# Patient Record
Sex: Male | Born: 1965 | Race: Asian | Hispanic: No | Marital: Married | Smoking: Never smoker
Health system: Southern US, Community
[De-identification: ages and names within clinical notes are randomized; demographics above are authoritative.]

## PROBLEM LIST (undated history)

## (undated) DIAGNOSIS — I714 Abdominal aortic aneurysm, without rupture, unspecified: Secondary | ICD-10-CM

## (undated) DIAGNOSIS — I10 Essential (primary) hypertension: Secondary | ICD-10-CM

## (undated) DIAGNOSIS — E785 Hyperlipidemia, unspecified: Secondary | ICD-10-CM

## (undated) DIAGNOSIS — N186 End stage renal disease: Secondary | ICD-10-CM

## (undated) DIAGNOSIS — B360 Pityriasis versicolor: Secondary | ICD-10-CM

## (undated) DIAGNOSIS — K5900 Constipation, unspecified: Secondary | ICD-10-CM

## (undated) DIAGNOSIS — Z992 Dependence on renal dialysis: Secondary | ICD-10-CM

## (undated) DIAGNOSIS — E119 Type 2 diabetes mellitus without complications: Secondary | ICD-10-CM

## (undated) DIAGNOSIS — L309 Dermatitis, unspecified: Secondary | ICD-10-CM

## (undated) HISTORY — DX: Essential (primary) hypertension: I10

## (undated) HISTORY — PX: COLONOSCOPY: SHX174

## (undated) HISTORY — DX: Abdominal aortic aneurysm, without rupture: I71.4

## (undated) HISTORY — DX: Pityriasis versicolor: B36.0

## (undated) HISTORY — DX: Abdominal aortic aneurysm, without rupture, unspecified: I71.40

## (undated) HISTORY — DX: Dermatitis, unspecified: L30.9

## (undated) HISTORY — DX: Hyperlipidemia, unspecified: E78.5

## (undated) HISTORY — PX: PARATHYROIDECTOMY: SHX19

---

## 2005-06-14 ENCOUNTER — Inpatient Hospital Stay (HOSPITAL_COMMUNITY): Admission: EM | Admit: 2005-06-14 | Discharge: 2005-06-16 | Payer: Self-pay | Admitting: Emergency Medicine

## 2005-06-15 ENCOUNTER — Ambulatory Visit: Payer: Self-pay | Admitting: Cardiology

## 2005-06-15 ENCOUNTER — Encounter: Payer: Self-pay | Admitting: Cardiology

## 2005-06-25 HISTORY — PX: AV FISTULA PLACEMENT: SHX1204

## 2005-07-19 ENCOUNTER — Ambulatory Visit (HOSPITAL_COMMUNITY): Admission: RE | Admit: 2005-07-19 | Discharge: 2005-07-19 | Payer: Self-pay | Admitting: Vascular Surgery

## 2006-12-20 ENCOUNTER — Ambulatory Visit: Payer: Self-pay | Admitting: Vascular Surgery

## 2007-01-07 ENCOUNTER — Ambulatory Visit (HOSPITAL_COMMUNITY): Admission: RE | Admit: 2007-01-07 | Discharge: 2007-01-07 | Payer: Self-pay | Admitting: Nephrology

## 2008-05-10 ENCOUNTER — Ambulatory Visit: Payer: Self-pay | Admitting: Surgery

## 2008-05-18 ENCOUNTER — Ambulatory Visit (HOSPITAL_COMMUNITY): Admission: RE | Admit: 2008-05-18 | Discharge: 2008-05-18 | Payer: Self-pay | Admitting: Surgery

## 2008-05-18 ENCOUNTER — Ambulatory Visit: Payer: Self-pay | Admitting: Surgery

## 2008-12-07 ENCOUNTER — Encounter: Payer: Self-pay | Admitting: Internal Medicine

## 2008-12-07 ENCOUNTER — Ambulatory Visit (HOSPITAL_BASED_OUTPATIENT_CLINIC_OR_DEPARTMENT_OTHER): Admission: RE | Admit: 2008-12-07 | Discharge: 2008-12-07 | Payer: Self-pay | Admitting: Nephrology

## 2008-12-12 ENCOUNTER — Ambulatory Visit: Payer: Self-pay | Admitting: Internal Medicine

## 2009-01-25 ENCOUNTER — Ambulatory Visit: Payer: Self-pay | Admitting: Internal Medicine

## 2009-01-25 DIAGNOSIS — E785 Hyperlipidemia, unspecified: Secondary | ICD-10-CM

## 2009-01-25 DIAGNOSIS — G4733 Obstructive sleep apnea (adult) (pediatric): Secondary | ICD-10-CM

## 2009-01-30 DIAGNOSIS — I10 Essential (primary) hypertension: Secondary | ICD-10-CM

## 2009-02-02 ENCOUNTER — Encounter: Payer: Self-pay | Admitting: Internal Medicine

## 2009-02-09 ENCOUNTER — Encounter: Payer: Self-pay | Admitting: Internal Medicine

## 2009-02-27 ENCOUNTER — Telehealth: Payer: Self-pay | Admitting: Internal Medicine

## 2009-03-01 ENCOUNTER — Ambulatory Visit: Payer: Self-pay | Admitting: Internal Medicine

## 2009-03-03 ENCOUNTER — Telehealth (INDEPENDENT_AMBULATORY_CARE_PROVIDER_SITE_OTHER): Payer: Self-pay | Admitting: *Deleted

## 2009-03-31 ENCOUNTER — Encounter: Payer: Self-pay | Admitting: Internal Medicine

## 2009-04-28 ENCOUNTER — Ambulatory Visit: Payer: Self-pay | Admitting: Internal Medicine

## 2009-04-28 DIAGNOSIS — N186 End stage renal disease: Secondary | ICD-10-CM | POA: Insufficient documentation

## 2009-06-22 ENCOUNTER — Encounter: Payer: Self-pay | Admitting: Internal Medicine

## 2009-09-06 ENCOUNTER — Encounter: Payer: Self-pay | Admitting: Internal Medicine

## 2009-10-26 ENCOUNTER — Telehealth: Payer: Self-pay | Admitting: Internal Medicine

## 2010-07-16 ENCOUNTER — Encounter: Payer: Self-pay | Admitting: Nephrology

## 2010-07-27 NOTE — Letter (Signed)
Summary: CMN for CPAP Supplies/[facility]  CMN for CPAP Supplies/[facility]   Imported By: Phillis Knack 09/09/2009 11:43:15  _____________________________________________________________________  External Attachment:    Type:   Image     Comment:   External Document

## 2010-07-27 NOTE — Letter (Signed)
Summary: CMN for CPAP Supplies/[facility]  CMN for CPAP Supplies/[facility]   Imported By: Phillis Knack 06/29/2009 07:59:35  _____________________________________________________________________  External Attachment:    Type:   Image     Comment:   External Document

## 2010-07-27 NOTE — Progress Notes (Signed)
Summary: nos appt  Phone Note Call from Patient   Caller: [email or web address]  Call For: Keaghan Bowens Summary of Call: ATC pt to rsc nos from 5/3, phone disconnected no other contact info available. Initial call taken by: Netta Neat,  Oct 26, 2009 10:14 AM

## 2010-11-07 NOTE — Op Note (Signed)
[name], James Brown      ACCOUNT NO.:  [id number]   MEDICAL RECORD NO.:  [id number]          PATIENT TYPE:  AMB   LOCATION:  SDS                          FACILITY:  [facility]   PHYSICIAN:  VAnnamarie Major IV, MDDATE OF BIRTH:  [birth year]   DATE OF PROCEDURE:  05/18/2008  DATE OF DISCHARGE:                               OPERATIVE REPORT   PREOPERATIVE DIAGNOSIS:  End-stage renal disease, right arm aneurysm.   POSTOPERATIVE DIAGNOSIS:  End-stage renal disease, right arm aneurysm.   PROCEDURE PERFORMED:  1. Ultrasound-guided access to the right cephalic vein fistula.  2. Fistulogram.   INDICATIONS:  This is a 45 year old gentleman who has developed aneurysm  near his arterial anastomosis, antecubital crease.  He is also having  trouble with bleeding; therefore, I felt it was prudent to proceed with  fistulogram. The risks and benefits were discussed.  Informed consent  was signed.   PROCEDURE:  The patient was identified in the holding area and taken to  the room where he was placed supine on the table.  The right arm was  prepped and draped in the standard sterile fashion.  A time-out was  called.  Lidocaine 1% was used for local anesthesia. The right-sided  vein fistula was accessed with a micropuncture needle under ultrasound  guidance.  An 0.14 wire was advanced into the vein under fluoroscopic  visualization, and a 5-French micropuncture sheath was placed.  Contrast  injections were then performed through the sheath to evaluate central  venous system, the fistula, and the arteriovenous anastomosis.   FINDINGS:  The site of fistula was widely patent throughout this course.  There was an area of the limb.  No narrowing as the cephalic vein joints  the axillary vein.  This does not appear to be hemodynamically  significant.  Multiple fluoroscopic obliquities were obtained in this  area.  There were no collaterals.  There was no central stenosis.  The  cephalic vein is  ectatic throughout this course with area of aneurysmal  degeneration at the arteriovenous anastomosis.  The arteriovenous  anastomosis was widely patent.   After the above  images were obtained,  a decision made not to perform  any intervention at this time.  Using a pursestring Woggle technique,  the sheath was removed.  The patient was taken to the holding area for  postop observation and removal of the stop-cock.   IMPRESSION:  1. No evidence of central stenosis.  2. Ectatic cephalic vein.  3. Widely patent arteriovenous anastomosis.           ______________________________  V. Leia Alf, MD  Electronically Signed     VWB/MEDQ  D:  05/18/2008  T:  05/19/2008  Job:  [id number]   cc:   Deanna Artis, MD

## 2010-11-07 NOTE — Assessment & Plan Note (Signed)
OFFICE VISIT   James Brown, James Brown  DOB:  Oct 15, 1965                                       05/10/2008  [id number]   REASON FOR VISIT:  Evaluate right arm fistula.   HISTORY:  This is a 45 year old gentleman with end-stage renal disease  who dialyzes through a right upper arm AV fistula placed by Dr. Scot Dock  in January 2007.  The patient has developed aneurysm near the arterial  anastomosis in the antecubital crease.  He states that it is no longer  being accessed in this area as the skin was thinning out.  He is having  some problems with bleeding.   PHYSICAL EXAMINATION:  Vital signs:  His blood pressure is 139/87, his  pulse is 84, he is in no acute distress.  Cardiovascular:  Regular rate,  respirations are nonlabored.  His right arm fistula shows a dilated vein  with aneurysmal degeneration at the antecubital crease.  The skin over  the top of the aneurysm is intact, there is no ulceration, he does have  ecchymosis from a prior access site.   Right arm fistula with aneurysm.   PLAN:  I would like to further evaluate this with a fistulogram to see  if there is any reason or etiology as to why the patient is developing  high pressure in his vein causing him to have aneurysmal degeneration.  At this time, since he does not have any evidence of ulceration or skin  breakdown I am inclined to continue to monitor the aneurysm.  His other  option would be to resect this area and place an interposition graft.  In any event, the graft does not need to be accessed at the site of his  aneurysm.  I am scheduling him for a fistulogram on this coming Tuesday,  November 24th.   Eldridge Abrahams, MD  Electronically Signed   VWB/MEDQ  D:  05/10/2008  T:  05/11/2008  Job:  [id number]   cc:   Jeneen Rinks L. Deterding, M.D.

## 2010-11-10 NOTE — Procedures (Signed)
James Brown, James Brown      ACCOUNT NO.:  [id number]   MEDICAL RECORD NO.:  [id number]          PATIENT TYPE:  OUT   LOCATION:  SLEEP CENTER                 FACILITY:  [facility]   PHYSICIAN:  Clinton D. Annamaria Boots, MD, FCCP, FACPDATE OF BIRTH:  01-Dec-1966   DATE OF STUDY:                            NOCTURNAL POLYSOMNOGRAM   REFERRING PHYSICIAN:  Louis Meckel, M.D.   INDICATION FOR STUDY:  Hypersomnia with sleep apnea.   EPWORTH SLEEPINESS SCORE:  Epworth sleepiness score 20/24.  BMI 32.5.  Weight 214 pounds, height 68 inches.  Neck 17 inches.   MEDICATIONS:  Home medications are charted and reviewed.   SLEEP ARCHITECTURE:  Split study protocol.  During the diagnostic phase,  total sleep time 150 minutes with sleep efficiency 69%.  Stage I was  11.6%, stage II 64.5%, stage III absent, and REM 23.9% of total sleep  time.  Sleep latency 8.5 minutes, REM latency 156.5 minutes, awake-after-  sleep onset 55 minutes, arousal index 86.5 indicating increased EEG  arousal.  No bedtime medication was taken.   RESPIRATORY DATA:  Split study protocol.  Apnea-hypopnea index (AHI)  63.8 per hour.  A total of 160 events was scored including 120  obstructive apneas, 1 mixed apnea, and 39 hypopneas.  Events were seen  in all sleep positions but more common while supine.  REM AHI 75.  CPAP  was then titrated to 20 CWP, AHI 0 per hour.  He wore a medium ResMed  Mirage Quattro full-face mask with heated humidifier.   OXYGEN DATA:  Very loud snoring with oxygen desaturation for CPAP to a  nadir of 73%.  After CPAP control, mean oxygen saturation held 96.2% on  room air.   CARDIAC DATA:  Sinus rhythm with occasional PAC.   MOVEMENT-PARASOMNIA:  Occasional limb jerk without significant effect on  sleep.  Bathroom x1.   IMPRESSIONS-RECOMMENDATIONS:  1. Severe obstructive sleep apnea/hypopnea syndrome, AHI 63.8 per      hour.  Non-positional events more commonly while supine.  Very loud   snoring with oxygen desaturation to a nadir of 73%.  2. Successful CPAP titration to 20 CWP, AHI 0 per hour.  He wore a      medium ResMed Mirage Quattro full-face mask with heated humidifier.      Clinton D. Annamaria Boots, MD, [facility], FACP  Diplomate, Tax adviser of Sleep Medicine  Electronically Signed     CDY/MEDQ  D:  12/11/2008 16:01:24  T:  12/12/2008 00:54:51  Job:  [id number]

## 2010-11-10 NOTE — Consult Note (Signed)
[name], James      ACCOUNT NO.:  [id number]   MEDICAL RECORD NO.:  [id number]          PATIENT TYPE:  INP   LOCATION:  5524                         FACILITY:  [facility]   PHYSICIAN:  Maudie Flakes. Hassell Done, M.D.   DATE OF BIRTH:  Jun 23, 1966   DATE OF CONSULTATION:  06/19/2005  DATE OF DISCHARGE:                                   CONSULTATION   HISTORY OF PRESENT ILLNESS:  Mr. Brown is a 45 year old [place] man  admitted after he was seen in urgent care June 13, 2005.  He was noted  to have marked hypertension and a creatinine of 6.  He was told to go to  [facility] for admission.  He went to urgent care because of  decreased pep an energy as well as DOE.  He thinks this is related to dust  from construction in his house and basement about two weeks ago.  No one  else at home is sick.  No rash, no arthritis, no gross hematuria, no renal  colic.  He has been healthy up until now.  He was given Toprol XL 50 mg a  day and Benicar 20/12.5 on June 13, 2005.   PAST MEDICAL HISTORY:  Unremarkable.  He saw a doctor in [place] four  years ago (Dr. Richardson Landry, phone number [phone]) for a physical.   CURRENT MEDICATIONS:  He takes Tylenol.  No NSAID.   REVIEW OF SYSTEMS:  No angina, no claudication, no melena, no hematochezia,  no foamy urine, no rash, no alopecia, no fever, no hemoptysis, no cold or  heat intolerance.   SOCIAL HISTORY:  He was born in [place].  He came to [place] in [place].  He is a  Programmer, systems.  He moved to [place] in 1988.  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  He has been married for 12  years.  He does not smoke cigarettes or drink alcohol.   FAMILY HISTORY:  His father died at [facility] age 75 of aneurysm-  brain.  His mother is alive and well at age 63.  He has a wife and two  children, a son age 3 and a daughter age 32 and they are healthy.   ALLERGIES:  None.   CURRENT  MEDICATIONS:  1.  Labetalol 100 mg b.i.d.  2.  Lasix 40 mg IV x1.   PHYSICAL EXAMINATION:  VITAL SIGNS:  Temperature 98.3, pulse 81,  respirations 18, blood pressure 120/65, blood pressure had been 174/130 on  June 12, 2005.  HEENT:  Eyes:  Pupils equal, round, and reactive to light.  Nose and mouth:  Moist mucous membranes.  No oral ulcers.  NECK:  Not stiff.  Carotids are 2+ without bruits.  CHEST:  Clear.  HEART:  No rub.  ABDOMEN:  Nontender, no organs or masses are felt.  Bowel sounds are  present.  GU:  Uncircumcised penis.  Testes presented bilaterally.  EXTREMITIES:  No edema, no rash, no arthritis.  NEUROLOGIC:  No flap.  He is ambidextrous.  He writes with his right hand.   LABORATORY DATA:  Urinalysis  by me 2+ protein, 1+ blood, 0-2 red cells,  occasional granular casts, one white cell cast.  Renal ultrasound right  kidney 8.6 cm, left kidney 11.3 cm, increased echogenicity.  Sodium 140,  potassium 4.4, chloride 113, CO2 20, BUN 70, creatinine 6.8, glucose 95.  On  June 13, 2005 BUN 62, creatinine 6.4, cholesterol 303, triglycerides  310, LDL 209, HDL 32.  PT 13.5, PTT 34 seconds.  Hemoglobin 11, WBC 9200,  platelets 259,000.  I called Dr. Horatio Pel and she said that the patient had  a blood pressure of 140/80 and creatinine 1.6 in 2002.  She says that he saw  Dr. Aaron Edelman (nephrologist) at that time.  A copy of Dr. Tawanna Sat consult  has been sent.  It shows that Mr. Chezem had 3.7 G proteinuria/24  __________ in January 2003 and serum creatinine 170.   IMPRESSION:  1.  Proteinuria.  Dipstick positive for blood.  Prior history of nephrotic      range proteinuria.  Increased serum creatinine probable chronic      __________.  2.  Hypertension.  3.  Check 24 hour urine for protein, creatinine, hepatitis B surface antigen      antibody.  Check total CK.  Get records.  Fax from [facility].  May      need renal biopsy check.  __________.  Save left arm for  vascular      access.  4.  Continue slow decrease in blood pressure.  If renal function stabilizes      and blood pressure in better control he could be discharged and come      back if renal biopsy is needed.  No pathology backup until at least      June 19, 2005.           ______________________________  Maudie Flakes. Hassell Done, M.D.     RFF/MEDQ  D:  06/19/2005  T:  06/20/2005  Job:  [id number]   cc:   Richardson Landry  Fax: [phone]   Dr. Aaron Edelman in [place]

## 2010-11-10 NOTE — Op Note (Signed)
NAMEHAORAN, STARWALT      ACCOUNT NO.:  [id number]   MEDICAL RECORD NO.:  [id number]          PATIENT TYPE:  AMB   LOCATION:  SDS                          FACILITY:  [facility]   PHYSICIAN:  Judeth Cornfield. Scot Dock, M.D.DATE OF BIRTH:  [birth year]   DATE OF PROCEDURE:  07/19/2005  DATE OF DISCHARGE:  07/19/2005                                 OPERATIVE REPORT   PREOPERATIVE DIAGNOSIS:  Chronic renal failure.   POSTOPERATIVE DIAGNOSIS:  Chronic renal failure.   PROCEDURE:  Placement of right upper arm AV fistula.   SURGEON:  Scot Dock.   TECHNIQUE:  The patient was taken to the operating room and sedated by  anesthesia. The right upper extremity was prepped and draped in usual  sterile fashion. At the skin was infiltrated with 1% lidocaine, a transverse  incision was made at the antecubital level and here the brachial artery was  dissected free.  It was a good-sized artery, although it did spasm after  being dissected out. The cephalic vein was identified and dissected free.  It was ligated distally and irrigated up nicely with heparinized saline.  It  easily took a 5 mm dilator. The patient was then heparinized. The brachial  artery was clamped proximally and distally and a longitudinal arteriotomy  was made. The vein was mobilized over and sewn end-to-side to the brachial  artery using continuous 6-0 Prolene suture. At completion there was  excellent thrill in the fistula. Hemostasis was obtained in the wound. I did  explore to make sure there were no obvious competing branches proximally.  The wound was then closed with deep layer of 3-0 Vicryl. The skin closed  with 4-0 Vicryl. Sterile dressing was applied. The patient tolerated  procedure well and was transferred to recovery in satisfactory condition.  All needle and sponge counts were correct.      Judeth Cornfield. Scot Dock, M.D.  Electronically Signed     CSD/MEDQ  D:  07/19/2005  T:  07/19/2005  Job:  [id number]

## 2010-11-10 NOTE — Discharge Summary (Signed)
James Brown, James Brown      ACCOUNT NO.:  [id number]   MEDICAL RECORD NO.:  [id number]          PATIENT TYPE:  INP   LOCATION:  [id number]                         FACILITY:  [facility]   PHYSICIAN:  Sherryl Manges, M.D.  DATE OF BIRTH:  September 01, 1965   DATE OF ADMISSION:  06/14/2005  DATE OF DISCHARGE:  06/16/2005                                 DISCHARGE SUMMARY   DISCHARGE DIAGNOSES:  1.  Accelerated hypertension/hypertensive urgency.  2.  Chronic renal failure.  3.  Nephrotic syndrome.   DISCHARGE MEDICATIONS:  1.  Normodyne (labetalol) 200 mg p.o. b.i.d.  2.  Nephro-Vite one pill p.o. every day.  3.  Fosrenol 500 mg p.o. q.a.c. t.i.d.   PROCEDURES:  1.  Chest x-ray, dated June 14, 2005.  This showed cardiac enlargement      with vascular congestion.  2.  Abdominal/pelvic CT scan, dated June 14, 2005.  This showed      bilateral pleural effusions and bibasilar atelectasis versus infiltrate,      atrophic appearing kidneys, no acute upper abdominal abnormality.  There      was nonspecific mild bladder thickening and no other pelvic abnormality.  3.  Renal ultrasound, dated June 15, 2005.  This showed medical renal      disease changes in both kidneys with the right kidney 8.6-cm in length      and left kidney 11.3-cm in length.  Bladder was unremarkable.  Volume      calculated at 240 cc.  There were bilateral pleural effusions, right      greater than left.   CONSULTATIONS:  1.  Richard F. Hassell Done, M.D., nephrologist.  2.  Sol Blazing, M.D., nephrologist.   ADMISSION HISTORY:  As in H&P notes of June 14, 2005.  However, in  brief, this is a rather pleasant 45 year old male, with presumably, no  significant past medical history, who presents with a one week history of  increasing dyspnea on exertion with orthopnea.  He went to the [facility]  urgent care center on June 13, 2005, where they found his blood pressure  to be 174/130-mmHg and physical  features consistent with mild congestive  heart failure.  He also had some electrolytes done.  Creatinine was found to  be >6.  He was referred for admission.  He was therefore subsequently  admitted for further evaluation, investigation, and management.   CLINICAL COURSE:  1.  Malignant hypertension.  This patient presents with accelerated      hypertension, with blood pressures of 174/130 and clinical features      consistent with congestive heart failure/fluid overload.  He was managed      with labetalol and a STAT dose of intravenous Lasix with excellent      clinical response.  Certainly, by June 15, 2005, his blood pressure      had become more reasonable, he had become asymptomatic, with complete      resolution of dyspnea, and on June 16, 2005, his blood pressure was      entirely well controlled at 132/69-mmHg.   1.  Renal failure.  The patient presents with a creatinine of 6.8  and a BUN      of 70 and with clinical features, suggestive of volume overload, as well      as a proteinuria.  It was felt that the patient might be nephrotic, and      that this was possibly acute on chronic renal failure.  Nephrology      consultation was called which was kindly initially provided by Dr.      Salem Senate, who got in touch with the patient's prior nephrologist and      obtained a fax record of the patient's visit to his nephrologist on      July 02, 2001.  At that time, it appears that the patient's 24-hour      urine protein was 3.703 mg and he had a creatinine of 1.7 with a BUN of      22.  At that time, it appeared a renal biopsy was contemplated, however,      the patient failed to follow up and this was the case until his recent      presentation.  Serum protein electrophoresis was done, during the      current admission, and showed a nonspecific pattern.  Renal ultrasound      showed a discrepancy in kidney sizes, with the right kidney appearing      contracted at  8.6-cm length and the left kidney normal at 11.3-cm      length.  This also showed medical renal disease changes bilaterally.      The patient's hepatitis B studies were negative.  He did have a mild      anemia of 11.7.  Iron studies showed an iron of 62, TIBC 270, percentage      saturation 23.  This is unlikely to be iron-deficiency.  It appears to      be more consistent with anemia of chronic disease.  The patient has been      started on a renal diet as well as Nephro-Vite and Fosrenol and after      discussion with Dr. Jonnie Finner in nephrology, the patient may follow up on      an outpatient basis.  He may eventually require dialysis.  We shall      defer this to the nephrologist.  It is possible that renal biopsy may      still be contemplated.  It is noted that 24-hour urinary protein      collection showed a value of 3.74 grams in 24 hours.  The patient's      albumin was 2.9.  These features are consistent with a nephrotic      syndrome.   1.  Dyslipidemia.  The patient had a total cholesterol of 303, triglycerides      310, HDL 32, LDL 209.  TSH is 1.088.  The patient is likely to benefit      from Statin treatment.  We shall therefore initiate Zocor at 20 mg p.o.      q.h.s. for now.   DISPOSITION:  The patient was discharged in satisfactory condition on  June 16, 2005.  He is expected to return to regular duties on June 23, 2005.   DIET:  Renal diet, 18-2-2, with 1200 cc per day fluid restriction.   ACTIVITY:  As tolerated.   WOUND CARE:  Not applicable.   PAIN MANAGEMENT:  Not applicable.   FOLLOWUP INSTRUCTIONS:  1.  The patient is to follow up with Dr. Hassell Done at [facility].  2.  He has been instructed to call for a lab appointment in one week and a      followup appointment in three weeks.  Telephone number has been      supplied.  This is [phone].  All this has been communicated to the      patient.  He has verbalized  understanding.     Sherryl Manges, M.D.  Electronically Signed     CO/MEDQ  D:  06/16/2005  T:  06/16/2005  Job:  [id number]   cc:   Maudie Flakes. Hassell Done, M.D.  Fax: [phone]

## 2010-11-10 NOTE — H&P (Signed)
NAMECELSO, James Brown      ACCOUNT NO.:  [id number]   MEDICAL RECORD NO.:  [id number]          PATIENT TYPE:  EMS   LOCATION:  MAJO                         FACILITY:  [facility]   PHYSICIAN:  Jonna L. Gwynneth Aliment, M.D.DATE OF BIRTH:  02-03-1966   DATE OF ADMISSION:  06/14/2005  DATE OF DISCHARGE:                                HISTORY & PHYSICAL   CHIEF COMPLAINT:  Shortness of breath.   HISTORY:  This is a 45 year old Asian male who was completely healthy until  a week ago when he started to notice dyspnea on exertion, orthopnea, but no  chest pain, blurred vision, or edema.  He went to [facility]  yesterday where they found his blood pressure was 174/130, and he was in  mild congestive heart failure.  His blood tests came back today with a  creatinine of over 6, and he was admitted.   ALLERGIES:  None.   MEDICATIONS:  They gave him Toprol XL 50 and Benicar 20/12.5.   PAST MEDICAL HISTORY:  None.   OPERATIONS:  None.   FAMILY HISTORY:  None.  The patient is adopted.   SOCIAL HISTORY:  Nonsmoker, nondrinker.  No drugs.  Runs a grocery store.  He is married, has a son.   REVIEW OF SYSTEMS:  No chest pain.  He has had nocturia x4 or 5, polydipsia.  Has very foamy urine with bubbles in it.   PHYSICAL EXAMINATION:  VITAL SIGNS:  Temperature 97, pulse 88, respirations  20, blood pressure 128/97, now down to 114/83.  GENERAL:  Well-developed, overweight Asian male.  HEENT:  Normal conjunctivae and lids.  Pupils reactive.  Extraocular  movements full.  Normal hearing and mucosa.  NECK: No mass or thyromegaly.  LUNGS:  Slightly increased respiratory effort.  Lungs have bibasilar  crackles, no dullness.  HEART:  Regular rate and rhythm.  Normal S1 and S2 without murmurs, rubs, or  gallops.  Pulses are without bruits.  There are no cyanosis, clubbing, or  edema.  ABDOMEN:  Nontender.  Normal bowel sounds.  No hepatosplenomegaly or hernia.  EXTERNAL GENITALIA:  Normal.  ADENOPATHY:  None.  NEUROLOGIC: Muscle strength is 5/5 with full range of motion all four  extremities. Cranial nerves are intact.  DTRs are 2+ and equal.  Sensation  is normal.  The patient is alert and oriented x3.  Normal memory, judgment,  and affect.  SKIN:  No rash, lesions, or nodules.   LABORATORY DATA:  BUN 70, creatinine 6.8, potassium 4.4.  BNP 1665.  Hemoglobin 12.  Troponin 0.09.  INR 1.1.   Chest x-ray shows cardiomegaly with vascular congestion, mild right lower  lobe atelectasis.   EKG shows left ventricular hypertrophy.   Total cholesterol is 303, triglycerides 310, HDL 32, LDL 209.  TSH 1.088.   IMPRESSION:  1.  Malignant hypertension.  2.  Acute renal failure.  3.  Hyperlipidemia.   This whole picture looks like nephrotic syndrome.  We will get some renal  workup.  Keep him on labetalol.  Look for secondary causes.  Nephrology has  been consulted and will see him tomorrow.  Jonna L. Gwynneth Aliment, M.D.  Electronically Signed     JLB/MEDQ  D:  06/14/2005  T:  06/16/2005  Job:  [id number]   cc:   [facility]

## 2011-02-21 ENCOUNTER — Encounter: Payer: Self-pay | Admitting: Physician Assistant

## 2011-02-28 ENCOUNTER — Ambulatory Visit (INDEPENDENT_AMBULATORY_CARE_PROVIDER_SITE_OTHER): Payer: Medicare Other | Admitting: Physician Assistant

## 2011-02-28 VITALS — BP 124/88 | HR 112

## 2011-02-28 DIAGNOSIS — N186 End stage renal disease: Secondary | ICD-10-CM

## 2011-02-28 DIAGNOSIS — Z992 Dependence on renal dialysis: Secondary | ICD-10-CM

## 2011-02-28 NOTE — Progress Notes (Signed)
Subjective:     Patient ID: James Brown, male   DOB: Jan 11, 1966, 45 y.o.   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  Fistula was placed in 2007 and has been used without difficulty since that time.  He developed several pseudoaneurysms of this approx 3 years ago.  In 2009 pt was evaled with fistulogram by Dr. Trula Slade.  This was WNL and pt was instructed to have HD stick the AVF above the pseudoaneurysm.   Since his last eval he has developed another pseudoaneurysm and now has a total of 3 large aneurysms present.   He denies bleeding issues with AVF and has HD MWF.  He denies CP, SOB, TIA/CVA sx, and GI issues.  He also denies steal in right UE.     Past Medical History  Diagnosis Date  . Chronic kidney disease   . Hypertension   . Hyperlipidemia   . Dermatitis   . Tinea versicolor   . AAA (abdominal aortic aneurysm)    Past Surgical History  Procedure Date  . Av fistula placement 06/2005    Right Arm     History  Substance Use Topics  . Smoking status: Not on file  . Smokeless tobacco: Not on file  . Alcohol Use:     No family history on file.  No Known Allergies  Current outpatient prescriptions:Calcium Carbonate Antacid (TUMS ULTRA PO), Take by mouth 3 (three) times daily.  , Disp: , Rfl: ;  cinacalcet (SENSIPAR) 90 MG tablet, Take 90 mg by mouth daily.  , Disp: , Rfl: ;  doxycycline (DORYX) 100 MG DR capsule, Take 100 mg by mouth daily.  , Disp: , Rfl: ;  fenofibrate 160 MG tablet, Take 160 mg by mouth daily.  , Disp: , Rfl: ;  GuaiFENesin (MUCINEX PO), Take by mouth as needed.  , Disp: , Rfl:  Lanthanum Carbonate (FOSRENOL PO), Take by mouth 3 (three) times daily before meals.  , Disp: , Rfl: ;  Loratadine (CLARITIN PO), Take by mouth as needed.  , Disp: , Rfl: ;  loratadine (CLARITIN) 10 MG tablet, Take 10 mg by mouth daily.  , Disp: , Rfl: ;  multivitamin (RENA-VIT) TABS tablet, Take 1 tablet by mouth daily.  , Disp: , Rfl: ;   simvastatin (ZOCOR) 80 MG tablet, Take 80 mg by mouth at bedtime.  , Disp: , Rfl:  vardenafil (LEVITRA) 20 MG tablet, Take 20 mg by mouth daily as needed.  , Disp: , Rfl:     Review of Systems  Constitutional: Negative.   HENT: Negative for nosebleeds, congestion and trouble swallowing.   Eyes: Negative.   Respiratory: Negative.   Cardiovascular: Negative.   Gastrointestinal: Negative.   Genitourinary: Negative for dysuria, hematuria and difficulty urinating.  Musculoskeletal: Negative.   Neurological: Negative.   Hematological: Negative.   Psychiatric/Behavioral: Negative for behavioral problems, confusion and agitation.       Objective:   Physical Exam  Constitutional: He is oriented to person, place, and time. He appears well-developed and well-nourished. No distress.  HENT:  Head: Normocephalic and atraumatic.  Eyes: EOM are normal. Pupils are equal, round, and reactive to light.  Neck: Normal range of motion. Neck supple.  Cardiovascular: Normal rate and regular rhythm.   Pulmonary/Chest: Effort normal and breath sounds normal. He has no wheezes.  Abdominal: Soft. Bowel sounds are normal. He exhibits no distension. There is no tenderness.  Musculoskeletal: Normal range of motion.  2+ bilateral radial pulses M/S intact B UE +thrill right upper arm AVF; 3 large pseudoaneurysms present from anticub space to prox fistula; middle one has area of thin skin; prox two are still being stuck on HD; no bleeding noted  Neurological: He is alert and oriented to person, place, and time.  Skin: Skin is warm and dry. No rash noted.  Psychiatric: He has a normal mood and affect. His behavior is normal. Judgment normal.       Assessment:    ESRD with AVF with multiple pseudoaneurysms and no new areas to stick for HD; one aneurysm with thinning skin    Plan:    F/U 1-2 weeks on Tues or Thurs for left UE vein mapping and MD f/u to discuss new HD access and ligation of right AVF.

## 2011-03-27 LAB — POCT I-STAT, CHEM 8
Calcium, Ion: 0.97 — ABNORMAL LOW
Chloride: 105
HCT: 38 — ABNORMAL LOW
Potassium: 3.8

## 2011-04-02 ENCOUNTER — Encounter: Payer: Self-pay | Admitting: Vascular Surgery

## 2011-04-03 ENCOUNTER — Ambulatory Visit: Payer: Medicare Other | Admitting: Vascular Surgery

## 2011-04-03 ENCOUNTER — Other Ambulatory Visit: Payer: Medicare Other

## 2011-04-16 ENCOUNTER — Encounter: Payer: Self-pay | Admitting: Surgery

## 2011-04-16 ENCOUNTER — Ambulatory Visit (INDEPENDENT_AMBULATORY_CARE_PROVIDER_SITE_OTHER): Payer: Medicare Other | Admitting: Surgery

## 2011-04-16 ENCOUNTER — Ambulatory Visit (INDEPENDENT_AMBULATORY_CARE_PROVIDER_SITE_OTHER): Payer: Medicare Other | Admitting: *Deleted

## 2011-04-16 VITALS — BP 111/73 | HR 105 | Resp 16 | Ht 67.0 in | Wt 235.0 lb

## 2011-04-16 DIAGNOSIS — N186 End stage renal disease: Secondary | ICD-10-CM

## 2011-04-16 DIAGNOSIS — T859XXA Unspecified complication of internal prosthetic device, implant and graft, initial encounter: Secondary | ICD-10-CM

## 2011-04-16 DIAGNOSIS — T82898A Other specified complication of vascular prosthetic devices, implants and grafts, initial encounter: Secondary | ICD-10-CM

## 2011-04-16 DIAGNOSIS — Z48812 Encounter for surgical aftercare following surgery on the circulatory system: Secondary | ICD-10-CM

## 2011-04-16 DIAGNOSIS — Z992 Dependence on renal dialysis: Secondary | ICD-10-CM

## 2011-04-16 NOTE — Progress Notes (Signed)
[facility]   Patient name: James Brown MRN: [id number] DOB: [birth year] Sex: male   Referred by: Dr. Detterding  Reason for referral:  Chief Complaint  Patient presents with  . Aneurysm    Right upper arm AVF, Placed in 2007   REF -> Dr. Jimmy Footman ,  Fistula duplex done today.    HISTORY OF PRESENT ILLNESS: The patient comes in today for evaluation of 3 aneurysmal areas in his right brachiocephalic fistula which was created in 2009. The patient states that his fistula was running well however he is having issues with the size of the vein. He wants to discuss options for repair. He denies having any swelling in that arm he denies having any pain in that arm. He denies having any active ulcerations over the fistula.  The patient  Past Medical History  Diagnosis Date  . Chronic kidney disease   . Hypertension   . Hyperlipidemia   . Dermatitis   . Tinea versicolor   . AAA (abdominal aortic aneurysm)     Past Surgical History  Procedure Date  . Av fistula placement 06/2005    Right Arm    History   Social History  . Marital Status: Married    Spouse Name: N/A    Number of Children: N/A  . Years of Education: N/A   Occupational History  . Not on file.   Social History Main Topics  . Smoking status: Never Smoker   . Smokeless tobacco: Not on file  . Alcohol Use: No  . Drug Use: No  . Sexually Active: Not on file   Other Topics Concern  . Not on file   Social History Narrative  . No narrative on file    No family history on file.  Allergies as of 04/16/2011  . (No Known Allergies)    Current Outpatient Prescriptions on File Prior to Visit  Medication Sig Dispense Refill  . Calcium Carbonate Antacid (TUMS ULTRA PO) Take by mouth 3 (three) times daily.        . cinacalcet (SENSIPAR) 90 MG tablet Take 90 mg by mouth daily.        Marland Kitchen doxycycline (DORYX) 100 MG DR capsule Take 100 mg by mouth daily.        . fenofibrate  160 MG tablet Take 160 mg by mouth daily.        . GuaiFENesin (MUCINEX PO) Take by mouth as needed.        . Lanthanum Carbonate (FOSRENOL PO) Take by mouth 3 (three) times daily before meals.        Marland Kitchen loratadine (CLARITIN) 10 MG tablet Take 10 mg by mouth daily.        . multivitamin (RENA-VIT) TABS tablet Take 1 tablet by mouth daily.        . simvastatin (ZOCOR) 80 MG tablet Take 80 mg by mouth at bedtime.        . vardenafil (LEVITRA) 20 MG tablet Take 20 mg by mouth daily as needed.           REVIEW OF SYSTEMS: Negative except as dictated in the history of present illness  PHYSICAL EXAMINATION: General: The patient appears their stated age.  Vital signs are BP 111/73  Pulse 105  Resp 16  Ht 5\' 7"  (1.702 m)  Wt 235 lb (106.595 kg)  BMI 36.81 kg/m2 Pulmonary: There is a good air exchange bilaterally Musculoskeletal: There are no major deformities.  There is no  significant extremity pain. Neurologic: No focal weakness or paresthesias are detected, Skin: There are no ulcer or rashes noted. Psychiatric: The patient has normal affect. Cardiovascular: There is a regular rate and rhythm without significant murmur appreciated. There are 3 aneurysmal areas on his fistula. 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 This would most likely need to be done in a staged fashion in order to avoid having to place a temporary dialysis catheter. The patient states that he would only like to have the one done at his antecubital crease which is the most distal  one. After discussing my plans for the operation I did mention that there was the possibility that manipulation of his fistula in repairing his aneurysms could cause the fistula to occlude. The patient did not realize this was a possibility and he would like to think about this before he proceeds. He will contact my office if he wishes to get this done the procedure would be aneurysmorrhaphy of the most distal AV fistula aneurysm at the level of the antecubital crease and incorporating the arteriovenous anastomosis   V. Leia Alf, M.D. [facility] [place] Office: [phone]

## 2011-05-07 NOTE — Procedures (Unsigned)
VASCULAR LAB EXAM  INDICATION:  Enlarged aneurysms in right brachiocephalic AV fistula.  HISTORY: Diabetes: Cardiac: Hypertension:  EXAM:  Duplex of right brachiocephalic AV fistula reveals mild disease in the proximal portion of the graft near the anastomosis. There are 3 large aneurysms measuring 2.94 cm, 3.6 cm, 2.66 cm.  Between the second and third aneurysm is an area of narrowing with increased velocities, however, no discernible plaque is present.  IMPRESSION: 1. Patent right aneurysmal arteriovenous fistula as described above. 2. Please see attached worksheet for details.  ___________________________________________ V. Leia Alf, MD  LT/MEDQ  D:  04/16/2011  T:  04/16/2011  Job:  [id number]

## 2011-07-20 ENCOUNTER — Encounter: Payer: Self-pay | Admitting: Surgery

## 2011-07-23 ENCOUNTER — Encounter: Payer: Self-pay | Admitting: Surgery

## 2011-07-23 ENCOUNTER — Ambulatory Visit (INDEPENDENT_AMBULATORY_CARE_PROVIDER_SITE_OTHER): Payer: Medicare Other | Admitting: Surgery

## 2011-07-23 VITALS — BP 104/70 | HR 112 | Temp 98.1°F | Resp 16 | Ht 67.0 in | Wt 233.0 lb

## 2011-07-23 DIAGNOSIS — T82898A Other specified complication of vascular prosthetic devices, implants and grafts, initial encounter: Secondary | ICD-10-CM | POA: Insufficient documentation

## 2011-07-23 DIAGNOSIS — N186 End stage renal disease: Secondary | ICD-10-CM

## 2011-07-23 NOTE — Progress Notes (Signed)
Vascular and Vein Specialist of [facility]   Patient name: James Brown MRN: [id number] DOB: 06-18-1966 Sex: male     Chief Complaint  Patient presents with  . Aneurysm    Right AVF aneurysm per Dr. Jimmy Footman    HISTORY OF PRESENT ILLNESS: The patient comes in today for followup of his aneurysmal fistula. His fistula was placed in 2009. The fistula was running well however he has issues with the size. We discussed proceeding with repair in October however I told him that one of the risks might be occlusion of the fistula and he wished to think about it. He's back today stating that he wants the operation. There is been no ulcerations on the skin he is not having issues with his flow rate.  Past Medical History  Diagnosis Date  . Chronic kidney disease   . Hypertension   . Hyperlipidemia   . Dermatitis   . Tinea versicolor   . AAA (abdominal aortic aneurysm)     Past Surgical History  Procedure Date  . Av fistula placement 06/2005    Right Arm    History   Social History  . Marital Status: Married    Spouse Name: N/A    Number of Children: N/A  . Years of Education: N/A   Occupational History  . Not on file.   Social History Main Topics  . Smoking status: Never Smoker   . Smokeless tobacco: Not on file  . Alcohol Use: No  . Drug Use: No  . Sexually Active: Not on file   Other Topics Concern  . Not on file   Social History Narrative  . No narrative on file    No family history on file.  Allergies as of 07/23/2011  . (No Known Allergies)    Current Outpatient Prescriptions on File Prior to Visit  Medication Sig Dispense Refill  . Calcium Carbonate Antacid (TUMS ULTRA PO) Take by mouth 3 (three) times daily.        . cinacalcet (SENSIPAR) 90 MG tablet Take 90 mg by mouth daily.        Marland Kitchen doxycycline (DORYX) 100 MG DR capsule Take 100 mg by mouth daily.        . fenofibrate 160 MG tablet Take 160 mg by mouth daily.        . GuaiFENesin (MUCINEX PO)  Take by mouth as needed.        . Lanthanum Carbonate (FOSRENOL PO) Take by mouth 3 (three) times daily before meals.        Marland Kitchen loratadine (CLARITIN) 10 MG tablet Take 10 mg by mouth daily.        . multivitamin (RENA-VIT) TABS tablet Take 1 tablet by mouth daily.        . simvastatin (ZOCOR) 80 MG tablet Take 80 mg by mouth at bedtime.        . vardenafil (LEVITRA) 20 MG tablet Take 20 mg by mouth daily as needed.           REVIEW OF SYSTEMS: No change from prior visit  PHYSICAL EXAMINATION:   Vital signs are BP 104/70  Pulse 112  Temp(Src) 98.1 F (36.7 C) (Oral)  Resp 16  Ht 5\' 7"  (1.702 m)  Wt 233 lb (105.688 kg)  BMI 36.49 kg/m2 General: The patient appears their stated age. HEENT:  No gross abnormalities Pulmonary:  Non labored breathing  Musculoskeletal: There are no major deformities. Neurologic: No focal weakness or paresthesias are detected, Skin: There  are no ulcer or rashes noted. Psychiatric: The patient has normal affect. Cardiovascular: Aneurysmal changes within the fistula no ulceration   Diagnostic Studies None  Assessment: Aneurysmal right brachiocephalic fistula Plan: I will plan on performing aneurysmorrhaphy of the aneurysmal segment near the arteriovenous anastomosis. There are additional aneurysmal areas however I do not think they can all be addressed in one operation without having to place a dialysis catheter. We have agreed to only address the 1 near the arteriovenous anastomosis. The risks and benefits were discussed with the patient. He wishes to proceed. His operation has been scheduled for Thursday, February 7   V. Leia Alf, M.D. [facility] [place] Office: [phone] Pager:  [phone]

## 2011-07-25 ENCOUNTER — Other Ambulatory Visit: Payer: Self-pay

## 2011-07-31 ENCOUNTER — Encounter (HOSPITAL_COMMUNITY): Payer: Self-pay

## 2011-08-01 MED ORDER — SODIUM CHLORIDE 0.9 % IV SOLN
INTRAVENOUS | Status: DC
  Administered 2011-08-02: 09:00:00 via INTRAVENOUS

## 2011-08-01 MED ORDER — CEFAZOLIN SODIUM-DEXTROSE 2-3 GM-% IV SOLR
2.0000 g | INTRAVENOUS | Status: AC
  Administered 2011-08-02: 2 g via INTRAVENOUS
  Filled 2011-08-01: qty 50

## 2011-08-02 ENCOUNTER — Encounter (HOSPITAL_COMMUNITY)
Admission: RE | Disposition: A | Discharge status: Home / Self Care | Payer: Self-pay | Source: Ambulatory Visit | Attending: Surgery

## 2011-08-02 ENCOUNTER — Ambulatory Visit (HOSPITAL_COMMUNITY)
Admission: RE | Admit: 2011-08-02 | Discharge: 2011-08-02 | Disposition: A | Discharge status: Home / Self Care | Payer: Medicare Other | Source: Ambulatory Visit | Attending: Surgery | Admitting: Surgery

## 2011-08-02 ENCOUNTER — Other Ambulatory Visit: Payer: Self-pay

## 2011-08-02 ENCOUNTER — Encounter (HOSPITAL_COMMUNITY): Payer: Self-pay | Admitting: Anesthesiology

## 2011-08-02 ENCOUNTER — Ambulatory Visit (HOSPITAL_COMMUNITY): Payer: Medicare Other | Admitting: Anesthesiology

## 2011-08-02 ENCOUNTER — Ambulatory Visit (HOSPITAL_COMMUNITY): Payer: Medicare Other

## 2011-08-02 DIAGNOSIS — T82898A Other specified complication of vascular prosthetic devices, implants and grafts, initial encounter: Secondary | ICD-10-CM

## 2011-08-02 DIAGNOSIS — N186 End stage renal disease: Secondary | ICD-10-CM

## 2011-08-02 DIAGNOSIS — I120 Hypertensive chronic kidney disease with stage 5 chronic kidney disease or end stage renal disease: Secondary | ICD-10-CM | POA: Insufficient documentation

## 2011-08-02 DIAGNOSIS — G4730 Sleep apnea, unspecified: Secondary | ICD-10-CM | POA: Insufficient documentation

## 2011-08-02 DIAGNOSIS — Y849 Medical procedure, unspecified as the cause of abnormal reaction of the patient, or of later complication, without mention of misadventure at the time of the procedure: Secondary | ICD-10-CM | POA: Insufficient documentation

## 2011-08-02 LAB — POCT I-STAT 4, (NA,K, GLUC, HGB,HCT): Sodium: 141 mEq/L (ref 135–145)

## 2011-08-02 LAB — SURGICAL PCR SCREEN
MRSA, PCR: NEGATIVE
Staphylococcus aureus: NEGATIVE

## 2011-08-02 SURGERY — RESECTION OF ARTERIOVENOUS FISTULA ANEURYSM
Anesthesia: Monitor Anesthesia Care | Site: Arm Upper | Laterality: Right | Wound class: Clean

## 2011-08-02 MED ORDER — LIDOCAINE-EPINEPHRINE (PF) 1 %-1:200000 IJ SOLN
INTRAMUSCULAR | Status: DC | PRN
  Administered 2011-08-02: 30 mL

## 2011-08-02 MED ORDER — SODIUM CHLORIDE 0.9 % IR SOLN
Status: DC | PRN
  Administered 2011-08-02: 10:00:00

## 2011-08-02 MED ORDER — MIDAZOLAM HCL 5 MG/5ML IJ SOLN
INTRAMUSCULAR | Status: DC | PRN
  Administered 2011-08-02: 2 mg via INTRAVENOUS

## 2011-08-02 MED ORDER — OXYCODONE HCL 5 MG PO TABS: ORAL_TABLET | ORAL | Status: DC

## 2011-08-02 MED ORDER — PHENYLEPHRINE HCL 10 MG/ML IJ SOLN
INTRAMUSCULAR | Status: DC | PRN
  Administered 2011-08-02: 80 ug via INTRAVENOUS

## 2011-08-02 MED ORDER — FENTANYL CITRATE 0.05 MG/ML IJ SOLN
INTRAMUSCULAR | Status: DC | PRN
  Administered 2011-08-02 (×2): 50 ug via INTRAVENOUS

## 2011-08-02 MED ORDER — ONDANSETRON HCL 4 MG/2ML IJ SOLN: 4.0000 mg | Freq: Four times a day (QID) | INTRAMUSCULAR | Status: DC | PRN

## 2011-08-02 MED ORDER — ONDANSETRON HCL 4 MG/2ML IJ SOLN
INTRAMUSCULAR | Status: DC | PRN
  Administered 2011-08-02: 4 mg via INTRAVENOUS

## 2011-08-02 MED ORDER — FENTANYL CITRATE 0.05 MG/ML IJ SOLN: 25.0000 ug | INTRAMUSCULAR | Status: DC | PRN

## 2011-08-02 MED ORDER — PROPOFOL 10 MG/ML IV EMUL
INTRAVENOUS | Status: DC | PRN
  Administered 2011-08-02: 100 ug/kg/min via INTRAVENOUS

## 2011-08-02 MED ORDER — 0.9 % SODIUM CHLORIDE (POUR BTL) OPTIME
TOPICAL | Status: DC | PRN
  Administered 2011-08-02: 1000 mL

## 2011-08-02 MED ORDER — LIDOCAINE-EPINEPHRINE (PF) 1 %-1:200000 IJ SOLN
INTRAMUSCULAR | Status: DC | PRN
  Administered 2011-08-02: 9 mL

## 2011-08-02 MED ORDER — SODIUM CHLORIDE 0.9 % IV SOLN
INTRAVENOUS | Status: DC | PRN
  Administered 2011-08-02: 08:00:00 via INTRAVENOUS

## 2011-08-02 MED ORDER — MUPIROCIN 2 % EX OINT
TOPICAL_OINTMENT | CUTANEOUS | Status: AC
  Administered 2011-08-02: 1 via NASAL
  Filled 2011-08-02: qty 22

## 2011-08-02 SURGICAL SUPPLY — 38 items
ADH SKN CLS APL DERMABOND .7 (GAUZE/BANDAGES/DRESSINGS) ×1
CANISTER SUCTION 2500CC (MISCELLANEOUS) ×2 IMPLANT
CLIP TI MEDIUM 6 (CLIP) ×2 IMPLANT
CLIP TI WIDE RED SMALL 6 (CLIP) ×2 IMPLANT
CLOTH BEACON ORANGE TIMEOUT ST (SAFETY) ×2 IMPLANT
COVER PROBE W GEL 5X96 (DRAPES) ×2 IMPLANT
COVER SURGICAL LIGHT HANDLE (MISCELLANEOUS) ×4 IMPLANT
DERMABOND ADVANCED (GAUZE/BANDAGES/DRESSINGS) ×1
DERMABOND ADVANCED .7 DNX12 (GAUZE/BANDAGES/DRESSINGS) ×1 IMPLANT
ELECT REM PT RETURN 9FT ADLT (ELECTROSURGICAL) ×2
ELECTRODE REM PT RTRN 9FT ADLT (ELECTROSURGICAL) ×1 IMPLANT
GLOVE BIO SURGEON STRL SZ 6.5 (GLOVE) ×1 IMPLANT
GLOVE BIO SURGEON STRL SZ8 (GLOVE) ×1 IMPLANT
GLOVE BIOGEL PI IND STRL 7.0 (GLOVE) IMPLANT
GLOVE BIOGEL PI IND STRL 7.5 (GLOVE) ×1 IMPLANT
GLOVE BIOGEL PI INDICATOR 7.0 (GLOVE) ×2
GLOVE BIOGEL PI INDICATOR 7.5 (GLOVE) ×2
GLOVE SURG SS PI 7.5 STRL IVOR (GLOVE) ×3 IMPLANT
GOWN PREVENTION PLUS XLARGE (GOWN DISPOSABLE) ×1 IMPLANT
GOWN PREVENTION PLUS XXLARGE (GOWN DISPOSABLE) ×2 IMPLANT
GOWN STRL NON-REIN LRG LVL3 (GOWN DISPOSABLE) ×4 IMPLANT
HEMOSTAT SNOW SURGICEL 2X4 (HEMOSTASIS) IMPLANT
HEMOSTAT SURGICEL 2X14 (HEMOSTASIS) IMPLANT
KIT BASIN OR (CUSTOM PROCEDURE TRAY) ×2 IMPLANT
KIT ROOM TURNOVER OR (KITS) ×2 IMPLANT
NS IRRIG 1000ML POUR BTL (IV SOLUTION) ×2 IMPLANT
PACK CV ACCESS (CUSTOM PROCEDURE TRAY) ×2 IMPLANT
PAD ARMBOARD 7.5X6 YLW CONV (MISCELLANEOUS) ×4 IMPLANT
SUT PROLENE 5 0 C 1 24 (SUTURE) ×4 IMPLANT
SUT PROLENE 6 0 BV (SUTURE) ×1 IMPLANT
SUT PROLENE 6 0 CC (SUTURE) ×2 IMPLANT
SUT VIC AB 3-0 SH 27 (SUTURE) ×4
SUT VIC AB 3-0 SH 27X BRD (SUTURE) ×1 IMPLANT
SUT VICRYL 4-0 PS2 18IN ABS (SUTURE) IMPLANT
TOWEL OR 17X24 6PK STRL BLUE (TOWEL DISPOSABLE) ×2 IMPLANT
TOWEL OR 17X26 10 PK STRL BLUE (TOWEL DISPOSABLE) ×2 IMPLANT
UNDERPAD 30X30 INCONTINENT (UNDERPADS AND DIAPERS) ×2 IMPLANT
WATER STERILE IRR 1000ML POUR (IV SOLUTION) ×2 IMPLANT

## 2011-08-02 NOTE — Anesthesia Postprocedure Evaluation (Signed)
Anesthesia Post Note  Patient: James Brown  Procedure(s) Performed:  RESECTION OF ARTERIOVENOUS FISTULA ANEURYSM - Resection right arteriovenous fistula aneurysm  Anesthesia type: General  Patient location: PACU  Post pain: Pain level controlled and Adequate analgesia  Post assessment: Post-op Vital signs reviewed, Patient's Cardiovascular Status Stable, Respiratory Function Stable, Patent Airway and Pain level controlled  Last Vitals:  Filed Vitals:   08/02/11 1115  BP:   Pulse: 81  Temp: 36.1 C  Resp: 11    Post vital signs: Reviewed and stable  Level of consciousness: awake, alert  and oriented  Complications: No apparent anesthesia complications

## 2011-08-02 NOTE — Preoperative (Signed)
Beta Blockers   Reason not to administer Beta Blockers:Not Applicable 

## 2011-08-02 NOTE — Transfer of Care (Signed)
Immediate Anesthesia Transfer of Care Note  Patient: James Brown  Procedure(s) Performed:  RESECTION OF ARTERIOVENOUS FISTULA ANEURYSM - Resection right arteriovenous fistula aneurysm  Patient Location: PACU  Anesthesia Type: MAC  Level of Consciousness: awake, alert , oriented and patient cooperative  Airway & Oxygen Therapy: Patient Spontanous Breathing and Patient connected to face mask oxygen  Post-op Assessment: Report given to PACU RN, Post -op Vital signs reviewed and stable and Patient moving all extremities  Post vital signs: Reviewed and stable  Complications: No apparent anesthesia complications

## 2011-08-02 NOTE — H&P (View-Only) (Signed)
Vascular and Vein Specialist of [facility]   Patient name: James Brown MRN: [id number] DOB: Jan 20, 1966 Sex: male     Chief Complaint  Patient presents with  . Aneurysm    Right AVF aneurysm per Dr. Jimmy Footman    HISTORY OF PRESENT ILLNESS: The patient comes in today for followup of his aneurysmal fistula. His fistula was placed in 2009. The fistula was running well however he has issues with the size. We discussed proceeding with repair in October however I told him that one of the risks might be occlusion of the fistula and he wished to think about it. He's back today stating that he wants the operation. There is been no ulcerations on the skin he is not having issues with his flow rate.  Past Medical History  Diagnosis Date  . Chronic kidney disease   . Hypertension   . Hyperlipidemia   . Dermatitis   . Tinea versicolor   . AAA (abdominal aortic aneurysm)     Past Surgical History  Procedure Date  . Av fistula placement 06/2005    Right Arm    History   Social History  . Marital Status: Married    Spouse Name: N/A    Number of Children: N/A  . Years of Education: N/A   Occupational History  . Not on file.   Social History Main Topics  . Smoking status: Never Smoker   . Smokeless tobacco: Not on file  . Alcohol Use: No  . Drug Use: No  . Sexually Active: Not on file   Other Topics Concern  . Not on file   Social History Narrative  . No narrative on file    No family history on file.  Allergies as of 07/23/2011  . (No Known Allergies)    Current Outpatient Prescriptions on File Prior to Visit  Medication Sig Dispense Refill  . Calcium Carbonate Antacid (TUMS ULTRA PO) Take by mouth 3 (three) times daily.        . cinacalcet (SENSIPAR) 90 MG tablet Take 90 mg by mouth daily.        Marland Kitchen doxycycline (DORYX) 100 MG DR capsule Take 100 mg by mouth daily.        . fenofibrate 160 MG tablet Take 160 mg by mouth daily.        . GuaiFENesin (MUCINEX PO)  Take by mouth as needed.        . Lanthanum Carbonate (FOSRENOL PO) Take by mouth 3 (three) times daily before meals.        Marland Kitchen loratadine (CLARITIN) 10 MG tablet Take 10 mg by mouth daily.        . multivitamin (RENA-VIT) TABS tablet Take 1 tablet by mouth daily.        . simvastatin (ZOCOR) 80 MG tablet Take 80 mg by mouth at bedtime.        . vardenafil (LEVITRA) 20 MG tablet Take 20 mg by mouth daily as needed.           REVIEW OF SYSTEMS: No change from prior visit  PHYSICAL EXAMINATION:   Vital signs are BP 104/70  Pulse 112  Temp(Src) 98.1 F (36.7 C) (Oral)  Resp 16  Ht 5\' 7"  (1.702 m)  Wt 233 lb (105.688 kg)  BMI 36.49 kg/m2 General: The patient appears their stated age. HEENT:  No gross abnormalities Pulmonary:  Non labored breathing  Musculoskeletal: There are no major deformities. Neurologic: No focal weakness or paresthesias are detected, Skin: There  are no ulcer or rashes noted. Psychiatric: The patient has normal affect. Cardiovascular: Aneurysmal changes within the fistula no ulceration   Diagnostic Studies None  Assessment: Aneurysmal right brachiocephalic fistula Plan: I will plan on performing aneurysmorrhaphy of the aneurysmal segment near the arteriovenous anastomosis. There are additional aneurysmal areas however I do not think they can all be addressed in one operation without having to place a dialysis catheter. We have agreed to only address the 1 near the arteriovenous anastomosis. The risks and benefits were discussed with the patient. He wishes to proceed. His operation has been scheduled for Thursday, February 7   V. Leia Alf, M.D. [facility] [place] Office: [phone] Pager:  [phone]

## 2011-08-02 NOTE — Op Note (Signed)
OPERATIVE REPORT  Date of Surgery: 08/02/2011  Surgeon: [name], MD  Assistant: Wray Kearns, PA Pre-op Diagnosis: End Stage Renal Diseasewith the aneurysms right upper arm AV fistula Post-op Diagnosis: side Procedure: Procedure(s): RESECTION OF ARTERIOVENOUS FISTULA ANEURYSM right upper arm,( brachial-cephalic) with aneurysmorrhaphy Anesthesia: Matt  EBL: minimal  Complications: None  Procedure Details:is resected to the operative reports in the supine position at which the right upper extremity was prepped with Betadine scrub and solution draped in routine sterile manner. There was a nicely functioning AV fistula in the right upper arm (brachial-cephalic) which had 3 areas of aneurysmal dilatation. The largest and most significant was the one extending from near the arterial anastomosis about a far away proximally up the arm. Was about 10-15 cm in length and about 3-4 cm in diameter. After infiltration with 1% Xylocaine an elliptical incision was made over this long area to excise redundant skin. This was carried down through subcutaneous tissue using the scalpel down through the aneurysm itself. The elliptical skin was excised. The aneurysm was then mobilized proximally and distally for control and laterally in order to excise a portion of the anterior wall. Following this the the fistula was occluded proximally without interrupting flow to the distal upper extremity and distally with vascular clamps. About 50% of the anterior wall of the fistula was excised in an elliptical fashion leaving a satisfactory lumen. There were no patent side branches. The anterior portion of the fistula was then closed using a continuous 5-0 Prolene suture leaving a quite satisfactory lumen. Following appropriate flushing as well as the clamps with good size match of the gland proximally and distally. Adequate hemostasis was achieved and the wound closed in layers with Vicryl in subcuticular fashion. There were  2 further aneurysms more proximally in the arm which will have to be treated at a later date so that there was an area for accessing the gland for hemodialysis. There was a good radial pulse palpable distally. Patient taken to the recovery room in satisfactory condition.   [name], MD 08/02/2011 10:49 AM

## 2011-08-02 NOTE — Anesthesia Preprocedure Evaluation (Addendum)
Anesthesia Evaluation  Patient identified by MRN, date of birth, ID band Patient awake    Reviewed: Allergy & Precautions, H&P , NPO status , Patient's Chart, lab work & pertinent test results, reviewed documented beta blocker date and time   Airway Mallampati: II  Neck ROM: full    Dental  (+) Teeth Intact and Dental Advisory Given   Pulmonary sleep apnea ,          Cardiovascular hypertension,     Neuro/Psych    GI/Hepatic   Endo/Other    Renal/GU ESRFRenal disease     Musculoskeletal   Abdominal   Peds  Hematology   Anesthesia Other Findings   Reproductive/Obstetrics                          Anesthesia Physical Anesthesia Plan  ASA: III  Anesthesia Plan: General and MAC   Post-op Pain Management:    Induction: Intravenous  Airway Management Planned: Simple Face Mask  Additional Equipment:   Intra-op Plan:   Post-operative Plan:   Informed Consent: I have reviewed the patients History and Physical, chart, labs and discussed the procedure including the risks, benefits and alternatives for the proposed anesthesia with the patient or authorized representative who has indicated his/her understanding and acceptance.     Plan Discussed with: CRNA and Surgeon  Anesthesia Plan Comments:         Anesthesia Quick Evaluation

## 2011-08-02 NOTE — Interval H&P Note (Signed)
History and Physical Interval Note:  08/02/2011 9:18 AM  James Brown  has presented today for surgery, with the diagnosis of End Stage Renal Disease  The various methods of treatment have been discussed with the patient and family. After consideration of risks, benefits and other options for treatment, the patient has consented to  Procedure(s): RESECTION OF ARTERIOVENOUS FISTULA ANEURYSM as a surgical intervention .  The patients' history has been reviewed, patient examined, no change in status, stable for surgery.  I have reviewed the patients' chart and labs.  Questions were answered to the patient's satisfaction.     BRABHAM IV, V. WELLS

## 2011-08-02 NOTE — Interval H&P Note (Signed)
History and Physical Interval Note:  08/02/2011 9:23 AM  James Brown  has presented today for surgery, with the diagnosis of End Stage Renal Disease  The various methods of treatment have been discussed with the patient and family. After consideration of risks, benefits and other options for treatment, the patient has consented to  Procedure(s): RESECTION OF ARTERIOVENOUS FISTULA ANEURYSM as a surgical intervention .  The patients' history has been reviewed, patient examined, no change in status, stable for surgery.  I have reviewed the patients' chart and labs.  Questions were answered to the patient's satisfaction.     [name]

## 2012-10-09 ENCOUNTER — Ambulatory Visit (INDEPENDENT_AMBULATORY_CARE_PROVIDER_SITE_OTHER): Payer: Medicare Other | Admitting: Surgery

## 2012-10-09 ENCOUNTER — Encounter (INDEPENDENT_AMBULATORY_CARE_PROVIDER_SITE_OTHER): Payer: Self-pay | Admitting: Surgery

## 2012-10-09 VITALS — BP 130/84 | HR 98 | Temp 98.0°F | Resp 16 | Ht 67.0 in | Wt 232.6 lb

## 2012-10-09 DIAGNOSIS — N2581 Secondary hyperparathyroidism of renal origin: Secondary | ICD-10-CM

## 2012-10-09 DIAGNOSIS — N186 End stage renal disease: Secondary | ICD-10-CM

## 2012-10-09 NOTE — Progress Notes (Signed)
General Surgery [facility]  Chief Complaint  Patient presents with  . New Evaluation    secondary hyperparathyroidism, ESRD - referral from Dr. Jeneen Rinks Deterding    HISTORY: Patient is a 47 year old male referred by his nephrologist for consideration of total parathyroidectomy for management of secondary hyperparathyroidism. Patient has end-stage renal disease and is on hemodialysis. He dialyzes at the [facility] street facility on Mondays Wednesdays and Fridays. He currently has a fistula in the right upper extremity.  Patient has intact PTH levels of nearly 2200. Calcium is in the upper range of normal at 10.0. Phosphorus is elevated at 8.5. Patient attempted to take Sensipar but had gastrointestinal side effects. He has not been compliant with his phosphorus lining agents.  Patient has had no prior head or neck surgery. He does note muscle weakness and fatigue.  Past Medical History  Diagnosis Date  . Hypertension   . Hyperlipidemia   . Dermatitis   . Tinea versicolor   . AAA (abdominal aortic aneurysm)   . Chronic kidney disease     M, W, F [facility]      Current Outpatient Prescriptions  Medication Sig Dispense Refill  . Calcium Carbonate Antacid (TUMS ULTRA PO) Take by mouth 3 (three) times daily.        . cinacalcet (SENSIPAR) 90 MG tablet Take 90 mg by mouth daily.        Marland Kitchen doxycycline (DORYX) 100 MG DR capsule Take 100 mg by mouth daily.        . fenofibrate 160 MG tablet Take 160 mg by mouth daily.        . GuaiFENesin (MUCINEX PO) Take by mouth as needed.        . Lanthanum Carbonate (FOSRENOL PO) Take by mouth 3 (three) times daily before meals.        Marland Kitchen loratadine (CLARITIN) 10 MG tablet Take 10 mg by mouth daily.        . multivitamin (RENA-VIT) TABS tablet Take 1 tablet by mouth daily.        Marland Kitchen oxyCODONE (ROXICODONE) 5 MG immediate release tablet 1-2 tabs every 4 hours as needed for pain  30 tablet  0  . simvastatin (ZOCOR) 80 MG  tablet Take 80 mg by mouth at bedtime.        . vardenafil (LEVITRA) 20 MG tablet Take 20 mg by mouth daily as needed.         No current facility-administered medications for this visit.     No Known Allergies   Family History  Problem Relation Age of Onset  . Family history unknown: Yes     History   Social History  . Marital Status: Married    Spouse Name: N/A    Number of Children: N/A  . Years of Education: N/A   Social History Main Topics  . Smoking status: Never Smoker   . Smokeless tobacco: None  . Alcohol Use: No  . Drug Use: No  . Sexually Active: None   Other Topics Concern  . None   Social History Narrative  . None     REVIEW OF SYSTEMS - PERTINENT POSITIVES ONLY: Muscle weakness. Bone and joint discomfort. Chronic fatigue.  EXAM: Filed Vitals:   10/09/12 1136  BP: 130/84  Pulse: 98  Temp: 98 F (36.7 C)  Resp: 16    HEENT: normocephalic; pupils equal and reactive; sclerae clear; dentition good; mucous membranes moist NECK:  No palpable masses in the  thyroid bed; symmetric on extension; no palpable anterior or posterior cervical lymphadenopathy; no supraclavicular masses; no tenderness CHEST: clear to auscultation bilaterally without rales, rhonchi, or wheezes CARDIAC: regular rate and rhythm without significant murmur; peripheral pulses are full EXT:  non-tender without edema; no deformity; fistula in right upper arm, patent NEURO: no gross focal deficits; no sign of tremor   LABORATORY RESULTS: See [facility] (CHL-Epic) for most recent results   RADIOLOGY RESULTS: See [facility] (CHL-Epic) for most recent results   IMPRESSION: #1 secondary hyperparathyroidism #2 end-stage renal disease secondary to chronic nephritis  PLAN: I had a lengthy discussion with the patient regarding secondary hyperparathyroidism and its management with total parathyroidectomy. We discussed the surgical procedure of total parathyroidectomy with  autotransplantation to the left forearm. We discussed risk and benefits of the procedure including injury to recurrent laryngeal nerves and the possibility of recurrent disease regarding further surgical procedures. We discussed his hospitalization and his postoperative recovery. He understands and wishes to proceed. We will make arrangements in conjunction with the nephrology service for surgery in the near future.  The risks and benefits of the procedure have been discussed at length with the patient.  The patient understands the proposed procedure, potential alternative treatments, and the course of recovery to be expected.  All of the patient's questions have been answered at this time.  The patient wishes to proceed with surgery.  Earnstine Regal, MD, [facility]   Visit Diagnoses: 1. Hyperparathyroidism, secondary   2. ESRD     Primary Care Physician: Placido Sou, MD

## 2012-10-09 NOTE — Patient Instructions (Signed)

## 2012-10-14 ENCOUNTER — Encounter (HOSPITAL_COMMUNITY): Payer: Self-pay | Admitting: Pharmacy Technician

## 2012-10-20 MED ORDER — DEXTROSE 5 % IV SOLN
3.0000 g | INTRAVENOUS | Status: AC
  Administered 2012-10-21: 3 g via INTRAVENOUS
  Filled 2012-10-20: qty 3000

## 2012-10-20 NOTE — Progress Notes (Signed)
Numerous call's made to patient's number, no answer, voice mail full.

## 2012-10-21 ENCOUNTER — Encounter (HOSPITAL_COMMUNITY): Payer: Self-pay | Admitting: Anesthesiology

## 2012-10-21 ENCOUNTER — Inpatient Hospital Stay (HOSPITAL_COMMUNITY): Payer: Medicare Other | Admitting: Anesthesiology

## 2012-10-21 ENCOUNTER — Encounter (HOSPITAL_COMMUNITY)
Admission: RE | Disposition: A | Discharge status: Home / Self Care | Payer: Self-pay | Source: Ambulatory Visit | Attending: Surgery

## 2012-10-21 ENCOUNTER — Inpatient Hospital Stay (HOSPITAL_COMMUNITY)
Admission: RE | Admit: 2012-10-21 | Discharge: 2012-10-24 | DRG: 673 | Disposition: A | Discharge status: Home / Self Care | Payer: Medicare Other | Source: Ambulatory Visit | Attending: Surgery | Admitting: Surgery

## 2012-10-21 ENCOUNTER — Inpatient Hospital Stay (HOSPITAL_COMMUNITY): Payer: Medicare Other

## 2012-10-21 DIAGNOSIS — D649 Anemia, unspecified: Secondary | ICD-10-CM | POA: Diagnosis present

## 2012-10-21 DIAGNOSIS — N2581 Secondary hyperparathyroidism of renal origin: Principal | ICD-10-CM | POA: Diagnosis present

## 2012-10-21 DIAGNOSIS — N186 End stage renal disease: Secondary | ICD-10-CM

## 2012-10-21 DIAGNOSIS — I714 Abdominal aortic aneurysm, without rupture, unspecified: Secondary | ICD-10-CM | POA: Diagnosis present

## 2012-10-21 DIAGNOSIS — I120 Hypertensive chronic kidney disease with stage 5 chronic kidney disease or end stage renal disease: Secondary | ICD-10-CM | POA: Diagnosis present

## 2012-10-21 DIAGNOSIS — E1129 Type 2 diabetes mellitus with other diabetic kidney complication: Secondary | ICD-10-CM

## 2012-10-21 DIAGNOSIS — Z992 Dependence on renal dialysis: Secondary | ICD-10-CM

## 2012-10-21 DIAGNOSIS — E785 Hyperlipidemia, unspecified: Secondary | ICD-10-CM | POA: Diagnosis present

## 2012-10-21 DIAGNOSIS — N039 Chronic nephritic syndrome with unspecified morphologic changes: Secondary | ICD-10-CM | POA: Diagnosis present

## 2012-10-21 DIAGNOSIS — E1310 Other specified diabetes mellitus with ketoacidosis without coma: Secondary | ICD-10-CM | POA: Diagnosis present

## 2012-10-21 HISTORY — DX: End stage renal disease: N18.6

## 2012-10-21 HISTORY — DX: Type 2 diabetes mellitus without complications: E11.9

## 2012-10-21 HISTORY — PX: PARATHYROIDECTOMY: SHX19

## 2012-10-21 HISTORY — DX: Dependence on renal dialysis: Z99.2

## 2012-10-21 LAB — BASIC METABOLIC PANEL
BUN: 62 mg/dL — ABNORMAL HIGH (ref 6–23)
CO2: 25 mEq/L (ref 19–32)
Chloride: 92 mEq/L — ABNORMAL LOW (ref 96–112)
Creatinine, Ser: 9.64 mg/dL — ABNORMAL HIGH (ref 0.50–1.35)
Potassium: 3.3 mEq/L — ABNORMAL LOW (ref 3.5–5.1)

## 2012-10-21 LAB — CBC
HCT: 35 % — ABNORMAL LOW (ref 39.0–52.0)
Hemoglobin: 11.9 g/dL — ABNORMAL LOW (ref 13.0–17.0)
MCV: 77.6 fL — ABNORMAL LOW (ref 78.0–100.0)
RDW: 13.1 % (ref 11.5–15.5)
WBC: 9.9 10*3/uL (ref 4.0–10.5)

## 2012-10-21 LAB — RENAL FUNCTION PANEL
Albumin: 3.3 g/dL — ABNORMAL LOW (ref 3.5–5.2)
BUN: 66 mg/dL — ABNORMAL HIGH (ref 6–23)
CO2: 24 mEq/L (ref 19–32)
Calcium: 9.5 mg/dL (ref 8.4–10.5)
Chloride: 94 mEq/L — ABNORMAL LOW (ref 96–112)
Creatinine, Ser: 10.22 mg/dL — ABNORMAL HIGH (ref 0.50–1.35)
GFR calc Af Amer: 6 mL/min — ABNORMAL LOW (ref >90)
GFR calc non Af Amer: 5 mL/min — ABNORMAL LOW (ref >90)
Glucose, Bld: 167 mg/dL — ABNORMAL HIGH (ref 70–99)
Phosphorus: 8.6 mg/dL — ABNORMAL HIGH (ref 2.3–4.6)
Potassium: 3.9 mEq/L (ref 3.5–5.1)
Sodium: 137 mEq/L (ref 135–145)

## 2012-10-21 SURGERY — PARATHYROIDECTOMY, WITH AUTOGRAFT TRANSPLANT
Anesthesia: General | Laterality: Left | Wound class: Clean

## 2012-10-21 MED ORDER — ARTIFICIAL TEARS OP OINT
TOPICAL_OINTMENT | OPHTHALMIC | Status: DC | PRN
  Administered 2012-10-21: 1 via OPHTHALMIC

## 2012-10-21 MED ORDER — MUPIROCIN 2 % EX OINT
TOPICAL_OINTMENT | Freq: Two times a day (BID) | CUTANEOUS | Status: DC
  Administered 2012-10-21: 1 via NASAL
  Administered 2012-10-21 – 2012-10-24 (×6): via NASAL
  Filled 2012-10-21 (×2): qty 22

## 2012-10-21 MED ORDER — HYDROMORPHONE HCL PF 1 MG/ML IJ SOLN
0.2500 mg | INTRAMUSCULAR | Status: DC | PRN
  Administered 2012-10-21 (×5): 0.25 mg via INTRAVENOUS

## 2012-10-21 MED ORDER — HYDROCODONE-ACETAMINOPHEN 5-325 MG PO TABS
1.0000 | ORAL_TABLET | ORAL | Status: DC | PRN
  Administered 2012-10-22 (×2): 2 via ORAL
  Filled 2012-10-21 (×2): qty 2

## 2012-10-21 MED ORDER — HYDROMORPHONE HCL PF 1 MG/ML IJ SOLN
INTRAMUSCULAR | Status: AC
  Filled 2012-10-21: qty 1

## 2012-10-21 MED ORDER — HEMOSTATIC AGENTS (NO CHARGE) OPTIME
TOPICAL | Status: DC | PRN
  Administered 2012-10-21: 1 via TOPICAL

## 2012-10-21 MED ORDER — ROCURONIUM BROMIDE 100 MG/10ML IV SOLN
INTRAVENOUS | Status: DC | PRN
  Administered 2012-10-21: 30 mg via INTRAVENOUS
  Administered 2012-10-21 (×2): 10 mg via INTRAVENOUS

## 2012-10-21 MED ORDER — 0.9 % SODIUM CHLORIDE (POUR BTL) OPTIME
TOPICAL | Status: DC | PRN
  Administered 2012-10-21: 1000 mL

## 2012-10-21 MED ORDER — DEXTROSE 5 % IV SOLN
INTRAVENOUS | Status: DC | PRN
  Administered 2012-10-21: 12:00:00 via INTRAVENOUS

## 2012-10-21 MED ORDER — SODIUM CHLORIDE 0.9 % IV SOLN
INTRAVENOUS | Status: DC
  Administered 2012-10-21: 20 mL/h via INTRAVENOUS

## 2012-10-21 MED ORDER — CALCIUM CARBONATE 1250 MG/5ML PO SUSP
1000.0000 mg | Freq: Three times a day (TID) | ORAL | Status: DC
  Administered 2012-10-21 – 2012-10-24 (×9): 1000 mg via ORAL
  Filled 2012-10-21 (×5): qty 10
  Filled 2012-10-21: qty 5
  Filled 2012-10-21 (×8): qty 10

## 2012-10-21 MED ORDER — ONDANSETRON HCL 4 MG/2ML IJ SOLN
INTRAMUSCULAR | Status: DC | PRN
  Administered 2012-10-21: 4 mg via INTRAVENOUS

## 2012-10-21 MED ORDER — HYDROMORPHONE HCL PF 1 MG/ML IJ SOLN
1.0000 mg | INTRAMUSCULAR | Status: DC | PRN
  Administered 2012-10-21 – 2012-10-22 (×3): 1 mg via INTRAVENOUS
  Filled 2012-10-21 (×3): qty 1

## 2012-10-21 MED ORDER — PHENYLEPHRINE HCL 10 MG/ML IJ SOLN
INTRAMUSCULAR | Status: DC | PRN
  Administered 2012-10-21 (×2): 40 ug via INTRAVENOUS
  Administered 2012-10-21: 80 ug via INTRAVENOUS
  Administered 2012-10-21 (×4): 40 ug via INTRAVENOUS
  Administered 2012-10-21: 80 ug via INTRAVENOUS
  Administered 2012-10-21: 40 ug via INTRAVENOUS
  Administered 2012-10-21 (×2): 80 ug via INTRAVENOUS

## 2012-10-21 MED ORDER — ACETAMINOPHEN 325 MG PO TABS: 650.0000 mg | ORAL_TABLET | ORAL | Status: DC | PRN

## 2012-10-21 MED ORDER — FENTANYL CITRATE 0.05 MG/ML IJ SOLN
INTRAMUSCULAR | Status: DC | PRN
  Administered 2012-10-21 (×2): 100 ug via INTRAVENOUS
  Administered 2012-10-21 (×4): 50 ug via INTRAVENOUS

## 2012-10-21 MED ORDER — SUCCINYLCHOLINE CHLORIDE 20 MG/ML IJ SOLN
INTRAMUSCULAR | Status: DC | PRN
  Administered 2012-10-21: 130 mg via INTRAVENOUS

## 2012-10-21 MED ORDER — MUPIROCIN 2 % EX OINT
TOPICAL_OINTMENT | CUTANEOUS | Status: AC
  Filled 2012-10-21: qty 22

## 2012-10-21 MED ORDER — GLYCOPYRROLATE 0.2 MG/ML IJ SOLN
INTRAMUSCULAR | Status: DC | PRN
  Administered 2012-10-21: 0.6 mg via INTRAVENOUS

## 2012-10-21 MED ORDER — ONDANSETRON HCL 4 MG PO TABS: 4.0000 mg | ORAL_TABLET | Freq: Four times a day (QID) | ORAL | Status: DC | PRN

## 2012-10-21 MED ORDER — LIDOCAINE HCL (CARDIAC) 20 MG/ML IV SOLN
INTRAVENOUS | Status: DC | PRN
  Administered 2012-10-21: 50 mg via INTRAVENOUS

## 2012-10-21 MED ORDER — PROPOFOL 10 MG/ML IV BOLUS
INTRAVENOUS | Status: DC | PRN
  Administered 2012-10-21: 30 mg via INTRAVENOUS
  Administered 2012-10-21: 200 mg via INTRAVENOUS
  Administered 2012-10-21: 30 mg via INTRAVENOUS
  Administered 2012-10-21: 20 mg via INTRAVENOUS

## 2012-10-21 MED ORDER — ONDANSETRON HCL 4 MG/2ML IJ SOLN: 4.0000 mg | Freq: Once | INTRAMUSCULAR | Status: DC | PRN

## 2012-10-21 MED ORDER — NEOSTIGMINE METHYLSULFATE 1 MG/ML IJ SOLN
INTRAMUSCULAR | Status: DC | PRN
  Administered 2012-10-21: 4 mg via INTRAVENOUS

## 2012-10-21 MED ORDER — SODIUM CHLORIDE 0.9 % IV SOLN
INTRAVENOUS | Status: DC | PRN
  Administered 2012-10-21 (×3): via INTRAVENOUS

## 2012-10-21 MED ORDER — DOXERCALCIFEROL 4 MCG/2ML IV SOLN
6.0000 ug | INTRAVENOUS | Status: DC
  Administered 2012-10-22: 6 ug via INTRAVENOUS
  Filled 2012-10-21 (×2): qty 4

## 2012-10-21 MED ORDER — SODIUM CHLORIDE 0.9 % IV SOLN
INTRAVENOUS | Status: DC
  Administered 2012-10-21: 35 mL/h via INTRAVENOUS

## 2012-10-21 MED ORDER — ONDANSETRON HCL 4 MG/2ML IJ SOLN: 4.0000 mg | Freq: Four times a day (QID) | INTRAMUSCULAR | Status: DC | PRN

## 2012-10-21 SURGICAL SUPPLY — 56 items
APL SKNCLS STERI-STRIP NONHPOA (GAUZE/BANDAGES/DRESSINGS) ×1
BANDAGE ELASTIC 4 VELCRO ST LF (GAUZE/BANDAGES/DRESSINGS) ×1 IMPLANT
BANDAGE GAUZE ELAST BULKY 4 IN (GAUZE/BANDAGES/DRESSINGS) IMPLANT
BENZOIN TINCTURE PRP APPL 2/3 (GAUZE/BANDAGES/DRESSINGS) ×3 IMPLANT
BLADE SURG 15 STRL LF DISP TIS (BLADE) ×3 IMPLANT
BLADE SURG 15 STRL SS (BLADE) ×6
BLADE SURG ROTATE 9660 (MISCELLANEOUS) IMPLANT
BNDG GAUZE STRTCH 6 (GAUZE/BANDAGES/DRESSINGS) ×1 IMPLANT
CANISTER SUCTION 2500CC (MISCELLANEOUS) ×2 IMPLANT
CHLORAPREP W/TINT 10.5 ML (MISCELLANEOUS) ×4 IMPLANT
CLIP TI MEDIUM 24 (CLIP) ×2 IMPLANT
CLIP TI WIDE RED SMALL 24 (CLIP) ×2 IMPLANT
CLOTH BEACON ORANGE TIMEOUT ST (SAFETY) ×2 IMPLANT
CLSR STERI-STRIP ANTIMIC 1/2X4 (GAUZE/BANDAGES/DRESSINGS) ×4 IMPLANT
CONT SPEC 4OZ CLIKSEAL STRL BL (MISCELLANEOUS) ×16 IMPLANT
COVER SURGICAL LIGHT HANDLE (MISCELLANEOUS) ×2 IMPLANT
CRADLE DONUT ADULT HEAD (MISCELLANEOUS) ×2 IMPLANT
DRAPE PED LAPAROTOMY (DRAPES) ×4 IMPLANT
DRAPE SLUSH MACHINE 52X66 (DRAPES) ×1 IMPLANT
DRAPE UTILITY 15X26 W/TAPE STR (DRAPE) ×8 IMPLANT
ELECT CAUTERY BLADE 6.4 (BLADE) ×2 IMPLANT
ELECT REM PT RETURN 9FT ADLT (ELECTROSURGICAL) ×2
ELECTRODE REM PT RTRN 9FT ADLT (ELECTROSURGICAL) ×1 IMPLANT
GAUZE SPONGE 4X4 16PLY XRAY LF (GAUZE/BANDAGES/DRESSINGS) ×2 IMPLANT
GLOVE BIO SURGEON STRL SZ7.5 (GLOVE) ×1 IMPLANT
GLOVE BIOGEL PI IND STRL 7.5 (GLOVE) IMPLANT
GLOVE BIOGEL PI INDICATOR 7.5 (GLOVE) ×2
GLOVE SURG ORTHO 8.0 STRL STRW (GLOVE) ×4 IMPLANT
GOWN PREVENTION PLUS XLARGE (GOWN DISPOSABLE) ×2 IMPLANT
GOWN STRL NON-REIN LRG LVL3 (GOWN DISPOSABLE) ×4 IMPLANT
GOWN STRL REIN XL XLG (GOWN DISPOSABLE) ×2 IMPLANT
HEMOSTAT SURGICEL 2X4 FIBR (HEMOSTASIS) ×2 IMPLANT
IV SODIUM CHL 0.9 SLUSH (IV SOLUTION) ×1 IMPLANT
KIT BASIN OR (CUSTOM PROCEDURE TRAY) ×2 IMPLANT
KIT ROOM TURNOVER OR (KITS) ×2 IMPLANT
NS IRRIG 1000ML POUR BTL (IV SOLUTION) ×6 IMPLANT
PACK SURGICAL SETUP 50X90 (CUSTOM PROCEDURE TRAY) ×2 IMPLANT
PAD ARMBOARD 7.5X6 YLW CONV (MISCELLANEOUS) ×2 IMPLANT
PENCIL BUTTON HOLSTER BLD 10FT (ELECTRODE) ×2 IMPLANT
SPECIMEN JAR SMALL (MISCELLANEOUS) ×4 IMPLANT
SPONGE GAUZE 4X4 12PLY (GAUZE/BANDAGES/DRESSINGS) ×1 IMPLANT
SPONGE INTESTINAL PEANUT (DISPOSABLE) ×2 IMPLANT
STRIP CLOSURE SKIN 1/2X4 (GAUZE/BANDAGES/DRESSINGS) ×4 IMPLANT
SUT MNCRL AB 4-0 PS2 18 (SUTURE) ×4 IMPLANT
SUT PROLENE 4 0 RB 1 (SUTURE) ×2
SUT PROLENE 4-0 RB1 .5 CRCL 36 (SUTURE) ×1 IMPLANT
SUT SILK 2 0 (SUTURE) ×2
SUT SILK 2-0 18XBRD TIE 12 (SUTURE) ×1 IMPLANT
SUT VIC AB 3-0 SH 18 (SUTURE) ×4 IMPLANT
SYR BULB 3OZ (MISCELLANEOUS) ×2 IMPLANT
TAPE CLOTH SURG 6X10 WHT LF (GAUZE/BANDAGES/DRESSINGS) ×1 IMPLANT
TOWEL OR 17X24 6PK STRL BLUE (TOWEL DISPOSABLE) ×2 IMPLANT
TOWEL OR 17X26 10 PK STRL BLUE (TOWEL DISPOSABLE) ×2 IMPLANT
TUBE CONNECTING 12X1/4 (SUCTIONS) ×2 IMPLANT
UNDERPAD 30X30 INCONTINENT (UNDERPADS AND DIAPERS) ×2 IMPLANT
WATER STERILE IRR 1000ML POUR (IV SOLUTION) IMPLANT

## 2012-10-21 NOTE — Interval H&P Note (Signed)
History and Physical Interval Note:  10/21/2012 11:46 AM  James Brown  has presented today for surgery, with the diagnosis of secondary hyperparathyroidism, ESRD.  The various methods of treatment have been discussed with the patient and family. After consideration of risks, benefits and other options for treatment, the patient has consented to    Procedure(s): TOTAL PARATHYROIDECTOMY WITH AUTOTRANSPLANT TO THE LEFT FOREARM (Left) as a surgical intervention .    The patient's history has been reviewed, patient examined, no change in status, stable for surgery.  I have reviewed the patient's chart and labs.  Questions were answered to the patient's satisfaction.    Earnstine Regal, MD, [facility] Office: [place]

## 2012-10-21 NOTE — Progress Notes (Signed)
[facility] Renal Consultation Note    Indication for Consultation:  Management of ESRD/hemodialysis; anemia, hypertension/volume and secondary hyperparathyroidism  HPI: James Brown is a 47 y.o. male with ESRD on MWF dialysis at [facility] since April 2008  admitted following PTHectomy today for secondary hyperparathyroidism unresponsive to management.  He has had difficulty tolerating sensipar due to GI side effects. Outpatient PTH levels have been highly variable. Presentingly he is in the PACU complaining of pain and thirst. No SOB, N, and vomiting.  iPTH    210  04/09/12  iPTH   2023   07/16/12 iPTH     809    08/13/12 iPTH   2197    09/03/12 iPTH      471   10/01/12  Past Medical History  Diagnosis Date  . Hypertension   . Hyperlipidemia   . Dermatitis   . Tinea versicolor   . AAA (abdominal aortic aneurysm)   . ESRD (end stage renal disease) on dialysis     started 09/2006 MWF [facility]  . Diabetes    Past Surgical History  Procedure Laterality Date  . Av fistula placement  06/2005    Right Arm   History reviewed. No pertinent family history. Social History:  reports that he has never smoked. He does not have any smokeless tobacco history on file. He reports that he does not drink alcohol or use illicit drugs. No Known Allergies Prior to Admission medications   Medication Sig Start Date End Date Taking? Authorizing Provider  Calcium Carbonate Antacid (TUMS ULTRA PO) Take by mouth 3 (three) times daily.     Yes Historical Provider, MD  cinacalcet (SENSIPAR) 90 MG tablet Take 90 mg by mouth daily.     Yes Historical Provider, MD  doxycycline (DORYX) 100 MG DR capsule Take 100 mg by mouth daily.     Yes Historical Provider, MD  fenofibrate 160 MG tablet Take 160 mg by mouth daily.     Yes Historical Provider, MD  Lanthanum Carbonate (FOSRENOL PO) Take 1 tablet by mouth 3 (three) times daily before meals.    Yes Historical Provider, MD  simvastatin (ZOCOR) 80 MG tablet  Take 80 mg by mouth at bedtime.     Yes Historical Provider, MD   Current Facility-Administered Medications  Medication Dose Route Frequency Provider Last Rate Last Dose  . 0.9 %  sodium chloride infusion   Intravenous Continuous Warrick Parisian, MD 35 mL/hr at 10/21/12 1151 35 mL/hr at 10/21/12 1151  . HYDROmorphone (DILAUDID) 1 MG/ML injection           . HYDROmorphone (DILAUDID) injection 0.25-0.5 mg  0.25-0.5 mg Intravenous Q5 min PRN Warrick Parisian, MD   0.25 mg at 10/21/12 1526  . mupirocin ointment (BACTROBAN) 2 %   Nasal BID Earnstine Regal, MD   1 application at [id number]  . mupirocin ointment (BACTROBAN) 2 %           . ondansetron (ZOFRAN) injection 4 mg  4 mg Intravenous Once PRN Warrick Parisian, MD       Labs: Basic Metabolic Panel:  Recent Labs Lab 10/21/12 1052  NA 136  K 3.3*  CL 92*  CO2 25  GLUCOSE 188*  BUN 62*  CREATININE 9.64*  CALCIUM 10.8*  CBC:  Recent Labs Lab 10/21/12 1052  WBC 9.9  HGB 11.9*  HCT 35.0*  MCV 77.6*  PLT 294  CBG:  Recent Labs Lab 10/21/12 1112 10/21/12 1457  GLUCAP 178*  135*  Studies/Results: Dg Chest 2 View  10/21/2012  *RADIOLOGY REPORT*  Clinical Data: Preop.  AAA.  History of hypertension and end-stage renal disease.  CHEST - 2 VIEW  Comparison:  08/02/2011  Findings: Mild cardiomegaly.  Lungs are clear.  No effusions or edema.  No acute bony abnormality.  Degenerative changes in the thoracic spine.  IMPRESSION: Mild cardiomegaly.  No acute findings.   Original Report Authenticated By: Rolm Baptise, M.D.    ROS: Negative except as per HPI.  Physical Exam: Filed Vitals:   10/21/12 1500 10/21/12 1509 10/21/12 1515 10/21/12 1524  BP:  151/97  163/95  Pulse: 88 89 88 92  Temp:      TempSrc:      Resp: 9 8 11 24   SpO2: 98% 98% 98% 99%     General: Well developed, well nourished, uncomfortable. Head: Normocephalic, atraumatic, sclera non-icteric, mouth dry Neck: Supple dressing in tact Lungs: Clear  bilaterally to auscultation anteriorly wheezes, rales, or rhonchi. Breathing is unlabored. Heart: RRR with S1 S2. . Abdomen: Soft, non-tender, non-distended with normoactive bowel sounds.  M-S:  Strength and tone appear normal for age. Lower extremities: without edema or ischemic changes, no open wounds +_ SCDS Neuro: Alert and oriented X 3. Moves all extremities spontaneously. Psych:  Responds to questions appropriately with a normal affect. Dialysis Access: right upper AVF +bruit  button hole tortuous.  Dialysis Orders: Center: [facility] MWF Optiflux 200 500/800 EDW 103 2K 2.25 profil2 2 right AVF buttonhole epo 1000 hectorol just increased to 8 from 6  Recent labs:  Hgb 10.9 4/28 ferritin 2393 4/09 49% sat iPTh 471 4/09  Assessment/Plan: 1. s/p parathyroidectomy/metabolic bone disease - pre op Ca high; check post op and serially q 12 hours. D/c sensipar and fosrenol; will likely need increased CaCO3 dose; start with CaC)3 suspension ac and HS, hectorol 6 and added Ca bath- agree completely 2. ESRD -  MWF via AVF - HD in am; K low pre op, often increases during parathyroidectomy - check post op K; Needs added Ca bath post operatively. If K is not high enough, will need to give KCL supplements; hold vitamin to limit pill burden post operatively 3. Hypertension/volume  - volume control. Not on any antihypertensives. 4. Anemia  - Hgb 11.9 - no ESA for now; follow 5. Nutrition - post op liquid diet; advance as tolerated. 6. Diabetes - hold glipizide and cover with SSI 7. Hyperlipidemia - on fenofibrate and simvastatin- hold to limit pill burden for now  Myriam Jacobson, Hershal Coria [place] [phone] 10/21/2012, 3:29 PM   Patient seen and examined, agree with above note with above modifications. 47 year old fairly good dialysis pt.  He now presents for parathyroidectomy since could not tolerate sensipar.  He did not have any issues during surgery- will follow post op serial calcium  levels.  Plan for HD tomorrow (regular day) Corliss Parish, MD 10/21/2012   ]

## 2012-10-21 NOTE — Preoperative (Signed)
Beta Blockers   Reason not to administer Beta Blockers:Not Applicable 

## 2012-10-21 NOTE — Anesthesia Postprocedure Evaluation (Signed)
  Anesthesia Post-op Note  Patient: James Brown  Procedure(s) Performed: Procedure(s): TOTAL PARATHYROIDECTOMY WITH AUTOTRANSPLANT TO THE LEFT FOREARM (Left)  Patient Location: PACU  Anesthesia Type:General  Level of Consciousness: awake, alert , oriented and patient cooperative  Airway and Oxygen Therapy: Patient Spontanous Breathing  Post-op Pain: mild  Post-op Assessment: Post-op Vital signs reviewed, Patient's Cardiovascular Status Stable, Respiratory Function Stable, Patent Airway, No signs of Nausea or vomiting and Pain level controlled  Post-op Vital Signs: stable  Complications: No apparent anesthesia complications

## 2012-10-21 NOTE — H&P (View-Only) (Signed)
General Surgery [facility]  Chief Complaint  Patient presents with  . New Evaluation    secondary hyperparathyroidism, ESRD - referral from Dr. Jeneen Rinks Deterding    HISTORY: Patient is a 47 year old male referred by his nephrologist for consideration of total parathyroidectomy for management of secondary hyperparathyroidism. Patient has end-stage renal disease and is on hemodialysis. He dialyzes at the [facility] street facility on Mondays Wednesdays and Fridays. He currently has a fistula in the right upper extremity.  Patient has intact PTH levels of nearly 2200. Calcium is in the upper range of normal at 10.0. Phosphorus is elevated at 8.5. Patient attempted to take Sensipar but had gastrointestinal side effects. He has not been compliant with his phosphorus lining agents.  Patient has had no prior head or neck surgery. He does note muscle weakness and fatigue.  Past Medical History  Diagnosis Date  . Hypertension   . Hyperlipidemia   . Dermatitis   . Tinea versicolor   . AAA (abdominal aortic aneurysm)   . Chronic kidney disease     M, W, F [facility]      Current Outpatient Prescriptions  Medication Sig Dispense Refill  . Calcium Carbonate Antacid (TUMS ULTRA PO) Take by mouth 3 (three) times daily.        . cinacalcet (SENSIPAR) 90 MG tablet Take 90 mg by mouth daily.        Marland Kitchen doxycycline (DORYX) 100 MG DR capsule Take 100 mg by mouth daily.        . fenofibrate 160 MG tablet Take 160 mg by mouth daily.        . GuaiFENesin (MUCINEX PO) Take by mouth as needed.        . Lanthanum Carbonate (FOSRENOL PO) Take by mouth 3 (three) times daily before meals.        Marland Kitchen loratadine (CLARITIN) 10 MG tablet Take 10 mg by mouth daily.        . multivitamin (RENA-VIT) TABS tablet Take 1 tablet by mouth daily.        Marland Kitchen oxyCODONE (ROXICODONE) 5 MG immediate release tablet 1-2 tabs every 4 hours as needed for pain  30 tablet  0  . simvastatin (ZOCOR) 80 MG  tablet Take 80 mg by mouth at bedtime.        . vardenafil (LEVITRA) 20 MG tablet Take 20 mg by mouth daily as needed.         No current facility-administered medications for this visit.     No Known Allergies   Family History  Problem Relation Age of Onset  . Family history unknown: Yes     History   Social History  . Marital Status: Married    Spouse Name: N/A    Number of Children: N/A  . Years of Education: N/A   Social History Main Topics  . Smoking status: Never Smoker   . Smokeless tobacco: None  . Alcohol Use: No  . Drug Use: No  . Sexually Active: None   Other Topics Concern  . None   Social History Narrative  . None     REVIEW OF SYSTEMS - PERTINENT POSITIVES ONLY: Muscle weakness. Bone and joint discomfort. Chronic fatigue.  EXAM: Filed Vitals:   10/09/12 1136  BP: 130/84  Pulse: 98  Temp: 98 F (36.7 C)  Resp: 16    HEENT: normocephalic; pupils equal and reactive; sclerae clear; dentition good; mucous membranes moist NECK:  No palpable masses in the  thyroid bed; symmetric on extension; no palpable anterior or posterior cervical lymphadenopathy; no supraclavicular masses; no tenderness CHEST: clear to auscultation bilaterally without rales, rhonchi, or wheezes CARDIAC: regular rate and rhythm without significant murmur; peripheral pulses are full EXT:  non-tender without edema; no deformity; fistula in right upper arm, patent NEURO: no gross focal deficits; no sign of tremor   LABORATORY RESULTS: See [facility] (CHL-Epic) for most recent results   RADIOLOGY RESULTS: See [facility] (CHL-Epic) for most recent results   IMPRESSION: #1 secondary hyperparathyroidism #2 end-stage renal disease secondary to chronic nephritis  PLAN: I had a lengthy discussion with the patient regarding secondary hyperparathyroidism and its management with total parathyroidectomy. We discussed the surgical procedure of total parathyroidectomy with  autotransplantation to the left forearm. We discussed risk and benefits of the procedure including injury to recurrent laryngeal nerves and the possibility of recurrent disease regarding further surgical procedures. We discussed his hospitalization and his postoperative recovery. He understands and wishes to proceed. We will make arrangements in conjunction with the nephrology service for surgery in the near future.  The risks and benefits of the procedure have been discussed at length with the patient.  The patient understands the proposed procedure, potential alternative treatments, and the course of recovery to be expected.  All of the patient's questions have been answered at this time.  The patient wishes to proceed with surgery.  Earnstine Regal, MD, [facility]   Visit Diagnoses: 1. Hyperparathyroidism, secondary   2. ESRD     Primary Care Physician: Placido Sou, MD

## 2012-10-21 NOTE — Anesthesia Preprocedure Evaluation (Addendum)
Anesthesia Evaluation  Patient identified by MRN, date of birth, ID band Patient awake    Reviewed: Allergy & Precautions, H&P , Patient's Chart, lab work & pertinent test results, reviewed documented beta blocker date and time   Airway Mallampati: I TM Distance: >3 FB Neck ROM: full    Dental  (+) Teeth Intact and Dental Advisory Given   Pulmonary sleep apnea ,          Cardiovascular hypertension, Pt. on medications + Peripheral Vascular Disease Rhythm:regular Rate:Normal     Neuro/Psych    GI/Hepatic   Endo/Other    Renal/GU ESRF and DialysisRenal diseaseM-W-Fr.     Musculoskeletal   Abdominal   Peds  Hematology   Anesthesia Other Findings   Reproductive/Obstetrics                         Anesthesia Physical Anesthesia Plan  ASA: III  Anesthesia Plan: General   Post-op Pain Management:    Induction: Intravenous  Airway Management Planned: Oral ETT  Additional Equipment:   Intra-op Plan:   Post-operative Plan: Extubation in OR  Informed Consent: I have reviewed the patients History and Physical, chart, labs and discussed the procedure including the risks, benefits and alternatives for the proposed anesthesia with the patient or authorized representative who has indicated his/her understanding and acceptance.     Plan Discussed with: CRNA, Anesthesiologist and Surgeon  Anesthesia Plan Comments:         Anesthesia Quick Evaluation

## 2012-10-21 NOTE — Anesthesia Procedure Notes (Signed)
Procedure Name: Intubation Date/Time: 10/21/2012 12:11 PM Performed by: Williemae Area B Pre-anesthesia Checklist: Patient identified, Emergency Drugs available, Suction available and Patient being monitored Patient Re-evaluated:Patient Re-evaluated prior to inductionOxygen Delivery Method: Circle system utilized Preoxygenation: Pre-oxygenation with 100% oxygen Intubation Type: IV induction Ventilation: Mask ventilation without difficulty Laryngoscope Size: Mac and 4 Grade View: Grade I Tube type: Oral Tube size: 8.0 mm Number of attempts: 1 Airway Equipment and Method: Stylet Placement Confirmation: ETT inserted through vocal cords under direct vision,  breath sounds checked- equal and bilateral and positive ETCO2 Secured at: 23 (cm at teeth) cm Tube secured with: Tape Dental Injury: Teeth and Oropharynx as per pre-operative assessment

## 2012-10-21 NOTE — Transfer of Care (Signed)
Immediate Anesthesia Transfer of Care Note  Patient: James Brown  Procedure(s) Performed: Procedure(s): TOTAL PARATHYROIDECTOMY WITH AUTOTRANSPLANT TO THE LEFT FOREARM (Left)  Patient Location: PACU  Anesthesia Type:General  Level of Consciousness: awake, alert , oriented and patient cooperative  Airway & Oxygen Therapy: Patient Spontanous Breathing and Patient connected to face mask oxygen  Post-op Assessment: Report given to PACU RN, Post -op Vital signs reviewed and stable and Patient moving all extremities  Post vital signs: Reviewed and stable  Complications: No apparent anesthesia complications

## 2012-10-21 NOTE — Brief Op Note (Signed)
10/21/2012  2:55 PM  PATIENT:  James Brown  47 y.o. male  PRE-OPERATIVE DIAGNOSIS:  Secondary Hyperparathyroidism, ESRD  POST-OPERATIVE DIAGNOSIS:  Secondary Hyperparathyroidism, ESRD  PROCEDURE:  Procedure(s): TOTAL PARATHYROIDECTOMY WITH AUTOTRANSPLANT TO THE LEFT FOREARM (Left)  SURGEON:  Surgeon(s) and Role:    * Earnstine Regal, MD - Primary    * Ralene Ok, MD - Assisting  ANESTHESIA:   general  EBL:  Total I/O In: 1050 [I.V.:1050] Out: -   BLOOD ADMINISTERED:none  DRAINS: none   LOCAL MEDICATIONS USED:  NONE  SPECIMEN:  Excision  DISPOSITION OF SPECIMEN:  PATHOLOGY  COUNTS:  YES  TOURNIQUET:  * No tourniquets in log *  DICTATION: .Other Dictation: Dictation Number [id number]  PLAN OF CARE: Admit to inpatient   PATIENT DISPOSITION:  PACU - hemodynamically stable.   Delay start of Pharmacological VTE agent (>24hrs) due to surgical blood loss or risk of bleeding: yes  Earnstine Regal, MD, [facility] Office: [phone]

## 2012-10-22 ENCOUNTER — Encounter (HOSPITAL_COMMUNITY): Payer: Self-pay | Admitting: Surgery

## 2012-10-22 LAB — RENAL FUNCTION PANEL
Albumin: 3.1 g/dL — ABNORMAL LOW (ref 3.5–5.2)
Albumin: 3.5 g/dL (ref 3.5–5.2)
BUN: 75 mg/dL — ABNORMAL HIGH (ref 6–23)
BUN: 29 mg/dL — ABNORMAL HIGH (ref 6–23)
CO2: 24 mEq/L (ref 19–32)
CO2: 30 mEq/L (ref 19–32)
Calcium: 7.6 mg/dL — ABNORMAL LOW (ref 8.4–10.5)
Calcium: 10.5 mg/dL (ref 8.4–10.5)
Chloride: 92 mEq/L — ABNORMAL LOW (ref 96–112)
Chloride: 94 mEq/L — ABNORMAL LOW (ref 96–112)
Creatinine, Ser: 11.68 mg/dL — ABNORMAL HIGH (ref 0.50–1.35)
Creatinine, Ser: 6.38 mg/dL — ABNORMAL HIGH (ref 0.50–1.35)
GFR calc Af Amer: 5 mL/min — ABNORMAL LOW (ref >90)
GFR calc Af Amer: 11 mL/min — ABNORMAL LOW (ref >90)
GFR calc non Af Amer: 5 mL/min — ABNORMAL LOW (ref >90)
GFR calc non Af Amer: 9 mL/min — ABNORMAL LOW (ref >90)
Glucose, Bld: 165 mg/dL — ABNORMAL HIGH (ref 70–99)
Glucose, Bld: 177 mg/dL — ABNORMAL HIGH (ref 70–99)
Phosphorus: 8 mg/dL — ABNORMAL HIGH (ref 2.3–4.6)
Phosphorus: 4.1 mg/dL (ref 2.3–4.6)
Potassium: 3.8 mEq/L (ref 3.5–5.1)
Potassium: 3.9 mEq/L (ref 3.5–5.1)
Sodium: 135 mEq/L (ref 135–145)
Sodium: 136 mEq/L (ref 135–145)

## 2012-10-22 LAB — CBC
HCT: 31 % — ABNORMAL LOW (ref 39.0–52.0)
Hemoglobin: 10.3 g/dL — ABNORMAL LOW (ref 13.0–17.0)
MCH: 25.7 pg — ABNORMAL LOW (ref 26.0–34.0)
MCHC: 33.2 g/dL (ref 30.0–36.0)
MCV: 77.3 fL — ABNORMAL LOW (ref 78.0–100.0)
Platelets: 239 10*3/uL (ref 150–400)
RBC: 4.01 MIL/uL — ABNORMAL LOW (ref 4.22–5.81)
RDW: 13.2 % (ref 11.5–15.5)
WBC: 14.3 10*3/uL — ABNORMAL HIGH (ref 4.0–10.5)

## 2012-10-22 MED ORDER — SODIUM CHLORIDE 0.9 % IV SOLN: 100.0000 mL | INTRAVENOUS | Status: DC | PRN

## 2012-10-22 MED ORDER — HEPARIN SODIUM (PORCINE) 1000 UNIT/ML DIALYSIS: 1000.0000 [IU] | INTRAMUSCULAR | Status: DC | PRN

## 2012-10-22 MED ORDER — LIDOCAINE-PRILOCAINE 2.5-2.5 % EX CREA: 1.0000 "application " | TOPICAL_CREAM | CUTANEOUS | Status: DC | PRN

## 2012-10-22 MED ORDER — POTASSIUM CHLORIDE 20 MEQ/15ML (10%) PO LIQD
20.0000 meq | Freq: Every day | ORAL | Status: AC
Start: 2012-10-22 — End: 2012-10-22
  Administered 2012-10-22: 20 meq via ORAL
  Filled 2012-10-22: qty 15

## 2012-10-22 MED ORDER — ALTEPLASE 2 MG IJ SOLR: 2.0000 mg | Freq: Once | INTRAMUSCULAR | Status: DC | PRN

## 2012-10-22 MED ORDER — NEPRO/CARBSTEADY PO LIQD: 237.0000 mL | ORAL | Status: DC | PRN

## 2012-10-22 MED ORDER — DARBEPOETIN ALFA-POLYSORBATE 25 MCG/0.42ML IJ SOLN
6.2500 ug | INTRAMUSCULAR | Status: DC
  Administered 2012-10-22: 6.5476 ug via INTRAVENOUS

## 2012-10-22 MED ORDER — DOXERCALCIFEROL 4 MCG/2ML IV SOLN
INTRAVENOUS | Status: AC
  Filled 2012-10-22: qty 4

## 2012-10-22 MED ORDER — DARBEPOETIN ALFA-POLYSORBATE 25 MCG/0.42ML IJ SOLN
INTRAMUSCULAR | Status: AC
  Filled 2012-10-22: qty 0.42

## 2012-10-22 MED ORDER — INSULIN ASPART 100 UNIT/ML ~~LOC~~ SOLN
0.0000 [IU] | Freq: Three times a day (TID) | SUBCUTANEOUS | Status: DC
  Administered 2012-10-22: 1 [IU] via SUBCUTANEOUS
  Administered 2012-10-22: 2 [IU] via SUBCUTANEOUS
  Administered 2012-10-23 – 2012-10-24 (×4): 1 [IU] via SUBCUTANEOUS

## 2012-10-22 MED ORDER — INSULIN ASPART 100 UNIT/ML ~~LOC~~ SOLN
3.0000 [IU] | Freq: Three times a day (TID) | SUBCUTANEOUS | Status: DC
  Administered 2012-10-22 – 2012-10-24 (×6): 3 [IU] via SUBCUTANEOUS

## 2012-10-22 MED ORDER — PENTAFLUOROPROP-TETRAFLUOROETH EX AERO: 1.0000 "application " | INHALATION_SPRAY | CUTANEOUS | Status: DC | PRN

## 2012-10-22 MED ORDER — LIDOCAINE HCL (PF) 1 % IJ SOLN: 5.0000 mL | INTRAMUSCULAR | Status: DC | PRN

## 2012-10-22 NOTE — Op Note (Signed)
NAMEJURGEN, James Brown      ACCOUNT NO.:  [id number]  MEDICAL RECORD NO.:  [id number]  LOCATION:  HD05C                        FACILITY:  [facility]  PHYSICIAN:  Earnstine Regal, MD      DATE OF BIRTH:  [birth year]  DATE OF PROCEDURE:  10/21/2012 DATE OF DISCHARGE:                              OPERATIVE REPORT   PREOPERATIVE DIAGNOSES:  Secondary hyperparathyroidism, end-stage renal disease.  POSTOPERATIVE DIAGNOSES:  Secondary hyperparathyroidism, end-stage renal disease.  PROCEDURE: 1. Neck exploration. 2. Total parathyroidectomy. 3. Autotransplantation parathyroid tissue, left brachioradialis     muscle.  SURGEON:  Earnstine Regal, MD  ASSISTANT:  Ralene Ok, MD  ANESTHESIA:  General per Dr. Kate Sable.  ESTIMATED BLOOD LOSS:  Minimal.  PREPARATION:  ChloraPrep.  COMPLICATIONS:  None.  INDICATIONS:  The patient is a 47 year old male referred by his nephrologist for secondary hyperparathyroidism.  The patient has end- stage renal disease and is on hemodialysis Mondays, Wednesdays, and Fridays at the [facility].  He currently has a fistula in the right upper extremity.  Preoperative intact PTH level exceeded 2200. The patient now comes to Surgery for total parathyroidectomy with autotransplantation.  BODY OF REPORT:  Procedures done in OR #1 at the [facility].  The patient was brought to the operating room, placed in supine position on the operating room table.  Following administration of general anesthesia, the patient was positioned and then prepped and draped in the usual aseptic fashion.  After ascertaining that an adequate level of anesthesia had been achieved, a Kocher incision was made with a #15 blade.  Dissection was carried through subcutaneous tissues and platysma.  Hemostasis was obtained with electrocautery. Skin flaps were developed cephalad and caudad from the thyroid notch to the sternal notch.  A Mahorner  self-retaining retractor was placed for exposure.  Strap muscles were incised in the midline and dissection was begun on the left side.  Left thyroid lobe was mobilized.  Middle thyroid vein was divided between Ligaclips.  Gland was rolled anteriorly.  Exploration revealed an enlarged parathyroid gland in the left thyrothymic tract.  This was gently dissected out.  Vascular pedicle was divided between small Ligaclips and the gland was excised. Biopsy of the gland was submitted to Pathology and hypercellular parathyroid tissue was confirmed by Dr. Lyndon Code in Pathology.  Remainder of the gland was placed on iced saline on the back table.  Further dissection reveals a slightly enlarged parathyroid gland just above the level of the inferior thyroid artery.  It was gently dissected out.  Vascular pedicle was divided between small Ligaclips and the gland was excised.  A fragment of the gland was submitted for frozen section analysis.  This shows hypercellular parathyroid tissue.  The remainder of the gland was placed in iced saline on the back table.  Dry pack was placed in the left neck.  Strap muscles on the right were then reflected laterally exposing the right thyroid lobe.  Right lobe was gently mobilized.  It was rolled anteriorly.  Middle thyroid vein was divided between Ligaclips. Exploration in the tracheoesophageal groove reveals a markedly enlarged parathyroid gland just above the level of the inferior thyroid artery. It was gently dissected out taking care  to preserve the recurrent laryngeal nerve.  Artery was maintained.  Vascular tributaries to the parathyroid gland were divided between small Ligaclips and the entire gland was removed.  It measured approximately 2.5 cm in greatest dimension.  A biopsy was taken and submitted to Pathology, where again parathyroid tissue was confirmed by the pathologist.  Remainder of the gland was placed in iced saline on the back  table.  Additional dissection on the right side at the inferior pole of the thyroid reveals an enlarged parathyroid gland extending into the tracheoesophageal groove.  It was gently mobilized.  Vascular pedicle was divided between Ligaclips and the gland was excised.  The gland measured approximately 2 cm in greatest dimension.  Biopsy was taken which confirmed parathyroid tissue.  Remainder of the gland was placed in iced saline on the back table.  The neck was irrigated with warm saline and good hemostasis was achieved throughout.  Fibrillar was placed throughout the operative field.  Strap muscles were reapproximated in the midline with interrupted 3-0 Vicryl sutures.  Platysma was closed with interrupted 3-0 Vicryl sutures.  Skin was closed with a running 4-0 Monocryl subcuticular suture.  Wound was washed and dried and benzoin and Steri-Strips were applied.  Sterile dressings were applied.  Next, the left forearm was placed on an arm board and properly positioned.  It was prepped and draped in the usual aseptic fashion. After ascertaining that an adequate level of anesthesia had been maintained, a 4-cm incision was made over the brachioradialis muscle. Dissection was carried through subcutaneous tissues.  Skin flaps were developed circumferentially and Weitlaner retractors were placed for exposure.  The left superior parathyroid gland was selected.  It was sectioned into 1 mm fragments on the back table.  Eight fragments were prepared for transplantation.  Transplantation was completed by incising the brachioradialis muscle fascia, creating a small submuscular pocket, inserting a fragment of parathyroid tissue measuring approximately 1 mm in diameter, and closing the overlying muscle fascia with interrupted 4- 0 Prolene simple sutures.  This exercise was repeated 8 times.  Good hemostasis was noted.  Subcutaneous tissues were reapproximated with interrupted 3-0 Vicryl sutures.   Skin was closed with a running 4-0 Monocryl subcuticular suture.  Wound was washed and dried and benzoin and Steri-Strips were applied.  Sterile dressings were applied.  The patient was awakened from anesthesia and brought to the recovery room. The patient tolerated the procedure well.     Earnstine Regal, MD     TMG/MEDQ  D:  10/21/2012  T:  10/22/2012  Job:  [id number]  cc:   Jeneen Rinks L. Deterding, M.D.

## 2012-10-22 NOTE — Progress Notes (Signed)
[facility] Progress Note  Subjective:   Coughing some. Swallowing ok.  Seen on HD- calcium dropping appropriately , now 7.6  Objective Filed Vitals:   10/22/12 0700 10/22/12 0705 10/22/12 0730 10/22/12 0800  BP: 155/85 149/84 151/80 163/90  Pulse: 91 89 82 86  Temp: 98.5 F (36.9 C)     TempSrc: Oral     Resp: 20     Height:      Weight: 106.1 kg (233 lb 14.5 oz)     SpO2: 100%      Physical Exam General: NAD onHD Heart: RRR Lungs: grossly clear anterioraly without rales Abdomen: obese soft Extremities: no significant edema; scd's in place Dialysis Access: right upper AVF Qb   Dialysis Orders: Center: [facility] MWF Optiflux 200 500/800 EDW 103 2K 2.25 profil2 2 right AVF buttonhole epo 1000 hectorol just increased to 8 from 6  Recent labs: Hgb 10.9 4/28 ferritin 2393 4/09 49% sat iPTh 471 4/09  Assessment/Plan:  1. 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 D/c sensipar and fosrenol permanently; Ca dropping 10.8>9.5>7.6 Keep on added Ca bath today and dosing hectorol 6. May need po calcitriol. On CACO3 suspension 1000 mg ac and HS. Diet advanced  - recheck labs this pm. 2. ESRD - MWF 3.8. Using 2 K bath due to need for added Ca bath; nursing ok to give OF or banana and will give 20 KCl po x 1 and recheck this afternoon;  3. Hypertension/volume - volume control. Not on any antihypertensives- bed weight pre HD today 4. Anemia - Hgb 11.9>10.3 - will give 6.25 Aranesp (1000Epo as outpt) 5. Nutrition - renal diet with modification as above 6. Diabetes - hold glipizide and cover with SSI 7. Hyperlipidemia - on fenofibrate and simvastatin- hold to limit pill burden for now 8.   Disp - he will need hospitalization until Ca levels stabilize   Myriam Jacobson, PA-C [facility] [phone] 10/22/2012,8:06 AM  LOS: 1 day   Patient seen and examined, agree with above note with  above modifications. Doing well POD 1 parathyroidectomy with autotransplantation- calcium dropping, have taken provisions.  HD today and continue to watch calcium levels Corliss Parish, MD 10/22/2012      Additional Objective Labs: Basic Metabolic Panel:  Recent Labs Lab 10/21/12 1052 10/21/12 1703 10/22/12 0657  NA 136 137 135  K 3.3* 3.9 3.8  CL 92* 94* 92*  CO2 25 24 24   GLUCOSE 188* 167* 165*  BUN 62* 66* 75*  CREATININE 9.64* 10.22* 11.68*  CALCIUM 10.8* 9.5 7.6*  PHOS  --  8.6* 8.0*   Liver Function Tests:  Recent Labs Lab 10/21/12 1703 10/22/12 0657  ALBUMIN 3.3* 3.1*   CBC:  Recent Labs Lab 10/21/12 1052 10/22/12 0657  WBC 9.9 14.3*  HGB 11.9* 10.3*  HCT 35.0* 31.0*  MCV 77.6* 77.3*  PLT 294 239  CBG:  Recent Labs Lab 10/21/12 1112 10/21/12 1457  GLUCAP 178* 135*  Studies/Results: Dg Chest 2 View  10/21/2012  *RADIOLOGY REPORT*  Clinical Data: Preop.  AAA.  History of hypertension and end-stage renal disease.  CHEST - 2 VIEW  Comparison:  08/02/2011  Findings: Mild cardiomegaly.  Lungs are clear.  No effusions or edema.  No acute bony abnormality.  Degenerative changes in the thoracic spine.  IMPRESSION: Mild cardiomegaly.  No acute findings.   Original Report Authenticated By: Rolm Baptise, M.D.    Medications: . sodium chloride  35 mL/hr (10/21/12 1151)  . sodium chloride 20 mL/hr (10/21/12 1730)   . calcium carbonate (dosed in mg elemental calcium)  1,000 mg of elemental calcium Oral TID AC & HS  . doxercalciferol  6 mcg Intravenous Q M,W,F-HD  . mupirocin ointment   Nasal BID

## 2012-10-22 NOTE — Progress Notes (Signed)
Pt stated that his throat hurts everytime he swallows, but he does not want to take any pain medicine. Offered to switch pt to a pureed diet, as this consistency is easier for him to swallow. Pt agreed. Diet changed.

## 2012-10-22 NOTE — Progress Notes (Signed)
Utilization review completed.  

## 2012-10-22 NOTE — Progress Notes (Signed)
Patient ID: James Brown, male   DOB: 25-Dec-1965, 47 y.o.   MRN: [id number]  General Surgery - [facility] - Progress Note  POD# 1  Subjective: Patient stable after total parathyroidectomy.  HD today.  Calcium fell to 7.6.  Tolerating diet.  Objective: Vital signs in last 24 hours: Temp:  [97.1 F (36.2 C)-98.8 F (37.1 C)] 98.4 F (36.9 C) (04/30 1125) Pulse Rate:  [80-104] 102 (04/30 1125) Resp:  [7-24] 18 (04/30 1125) BP: (138-175)/(67-98) 138/71 mmHg (04/30 1125) SpO2:  [95 %-100 %] 98 % (04/30 1125) Weight:  [228 lb 2.8 oz (103.5 kg)-236 lb (107.049 kg)] 228 lb 2.8 oz (103.5 kg) (04/30 1119) Last BM Date: 10/20/12  Intake/Output from previous day: 04/29 0701 - 04/30 0700 In: 1540 [P.O.:180; I.V.:1360] Out: 25 [Blood:25]  Exam: HEENT - clear, not icteric Neck - soft, mild STS Chest - clear bilaterally Cor - RRR, no murmur Ext - no significant edema; incision clear and dry Neuro - grossly intact, no focal deficits  Lab Results:   Recent Labs  10/21/12 1052 10/22/12 0657  WBC 9.9 14.3*  HGB 11.9* 10.3*  HCT 35.0* 31.0*  PLT 294 239     Recent Labs  10/21/12 1703 10/22/12 0657  NA 137 135  K 3.9 3.8  CL 94* 92*  CO2 24 24  GLUCOSE 167* 165*  BUN 66* 75*  CREATININE 10.22* 11.68*  CALCIUM 9.5 7.6*    Studies/Results: Dg Chest 2 View  10/21/2012  *RADIOLOGY REPORT*  Clinical Data: Preop.  AAA.  History of hypertension and end-stage renal disease.  CHEST - 2 VIEW  Comparison:  08/02/2011  Findings: Mild cardiomegaly.  Lungs are clear.  No effusions or edema.  No acute bony abnormality.  Degenerative changes in the thoracic spine.  IMPRESSION: Mild cardiomegaly.  No acute findings.   Original Report Authenticated By: Rolm Baptise, M.D.     Assessment / Plan: 1.  Status post total parathyroidectomy  Pain control  Encourage po intake  HD completed today  Per renal service  Home when stable from renal standpoint  Earnstine Regal, MD, [facility] Office: [phone]  10/22/2012

## 2012-10-22 NOTE — Procedures (Signed)
Patient was seen on dialysis and the procedure was supervised.  BFR 450  Via AVF BP is  174/98.   Patient appears to be tolerating treatment well  Charity Tessier A 10/22/2012

## 2012-10-23 LAB — RENAL FUNCTION PANEL
Albumin: 3.1 g/dL — ABNORMAL LOW (ref 3.5–5.2)
Albumin: 3.3 g/dL — ABNORMAL LOW (ref 3.5–5.2)
BUN: 43 mg/dL — ABNORMAL HIGH (ref 6–23)
BUN: 54 mg/dL — ABNORMAL HIGH (ref 6–23)
CO2: 26 mEq/L (ref 19–32)
CO2: 25 mEq/L (ref 19–32)
Calcium: 8.9 mg/dL (ref 8.4–10.5)
Calcium: 9.3 mg/dL (ref 8.4–10.5)
Chloride: 93 mEq/L — ABNORMAL LOW (ref 96–112)
Chloride: 93 mEq/L — ABNORMAL LOW (ref 96–112)
Creatinine, Ser: 9.28 mg/dL — ABNORMAL HIGH (ref 0.50–1.35)
Creatinine, Ser: 10.38 mg/dL — ABNORMAL HIGH (ref 0.50–1.35)
GFR calc Af Amer: 7 mL/min — ABNORMAL LOW (ref >90)
GFR calc Af Amer: 6 mL/min — ABNORMAL LOW (ref >90)
GFR calc non Af Amer: 6 mL/min — ABNORMAL LOW (ref >90)
GFR calc non Af Amer: 5 mL/min — ABNORMAL LOW (ref >90)
Glucose, Bld: 155 mg/dL — ABNORMAL HIGH (ref 70–99)
Glucose, Bld: 117 mg/dL — ABNORMAL HIGH (ref 70–99)
Phosphorus: 4.5 mg/dL (ref 2.3–4.6)
Phosphorus: 3.9 mg/dL (ref 2.3–4.6)
Potassium: 3.4 mEq/L — ABNORMAL LOW (ref 3.5–5.1)
Potassium: 3.8 mEq/L (ref 3.5–5.1)
Sodium: 134 mEq/L — ABNORMAL LOW (ref 135–145)
Sodium: 135 mEq/L (ref 135–145)

## 2012-10-23 LAB — GLUCOSE, CAPILLARY: Glucose-Capillary: 148 mg/dL — ABNORMAL HIGH (ref 70–99)

## 2012-10-23 MED ORDER — PENTAFLUOROPROP-TETRAFLUOROETH EX AERO: 1.0000 "application " | INHALATION_SPRAY | CUTANEOUS | Status: DC | PRN

## 2012-10-23 MED ORDER — LIDOCAINE HCL (PF) 1 % IJ SOLN: 5.0000 mL | INTRAMUSCULAR | Status: DC | PRN

## 2012-10-23 MED ORDER — LIDOCAINE-PRILOCAINE 2.5-2.5 % EX CREA: 1.0000 "application " | TOPICAL_CREAM | CUTANEOUS | Status: DC | PRN

## 2012-10-23 MED ORDER — NEPRO/CARBSTEADY PO LIQD: 237.0000 mL | ORAL | Status: DC | PRN

## 2012-10-23 MED ORDER — HEPARIN SODIUM (PORCINE) 1000 UNIT/ML DIALYSIS: 1000.0000 [IU] | INTRAMUSCULAR | Status: DC | PRN

## 2012-10-23 MED ORDER — ALTEPLASE 2 MG IJ SOLR: 2.0000 mg | Freq: Once | INTRAMUSCULAR | Status: AC | PRN

## 2012-10-23 MED ORDER — SODIUM CHLORIDE 0.9 % IV SOLN: 100.0000 mL | INTRAVENOUS | Status: DC | PRN

## 2012-10-23 MED ORDER — POTASSIUM CHLORIDE 20 MEQ/15ML (10%) PO LIQD
40.0000 meq | Freq: Two times a day (BID) | ORAL | Status: AC
  Administered 2012-10-23 (×2): 40 meq via ORAL
  Filled 2012-10-23 (×2): qty 30

## 2012-10-23 NOTE — Progress Notes (Signed)
Patient ID: James Brown, male   DOB: Aug 11, 1965, 47 y.o.   MRN: [id number]  [facility] - Progress Note  POD# 2  Subjective: Patient improving.  Less pain in forearm and mild soreness in neck.  Tolerating soft diet.  Objective: Vital signs in last 24 hours: Temp:  [97.7 F (36.5 C)-99.5 F (37.5 C)] 98.5 F (36.9 C) (05/01 0952) Pulse Rate:  [98-112] 99 (05/01 0952) Resp:  [18-20] 18 (05/01 0952) BP: (132-178)/(62-95) 132/66 mmHg (05/01 0952) SpO2:  [97 %-100 %] 98 % (05/01 0952) Weight:  [228 lb 2.8 oz (103.5 kg)-228 lb 9.9 oz (103.7 kg)] 228 lb 9.9 oz (103.7 kg) (04/30 2134) Last BM Date: 10/20/12  Intake/Output from previous day: 04/30 0701 - 05/01 0700 In: 240 [P.O.:240] Out: 3200   Exam: HEENT - clear, not icteric Neck - soft, mild STS Chest - clear bilaterally Cor - RRR, no murmur Ext - no significant edema; left forearm incision clear and dry Neuro - grossly intact, no focal deficits  Lab Results:   Recent Labs  10/21/12 1052 10/22/12 0657  WBC 9.9 14.3*  HGB 11.9* 10.3*  HCT 35.0* 31.0*  PLT 294 239     Recent Labs  10/22/12 1522 10/23/12 0634  NA 136 134*  K 3.9 3.4*  CL 94* 93*  CO2 30 26  GLUCOSE 177* 155*  BUN 29* 43*  CREATININE 6.38* 9.28*  CALCIUM 10.5 8.9    Studies/Results: Dg Chest 2 View  10/21/2012  *RADIOLOGY REPORT*  Clinical Data: Preop.  AAA.  History of hypertension and end-stage renal disease.  CHEST - 2 VIEW  Comparison:  08/02/2011  Findings: Mild cardiomegaly.  Lungs are clear.  No effusions or edema.  No acute bony abnormality.  Degenerative changes in the thoracic spine.  IMPRESSION: Mild cardiomegaly.  No acute findings.   Original Report Authenticated By: Rolm Baptise, M.D.     Assessment / Plan: 1.  Status post total parathyroidectomy  Calcium levels improved  Anticipate HD tomorrow  Likely home Friday after HD if OK with renal service  Earnstine Regal, MD,  [facility] Office: [phone]  10/23/2012

## 2012-10-23 NOTE — Progress Notes (Signed)
[facility] Progress Note  Subjective:   Neck sore. Arm less so.   Objective Filed Vitals:   10/22/12 1841 10/22/12 2134 10/23/12 0534 10/23/12 0952  BP: 132/62 135/64 178/95 132/66  Pulse: 112 107 98 99  Temp: 99.5 F (37.5 C) 98.5 F (36.9 C) 98.3 F (36.8 C) 98.5 F (36.9 C)  TempSrc: Oral Oral Oral Oral  Resp: 18 20 20 18   Height:      Weight:  103.7 kg (228 lb 9.9 oz)    SpO2: 97% 98% 99% 98%   Physical Exam General:NAD Heart: RRR Lungs: no rales Abdomen: obese soft Extremities: no LE edmea Dialysis Access: right upper AVF with buttonholes + bruit  Dialysis Orders: Center: [facility] MWF Optiflux 200 500/800 EDW 103 2K 2.25 profil2 2 right AVF buttonhole epo 1000 hectorol just increased to 8 from 6  Recent labs: Hgb 10.9 4/28 ferritin 2393 4/09 49% sat iPTh 471 4/09   Assessment/Plan: 1. 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 D/c sensipar and fosrenol permanently; Ca dropping 10.8>9.5>7.6>10.5 (post HD)>8.9 this am.  dosing hectorol 6. On CACO3 suspension 1000 mg ac and HS. Diet advanced - recheck labs this pm. 2. ESRD - MWF K still low; Ca spike after HD Wed due to IV hectorol; drifting back down; recheck this pm and again pre HD Friday; given supplemental K to see if we can get K high enough to be on an added Ca bath - will start on added Ca and change if K too low.  3. Hypertension/volume - volume control. Not on any antihypertensives- bed weight pre HD today - can stand on Friday UF 3.2 Wed. 4. Anemia - Hgb 11.9>10.3 - will give 6.25 Aranesp (1000Epo as outpt)- recheck Friday 5. Nutrition - pureed diet due to swallowing soreness - resume multivit at d/c 6. Diabetes - hold glipizide and cover with SSI - BS ok 7. Hyperlipidemia - on fenofibrate and simvastatin- hold to limit pill burden for now- resume at d/c       8.   Disp - he will need hospitalization until Ca levels  stabilize   Myriam Jacobson, PA-C [place] [facility] [phone] 10/23/2012,10:51 AM  LOS: 2 days   Patient seen and examined, agree with above note with above modifications. Getting better slowly- variability in calc was up after high calc bath and 6 of hectorol- back down some today, will see if can maintain the day without the supp other than caco3 susp-  HOme after HD tomorrow is possible as long as doesn't require anything supplemental today.  Will check it every treatment for 3 as OP Corliss Parish, MD 10/23/2012      Additional Objective Labs: Basic Metabolic Panel:  Recent Labs Lab 10/22/12 0657 10/22/12 1522 10/23/12 0634  NA 135 136 134*  K 3.8 3.9 3.4*  CL 92* 94* 93*  CO2 24 30 26   GLUCOSE 165* 177* 155*  BUN 75* 29* 43*  CREATININE 11.68* 6.38* 9.28*  CALCIUM 7.6* 10.5 8.9  PHOS 8.0* 4.1 4.5   Liver Function Tests:  Recent Labs Lab 10/22/12 0657 10/22/12 1522 10/23/12 0634  ALBUMIN 3.1* 3.5 3.1*  CBC:  Recent Labs Lab 10/21/12 1052 10/22/12 0657  WBC 9.9 14.3*  HGB 11.9* 10.3*  HCT 35.0* 31.0*  MCV 77.6* 77.3*  PLT 294 239  CBG:  Recent Labs Lab 10/21/12 1457 10/22/12 1133 10/22/12 1725 10/22/12 2130 10/23/12 0736  GLUCAP 135* 136* 156*  124* 147*  Medications: . sodium chloride 35 mL/hr (10/21/12 1151)  . sodium chloride 20 mL/hr (10/21/12 1730)   . calcium carbonate (dosed in mg elemental calcium)  1,000 mg of elemental calcium Oral TID AC & HS  . darbepoetin (ARANESP) injection - DIALYSIS  6.5476 mcg Intravenous Q Wed-HD  . doxercalciferol  6 mcg Intravenous Q M,W,F-HD  . insulin aspart  0-9 Units Subcutaneous TID WC  . insulin aspart  3 Units Subcutaneous TID WC  . mupirocin ointment   Nasal BID

## 2012-10-24 LAB — CBC
HCT: 29.5 % — ABNORMAL LOW (ref 39.0–52.0)
Hemoglobin: 9.9 g/dL — ABNORMAL LOW (ref 13.0–17.0)
MCH: 26.2 pg (ref 26.0–34.0)
MCHC: 33.6 g/dL (ref 30.0–36.0)
MCV: 78 fL (ref 78.0–100.0)
Platelets: 255 10*3/uL (ref 150–400)
RBC: 3.78 MIL/uL — ABNORMAL LOW (ref 4.22–5.81)
RDW: 13.2 % (ref 11.5–15.5)
WBC: 12.5 10*3/uL — ABNORMAL HIGH (ref 4.0–10.5)

## 2012-10-24 LAB — RENAL FUNCTION PANEL
Albumin: 3 g/dL — ABNORMAL LOW (ref 3.5–5.2)
BUN: 67 mg/dL — ABNORMAL HIGH (ref 6–23)
CO2: 22 mEq/L (ref 19–32)
Calcium: 8.4 mg/dL (ref 8.4–10.5)
Chloride: 90 mEq/L — ABNORMAL LOW (ref 96–112)
Creatinine, Ser: 12.26 mg/dL — ABNORMAL HIGH (ref 0.50–1.35)
GFR calc Af Amer: 5 mL/min — ABNORMAL LOW (ref >90)
GFR calc non Af Amer: 4 mL/min — ABNORMAL LOW (ref >90)
Glucose, Bld: 148 mg/dL — ABNORMAL HIGH (ref 70–99)
Phosphorus: 4.2 mg/dL (ref 2.3–4.6)
Potassium: 3.7 mEq/L (ref 3.5–5.1)
Sodium: 131 mEq/L — ABNORMAL LOW (ref 135–145)

## 2012-10-24 LAB — GLUCOSE, CAPILLARY
Glucose-Capillary: 137 mg/dL — ABNORMAL HIGH (ref 70–99)
Glucose-Capillary: 123 mg/dL — ABNORMAL HIGH (ref 70–99)

## 2012-10-24 MED ORDER — FENOFIBRATE 160 MG PO TABS: 160.0000 mg | ORAL_TABLET | Freq: Every day | ORAL | Status: DC

## 2012-10-24 MED ORDER — HYDROCODONE-ACETAMINOPHEN 5-325 MG PO TABS: 1.0000 | ORAL_TABLET | ORAL | Status: DC | PRN

## 2012-10-24 MED ORDER — CALCIUM CARBONATE ANTACID 500 MG PO CHEW: 1.0000 | CHEWABLE_TABLET | Freq: Three times a day (TID) | ORAL | Status: DC

## 2012-10-24 MED ORDER — DOXERCALCIFEROL 4 MCG/2ML IV SOLN
INTRAVENOUS | Status: AC
  Administered 2012-10-24: 6 ug via INTRAVENOUS
  Filled 2012-10-24: qty 4

## 2012-10-24 MED ORDER — HYDROCODONE-ACETAMINOPHEN 5-325 MG PO TABS
ORAL_TABLET | ORAL | Status: AC
  Administered 2012-10-24: 2 via ORAL
  Filled 2012-10-24: qty 2

## 2012-10-24 MED ORDER — CALCIUM CARBONATE ANTACID 500 MG PO CHEW: 2.0000 | CHEWABLE_TABLET | Freq: Three times a day (TID) | ORAL | Status: DC

## 2012-10-24 MED ORDER — SIMVASTATIN 80 MG PO TABS: 80.0000 mg | ORAL_TABLET | Freq: Every evening | ORAL | Status: AC

## 2012-10-24 NOTE — Progress Notes (Signed)
Discussed discharge instructions and medications with pt. Pt showed no barriers to discharge. IV removed. Pt discharged to home with family. Assessment unchanged from morning. Medications verified with Dr. Moshe Cipro and Dr. Harlow Asa prior to discharge.

## 2012-10-24 NOTE — Progress Notes (Signed)
[facility] Progress Note  Subjective:   Seen on HD- no new complaints.  Calcium is stable through the day yest- should be OK for discharge after HD today.   Objective Filed Vitals:   10/24/12 0553 10/24/12 0800 10/24/12 0817 10/24/12 0830  BP: 152/72 159/86 173/88 181/83  Pulse: 94 93 90 81  Temp: 98.6 F (37 C) 97.8 F (36.6 C)    TempSrc: Oral Oral    Resp: 20 18    Height:      Weight:  105.15 kg (231 lb 13 oz)    SpO2: 96% 97%     Physical Exam General:NAD Heart: RRR Lungs: no rales Abdomen: obese soft Extremities: no LE edmea Dialysis Access: right upper AVF with buttonholes + bruit  Dialysis Orders: Center: [facility] MWF Optiflux 200 500/800 EDW 103 2K 2.25 profil2 2 right AVF buttonhole epo 1000 hectorol just increased to 8 from 6  Recent labs: Hgb 10.9 4/28 ferritin 2393 4/09 49% sat iPTh 471 4/09   Assessment/Plan: 1. 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 D/c sensipar and fosrenol permanently; Ca dropping 10.8>9.5>7.6>10.5 (post HD)>8.9>9.3 this am is pending.  dosing hectorol 6. On CACO3 suspension 1000 mg ac and HS. Diet advanced -  2. ESRD - MWF  Ca spike after HD Wed due to IV hectorol; drifting back down; recheck this pm and again pre HD Friday; given supplemental K to see if we can get K high enough to be on an added Ca bath - will start on added Ca and change if K too low.  3. Hypertension/volume - volume control. Not on any antihypertensives- bed weight pre HD today - can stand on Friday UF 3.2 Wed. 4. Anemia - Hgb 11.9>10.3 - will give 6.25 Aranesp (1000Epo as outpt)- recheck Friday 5. Nutrition - pureed diet due to swallowing soreness - resume multivit at d/c 6. Diabetes - hold glipizide and cover with SSI - BS ok 7. Hyperlipidemia - on fenofibrate and simvastatin- hold to limit pill burden for now- resume at d/c       8.   Disp - if calc stable this AM- OK for  discharge from my standpoint- continue calc carbonate suspension QID and we can follow calc at OP unit (q treatment for a while) and adjust as needed.    Wilfred Siverson A   10/24/2012,8:42 AM  LOS: 3 days       Additional Objective Labs: Basic Metabolic Panel:  Recent Labs Lab 10/22/12 1522 10/23/12 0634 10/23/12 1654  NA 136 134* 135  K 3.9 3.4* 3.8  CL 94* 93* 93*  CO2 30 26 25   GLUCOSE 177* 155* 117*  BUN 29* 43* 54*  CREATININE 6.38* 9.28* 10.38*  CALCIUM 10.5 8.9 9.3  PHOS 4.1 4.5 3.9   Liver Function Tests:  Recent Labs Lab 10/22/12 1522 10/23/12 0634 10/23/12 1654  ALBUMIN 3.5 3.1* 3.3*  CBC:  Recent Labs Lab 10/21/12 1052 10/22/12 0657  WBC 9.9 14.3*  HGB 11.9* 10.3*  HCT 35.0* 31.0*  MCV 77.6* 77.3*  PLT 294 239  CBG:  Recent Labs Lab 10/23/12 0736 10/23/12 1154 10/23/12 1704 10/23/12 2202 10/24/12 0742  GLUCAP 147* 141* 123* 148* 137*  Medications: . sodium chloride 35 mL/hr (10/21/12 1151)  . sodium chloride 20 mL/hr (10/21/12 1730)   . calcium carbonate (dosed in mg elemental calcium)  1,000 mg of elemental calcium Oral TID AC & HS  . darbepoetin (ARANESP) injection -  DIALYSIS  6.5476 mcg Intravenous Q Wed-HD  . doxercalciferol  6 mcg Intravenous Q M,W,F-HD  . insulin aspart  0-9 Units Subcutaneous TID WC  . insulin aspart  3 Units Subcutaneous TID WC  . mupirocin ointment   Nasal BID

## 2012-10-24 NOTE — Discharge Summary (Signed)
Physician Discharge Summary [facility]  Patient ID: James Brown MRN: [id number] DOB/AGE: 1965/12/17 47 y.o.  Admit date: 10/21/2012 Discharge date: 10/24/2012  Admission Diagnoses:  Secondary hyperparathyroidism, ESRD  Discharge Diagnoses:  Principal Problem:   Hyperparathyroidism, secondary Active Problems:   ESRD   Type II or unspecified type diabetes mellitus with ketoacidosis, not stated as uncontrolled   Discharged Condition: good  Hospital Course: patient admitted after total parathyroidectomy with autotransplantation to forearm.  Post op course uncomplicated.  Tolerated diet.  Calcium levels fell significantly and then stablilized with HD adjustments.  Prepared for discharge home on POD#3.  Consults: nephrology  Significant Diagnostic Studies: labs  Treatments: dialysis: Hemodialysis  Discharge Exam: Blood pressure 146/80, pulse 102, temperature 98.7 F (37.1 C), temperature source Oral, resp. rate 18, height 5\' 7"  (1.702 m), weight 223 lb 8.7 oz (101.4 kg), SpO2 100.00%. HEENT - clear Neck - wound clear and dry; minimal STS; voice normal Ext - left forearm wound clear and dry  Disposition: Home with family  Discharge Orders   Future Appointments Provider Department Dept Phone   11/10/2012 11:15 AM Earnstine Regal, MD [facility], [place] [phone]   Future Orders Complete By Expires     Diet - low sodium heart healthy  As directed     Discharge instructions  As directed     Comments:      THYROID & PARATHYROID SURGERY - POST OP INSTRUCTIONS  Always review your discharge instruction sheet from the facility where your surgery was performed.  A prescription for pain medication may be given to you upon discharge.  Take your pain medication as prescribed.  If narcotic pain medicine is not needed, then you may take acetaminophen (Tylenol) or ibuprofen (Advil) as needed.  Take your usually prescribed medications unless  otherwise directed.  If you need a refill on your pain medication, please contact your pharmacy. They will contact our office to request authorization.  Prescriptions will not be processed after 5 pm or on weekends.  Start with a light diet upon arrival home, such as soup and crackers or toast.  Be sure to drink plenty of fluids daily.  Resume your normal diet the day after surgery.  Most patients will experience some swelling and bruising on the chest and neck area.  Ice packs will help.  Swelling and bruising can take several days to resolve.   It is common to experience some constipation if taking pain medication after surgery.  Increasing fluid intake and taking a stool softener will usually help or prevent this problem.  A mild laxative (Milk of Magnesia or Miralax) should be taken according to package directions if there are no bowel movements after 48 hours.  You may remove your bandages 24-48 hours after surgery, and you may shower at that time.  You have steri-strips (small skin tapes) in place directly over the incision.  These strips should be left on the skin for 7-10 days and then removed.  You may resume regular (light) daily activities beginning the next day-such as daily self-care, walking, climbing stairs-gradually increasing activities as tolerated.  You may have sexual intercourse when it is comfortable.  Refrain from any heavy lifting or straining until approved by your doctor.  You may drive when you no longer are taking prescription pain medication, you can comfortably wear a seatbelt, and you can safely maneuver your car and apply brakes.  You should see your doctor in the office for a follow-up appointment approximately  two to three weeks after your surgery.  Make sure that you call for this appointment within a day or two after you arrive home to insure a convenient appointment time.  WHEN TO CALL YOUR DOCTOR: -- Fever greater than 101.5 -- Inability to urinate -- Nausea  and/or vomiting - persistent -- Extreme swelling or bruising -- Continued bleeding from incision -- Increased pain, redness, or drainage from the incision -- Difficulty swallowing or breathing -- Muscle cramping or spasms -- Numbness or tingling in hands or around lips  The clinic staff is available to answer your questions during regular business hours.  Please don't hesitate to call and ask to speak to one of the nurses if you have concerns.  Earnstine Regal, MD, [facility] Office: [phone]    Increase activity slowly  As directed     No dressing needed  As directed         Medication List    TAKE these medications       HYDROcodone-acetaminophen 5-325 MG per tablet  Commonly known as:  NORCO/VICODIN  Take 1-2 tablets by mouth every 4 (four) hours as needed for pain.      ASK your doctor about these medications       cinacalcet 90 MG tablet  Commonly known as:  SENSIPAR  Take 90 mg by mouth daily.     doxycycline 100 MG DR capsule  Commonly known as:  DORYX  Take 100 mg by mouth daily.     fenofibrate 160 MG tablet  Take 160 mg by mouth daily.     FOSRENOL PO  Take 1 tablet by mouth 3 (three) times daily before meals.     simvastatin 80 MG tablet  Commonly known as:  ZOCOR  Take 80 mg by mouth at bedtime.     TUMS ULTRA PO  Take by mouth 3 (three) times daily.         Earnstine Regal, MD, [facility] Office: [phone]   Signed: Earnstine Regal 10/24/2012, 12:36 PM

## 2012-10-24 NOTE — Procedures (Signed)
Patient was seen on dialysis and the procedure was supervised.  BFR 400  Via AVF BP is  163/90.   Patient appears to be tolerating treatment well  James Brown A 10/24/2012

## 2012-11-10 ENCOUNTER — Encounter (INDEPENDENT_AMBULATORY_CARE_PROVIDER_SITE_OTHER): Payer: Self-pay | Admitting: Surgery

## 2012-11-10 ENCOUNTER — Ambulatory Visit (INDEPENDENT_AMBULATORY_CARE_PROVIDER_SITE_OTHER): Payer: Medicare Other | Admitting: Surgery

## 2012-11-10 VITALS — BP 146/86 | HR 99 | Temp 96.5°F | Ht 67.0 in | Wt 230.8 lb

## 2012-11-10 DIAGNOSIS — N186 End stage renal disease: Secondary | ICD-10-CM

## 2012-11-10 DIAGNOSIS — N2581 Secondary hyperparathyroidism of renal origin: Secondary | ICD-10-CM

## 2012-11-10 NOTE — Progress Notes (Signed)
General Surgery [facility]  Visit Diagnoses: 1. Hyperparathyroidism, secondary   2. ESRD     HISTORY: Patient returns for her first postoperative visit having undergone total parathyroidectomy on 10/21/2012.  EXAM: Surgical wounds on the neck and left forearm are well-healed. No sign of seroma. No sign of infection. Voice quality is normal.  IMPRESSION: Status post total parathyroidectomy for secondary hyperparathyroidism  PLAN: Patient will apply topical creams to his incisions. He will return for surgical care as needed.  Earnstine Regal, MD, [facility]

## 2012-11-10 NOTE — Patient Instructions (Signed)
  COCOA BUTTER & VITAMIN E CREAM  ([employer] or other brand)  Apply cocoa butter/vitamin E cream to your incision 2 - 3 times daily.  Massage cream into incision for one minute with each application.  Use sunscreen (50 SPF or higher) for first 6 months after surgery if area is exposed to sun.  You may substitute Mederma or other scar reducing creams as desired.   

## 2012-12-09 ENCOUNTER — Encounter (INDEPENDENT_AMBULATORY_CARE_PROVIDER_SITE_OTHER): Payer: Self-pay

## 2013-01-01 ENCOUNTER — Ambulatory Visit
Admission: RE | Admit: 2013-01-01 | Discharge: 2013-01-01 | Disposition: A | Discharge status: Home / Self Care | Payer: Medicare Other | Source: Ambulatory Visit | Attending: Nephrology | Admitting: Nephrology

## 2013-01-01 ENCOUNTER — Other Ambulatory Visit: Payer: Self-pay | Admitting: Nephrology

## 2013-01-01 DIAGNOSIS — R7611 Nonspecific reaction to tuberculin skin test without active tuberculosis: Secondary | ICD-10-CM

## 2013-08-27 ENCOUNTER — Telehealth: Payer: Self-pay

## 2013-08-27 ENCOUNTER — Ambulatory Visit: Payer: Medicare Other | Admitting: Vascular Surgery

## 2013-08-27 NOTE — Telephone Encounter (Signed)
Phone call from nurse at [facility].  Stated Dr. Mercy Moore is requesting an appt. ASAP for pt. Regarding an infected aneurysm of right arm AVF.  Stated the site was cultured today at the kidney center.  Appt. Given for 1:45 PM today for evaluation.

## 2013-08-31 ENCOUNTER — Encounter: Payer: Self-pay | Admitting: Vascular Surgery

## 2013-09-01 ENCOUNTER — Encounter: Payer: Self-pay | Admitting: *Deleted

## 2013-09-01 ENCOUNTER — Ambulatory Visit (INDEPENDENT_AMBULATORY_CARE_PROVIDER_SITE_OTHER): Payer: Medicare Other | Admitting: Vascular Surgery

## 2013-09-01 ENCOUNTER — Other Ambulatory Visit: Payer: Self-pay | Admitting: *Deleted

## 2013-09-01 ENCOUNTER — Encounter: Payer: Self-pay | Admitting: Vascular Surgery

## 2013-09-01 VITALS — BP 106/79 | HR 125 | Resp 16 | Ht 67.0 in | Wt 227.0 lb

## 2013-09-01 DIAGNOSIS — T827XXA Infection and inflammatory reaction due to other cardiac and vascular devices, implants and grafts, initial encounter: Secondary | ICD-10-CM | POA: Insufficient documentation

## 2013-09-01 NOTE — Progress Notes (Signed)
Subjective:     Patient ID: James Brown, male   DOB: 03-27-1966, 48 y.o.   MRN: [id number]  HPI this 48 year old male with end-stage renal disease has a right upper arm fistula which has been present for 10 years. He is left-handed. He has had one procedure to resect an aneurysm by me in 2013. He had a second large complex aneurysm in the proximal upper arm which is now become infected with some mild saline this. He is receiving antibiotics on dialysis which he has on Tuesday Thursday and Saturday. He has had no chills and fever. He has had no bleeding.  Past Medical History  Diagnosis Date  . Hypertension   . Hyperlipidemia   . Dermatitis   . Tinea versicolor   . AAA (abdominal aortic aneurysm)   . ESRD (end stage renal disease) on dialysis     started 09/2006 MWF [facility]  . Diabetes     History  Substance Use Topics  . Smoking status: Never Smoker   . Smokeless tobacco: Never Used  . Alcohol Use: No    No family history on file.  No Known Allergies  Current outpatient prescriptions:calcium carbonate (TUMS) 500 MG chewable tablet, Chew 2 tablets (400 mg of elemental calcium total) by mouth 4 (four) times daily -  with meals and at bedtime., Disp: 90 tablet, Rfl: 3;  simvastatin (ZOCOR) 80 MG tablet, Take 1 tablet (80 mg total) by mouth every evening., Disp: 30 tablet, Rfl: 11  BP 106/79  Pulse 125  Resp 16  Ht 5\' 7"  (1.702 m)  Wt 227 lb (102.967 kg)  BMI 35.55 kg/m2  Body mass index is 35.55 kg/(m^2).          Review of Systems denies chest pain, dyspnea on exertion, PND, orthopnea,, hemoptysis, lateralizing weakness, aphasia, amaurosis fugax, patient has unremarkable review of systems all systems reviewed    Objective:   Physical Exam BP 106/79  Pulse 125  Resp 16  Ht 5\' 7"  (1.702 m)  Wt 227 lb (102.967 kg)  BMI 35.55 kg/m2  Gen.-alert and oriented x3 in no apparent distress HEENT normal for age Lungs no rhonchi or wheezing Cardiovascular regular  rhythm no murmurs carotid pulses 3+ palpable no bruits audible Abdomen soft nontender no palpable masses Musculoskeletal free of  major deformities Skin clear -no rashes Neurologic normal Lower extremities 3+ femoral and dorsalis pedis pulses palpable bilaterally with no edema Right upper extremity with functioning brachial to cephalic AV fistula. The most distal aspect of the fistula is dilated where previous aneurysm resection was performed. More proximally there were 2 complex aneurysm measuring up to 4 cm in diameter with one area of ulcerated skin and some mild erythema overlying this. This is quite tortuous.        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 Plan revision of right upper arm fistula with rerouting using 6 mm Gore-Tex graft. Possibility of infection of graft has been discussed the patient understands this. Hopefully this will allow further time for access using right upper extremity before going to dominant left upper extremity for fistula. Schedule this for Friday, March 13

## 2013-09-03 MED ORDER — SODIUM CHLORIDE 0.9 % IV SOLN: INTRAVENOUS | Status: DC

## 2013-09-03 MED ORDER — DEXTROSE 5 % IV SOLN
1.5000 g | INTRAVENOUS | Status: AC
  Administered 2013-09-04: 1.5 g via INTRAVENOUS
  Filled 2013-09-03: qty 1.5

## 2013-09-03 NOTE — Progress Notes (Signed)
Several unsuccessful attempts were made to contact pt. LVM with pre-op instructions according to [facility] pre-op checklist.

## 2013-09-04 ENCOUNTER — Encounter (HOSPITAL_COMMUNITY): Payer: Medicare Other | Admitting: Certified Registered"

## 2013-09-04 ENCOUNTER — Encounter (HOSPITAL_COMMUNITY)
Admission: RE | Disposition: A | Discharge status: Home / Self Care | Payer: Self-pay | Source: Ambulatory Visit | Attending: Vascular Surgery

## 2013-09-04 ENCOUNTER — Encounter (HOSPITAL_COMMUNITY): Payer: Self-pay | Admitting: Certified Registered"

## 2013-09-04 ENCOUNTER — Other Ambulatory Visit: Payer: Self-pay

## 2013-09-04 ENCOUNTER — Ambulatory Visit (HOSPITAL_COMMUNITY): Payer: Medicare Other | Admitting: Certified Registered"

## 2013-09-04 ENCOUNTER — Ambulatory Visit (HOSPITAL_COMMUNITY)
Admission: RE | Admit: 2013-09-04 | Discharge: 2013-09-04 | Disposition: A | Discharge status: Home / Self Care | Payer: Medicare Other | Source: Ambulatory Visit | Attending: Vascular Surgery | Admitting: Vascular Surgery

## 2013-09-04 DIAGNOSIS — B360 Pityriasis versicolor: Secondary | ICD-10-CM | POA: Insufficient documentation

## 2013-09-04 DIAGNOSIS — Y832 Surgical operation with anastomosis, bypass or graft as the cause of abnormal reaction of the patient, or of later complication, without mention of misadventure at the time of the procedure: Secondary | ICD-10-CM | POA: Insufficient documentation

## 2013-09-04 DIAGNOSIS — I721 Aneurysm of artery of upper extremity: Secondary | ICD-10-CM | POA: Insufficient documentation

## 2013-09-04 DIAGNOSIS — Z992 Dependence on renal dialysis: Secondary | ICD-10-CM | POA: Insufficient documentation

## 2013-09-04 DIAGNOSIS — G8918 Other acute postprocedural pain: Secondary | ICD-10-CM

## 2013-09-04 DIAGNOSIS — E785 Hyperlipidemia, unspecified: Secondary | ICD-10-CM | POA: Insufficient documentation

## 2013-09-04 DIAGNOSIS — I120 Hypertensive chronic kidney disease with stage 5 chronic kidney disease or end stage renal disease: Secondary | ICD-10-CM | POA: Insufficient documentation

## 2013-09-04 DIAGNOSIS — I714 Abdominal aortic aneurysm, without rupture, unspecified: Secondary | ICD-10-CM | POA: Insufficient documentation

## 2013-09-04 DIAGNOSIS — T82898A Other specified complication of vascular prosthetic devices, implants and grafts, initial encounter: Secondary | ICD-10-CM | POA: Insufficient documentation

## 2013-09-04 DIAGNOSIS — E119 Type 2 diabetes mellitus without complications: Secondary | ICD-10-CM | POA: Insufficient documentation

## 2013-09-04 DIAGNOSIS — L259 Unspecified contact dermatitis, unspecified cause: Secondary | ICD-10-CM | POA: Insufficient documentation

## 2013-09-04 DIAGNOSIS — G4730 Sleep apnea, unspecified: Secondary | ICD-10-CM | POA: Insufficient documentation

## 2013-09-04 DIAGNOSIS — I739 Peripheral vascular disease, unspecified: Secondary | ICD-10-CM | POA: Insufficient documentation

## 2013-09-04 DIAGNOSIS — N186 End stage renal disease: Secondary | ICD-10-CM

## 2013-09-04 HISTORY — PX: AV FISTULA PLACEMENT: SHX1204

## 2013-09-04 LAB — GLUCOSE, CAPILLARY: Glucose-Capillary: 156 mg/dL — ABNORMAL HIGH (ref 70–99)

## 2013-09-04 LAB — POCT I-STAT 4, (NA,K, GLUC, HGB,HCT)
Glucose, Bld: 148 mg/dL — ABNORMAL HIGH (ref 70–99)
HCT: 33 % — ABNORMAL LOW (ref 39.0–52.0)
HEMOGLOBIN: 11.2 g/dL — AB (ref 13.0–17.0)
Potassium: 4.9 mEq/L (ref 3.7–5.3)
SODIUM: 139 meq/L (ref 137–147)

## 2013-09-04 SURGERY — INSERTION OF ARTERIOVENOUS (AV) GORE-TEX GRAFT ARM
Anesthesia: General | Site: Arm Upper | Laterality: Right

## 2013-09-04 MED ORDER — PROPOFOL 10 MG/ML IV BOLUS
INTRAVENOUS | Status: DC | PRN
  Administered 2013-09-04: 180 mg via INTRAVENOUS

## 2013-09-04 MED ORDER — LIDOCAINE-EPINEPHRINE (PF) 1 %-1:200000 IJ SOLN
INTRAMUSCULAR | Status: DC | PRN
  Administered 2013-09-04: 30 mL

## 2013-09-04 MED ORDER — LIDOCAINE HCL (CARDIAC) 20 MG/ML IV SOLN
INTRAVENOUS | Status: AC
  Filled 2013-09-04: qty 5

## 2013-09-04 MED ORDER — 0.9 % SODIUM CHLORIDE (POUR BTL) OPTIME
TOPICAL | Status: DC | PRN
  Administered 2013-09-04: 1000 mL

## 2013-09-04 MED ORDER — FENTANYL CITRATE 0.05 MG/ML IJ SOLN
INTRAMUSCULAR | Status: AC
  Filled 2013-09-04: qty 5

## 2013-09-04 MED ORDER — LIDOCAINE HCL (CARDIAC) 20 MG/ML IV SOLN
INTRAVENOUS | Status: DC | PRN
  Administered 2013-09-04: 100 mg via INTRAVENOUS

## 2013-09-04 MED ORDER — SODIUM CHLORIDE 0.9 % IR SOLN
Status: DC | PRN
  Administered 2013-09-04: 07:00:00

## 2013-09-04 MED ORDER — OXYCODONE-ACETAMINOPHEN 5-325 MG PO TABS: 1.0000 | ORAL_TABLET | ORAL | Status: DC | PRN

## 2013-09-04 MED ORDER — FENTANYL CITRATE 0.05 MG/ML IJ SOLN
INTRAMUSCULAR | Status: DC | PRN
  Administered 2013-09-04 (×2): 50 ug via INTRAVENOUS
  Administered 2013-09-04: 25 ug via INTRAVENOUS
  Administered 2013-09-04: 50 ug via INTRAVENOUS

## 2013-09-04 MED ORDER — MIDAZOLAM HCL 5 MG/5ML IJ SOLN
INTRAMUSCULAR | Status: DC | PRN
  Administered 2013-09-04 (×2): 1 mg via INTRAVENOUS

## 2013-09-04 MED ORDER — PROPOFOL 10 MG/ML IV BOLUS
INTRAVENOUS | Status: AC
  Filled 2013-09-04: qty 20

## 2013-09-04 MED ORDER — SODIUM CHLORIDE 0.9 % IV SOLN
INTRAVENOUS | Status: DC | PRN
  Administered 2013-09-04: 07:00:00 via INTRAVENOUS

## 2013-09-04 MED ORDER — PHENYLEPHRINE HCL 10 MG/ML IJ SOLN
10.0000 mg | INTRAMUSCULAR | Status: DC | PRN
  Administered 2013-09-04: 10 ug/min via INTRAVENOUS

## 2013-09-04 MED ORDER — MIDAZOLAM HCL 2 MG/2ML IJ SOLN
INTRAMUSCULAR | Status: AC
  Filled 2013-09-04: qty 2

## 2013-09-04 MED ORDER — ONDANSETRON HCL 4 MG/2ML IJ SOLN
INTRAMUSCULAR | Status: DC | PRN
  Administered 2013-09-04: 4 mg via INTRAVENOUS

## 2013-09-04 MED ORDER — OXYCODONE-ACETAMINOPHEN 5-500 MG PO CAPS: 1.0000 | ORAL_CAPSULE | ORAL | Status: DC | PRN

## 2013-09-04 SURGICAL SUPPLY — 48 items
ADH SKN CLS APL DERMABOND .7 (GAUZE/BANDAGES/DRESSINGS) ×1
ARMBAND PINK RESTRICT EXTREMIT (MISCELLANEOUS) ×3 IMPLANT
BLADE SURG 15 STRL LF DISP TIS (BLADE) IMPLANT
BLADE SURG 15 STRL SS (BLADE) ×3
BLADE SURG ROTATE 9660 (MISCELLANEOUS) ×2 IMPLANT
CANISTER SUCTION 2500CC (MISCELLANEOUS) ×3 IMPLANT
CLIP TI MEDIUM 6 (CLIP) ×3 IMPLANT
CLIP TI WIDE RED SMALL 6 (CLIP) ×3 IMPLANT
COVER SURGICAL LIGHT HANDLE (MISCELLANEOUS) ×3 IMPLANT
DECANTER SPIKE VIAL GLASS SM (MISCELLANEOUS) ×1 IMPLANT
DERMABOND ADVANCED (GAUZE/BANDAGES/DRESSINGS) ×2
DERMABOND ADVANCED .7 DNX12 (GAUZE/BANDAGES/DRESSINGS) ×1 IMPLANT
DRSG TEGADERM 2-3/8X2-3/4 SM (GAUZE/BANDAGES/DRESSINGS) ×4 IMPLANT
DRSG TEGADERM 4X4.75 (GAUZE/BANDAGES/DRESSINGS) ×2 IMPLANT
ELECT REM PT RETURN 9FT ADLT (ELECTROSURGICAL) ×3
ELECTRODE REM PT RTRN 9FT ADLT (ELECTROSURGICAL) ×1 IMPLANT
GEL ULTRASOUND 20GR AQUASONIC (MISCELLANEOUS) ×3 IMPLANT
GLOVE BIO SURGEON STRL SZ8 (GLOVE) ×2 IMPLANT
GLOVE BIOGEL PI IND STRL 6.5 (GLOVE) IMPLANT
GLOVE BIOGEL PI IND STRL 7.0 (GLOVE) IMPLANT
GLOVE BIOGEL PI INDICATOR 6.5 (GLOVE) ×2
GLOVE BIOGEL PI INDICATOR 7.0 (GLOVE) ×2
GLOVE SS BIOGEL STRL SZ 7 (GLOVE) ×1 IMPLANT
GLOVE SUPERSENSE BIOGEL SZ 7 (GLOVE) ×4
GOWN STRL REUS W/ TWL LRG LVL3 (GOWN DISPOSABLE) ×3 IMPLANT
GOWN STRL REUS W/ TWL XL LVL3 (GOWN DISPOSABLE) IMPLANT
GOWN STRL REUS W/TWL LRG LVL3 (GOWN DISPOSABLE) ×3
GOWN STRL REUS W/TWL XL LVL3 (GOWN DISPOSABLE) ×3
GRAFT GORETEX 8X70 (Vascular Products) ×2 IMPLANT
KIT BASIN OR (CUSTOM PROCEDURE TRAY) ×3 IMPLANT
KIT ROOM TURNOVER OR (KITS) ×3 IMPLANT
NS IRRIG 1000ML POUR BTL (IV SOLUTION) ×3 IMPLANT
PACK CV ACCESS (CUSTOM PROCEDURE TRAY) ×3 IMPLANT
PAD ARMBOARD 7.5X6 YLW CONV (MISCELLANEOUS) ×6 IMPLANT
SUT PROLENE 6 0 BV (SUTURE) ×6 IMPLANT
SUT PROLENE 6 0 C 1 24 (SUTURE) ×3 IMPLANT
SUT PROLENE 6 0 CC (SUTURE) ×2 IMPLANT
SUT SILK  1 MH (SUTURE) ×2
SUT SILK 0 TIES 10X30 (SUTURE) ×3 IMPLANT
SUT SILK 1 MH (SUTURE) IMPLANT
SUT VIC AB 2-0 CT1 27 (SUTURE) ×3
SUT VIC AB 2-0 CT1 TAPERPNT 27 (SUTURE) IMPLANT
SUT VIC AB 3-0 SH 27 (SUTURE) ×6
SUT VIC AB 3-0 SH 27X BRD (SUTURE) ×2 IMPLANT
TOWEL OR 17X24 6PK STRL BLUE (TOWEL DISPOSABLE) ×3 IMPLANT
TOWEL OR 17X26 10 PK STRL BLUE (TOWEL DISPOSABLE) ×3 IMPLANT
UNDERPAD 30X30 INCONTINENT (UNDERPADS AND DIAPERS) ×3 IMPLANT
WATER STERILE IRR 1000ML POUR (IV SOLUTION) ×3 IMPLANT

## 2013-09-04 NOTE — Op Note (Signed)
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 Right upper extremity was prepped Betadine scrub and solution draped in routine sterile manner. There was a large complex pseudoaneurysm involving the entire midportion of the right brachiocephalic AV fistula which was functioning. It was ulcerated and wasn'  4 cm in diameter in its largest area. The most distal portion of the fistula near the arterial anastomosis had been previously revised with aneurysmorrhaphy and this was suitable to retain. About a 8-10 cm segment of the fistula was preserved after infiltration of 1% Xylocaine with epinephrine a short incision was made over the fistula proximal to the large complex pseudoaneurysm. Fistula was dissected free and was very dilated probably A. denies meters in size. Second incision was made near the deltopectoral groove in the shoulder area after infiltration with Xylocaine the vein was dissected free in this area as well. A tunnel was then created lateral to the existing fistula and an 8 mm Gore-Tex graft delivered through the tunnel. No heparin was given. The fistula was occluded and both incisions and the in the distal portion was ligated with 0 silk tie. Fistula was transected an 8 mm Gore-Tex graft was slightly spatulated end-to-end anastomosis was done with 6-0 Prolene this was done similarly in the Morse proximal  portion near the shoulder level where the fistula was again transected and ligated toward the pseudoaneurysm. End to end anastomosis was  Pro performed clamps released there is excellent pulse and thrill in the graft and fistula. Adequate hemostasis was achieved wounds were both closed in layers with Vicryl subcuticular fashion these were covered with Tegaderm dressing. Attention turned to the large pseudoaneurysm which is now nonfunctional since fistula had been ligated in this area. Elliptical incision was made surrounding the ulcerated areas marked with a skin marker and this was carried down through skin and subcutaneous tissue with a 15 blade. The old skin was excised and removed. A large part of this aneurysm was excised. There were no branches filling the fistula sac. All following completion of this sac was obliterated with 2-0 Vicryl suture in layers and the skin closed with continuous 3-0 Vicryl in subcuticular fashion sterile dressing applied patient taken to the recovery room in stable condition there was good radial arterial flow was fistula open and with it occluded.   [name], MD 09/04/2013 9:48 AM

## 2013-09-04 NOTE — Interval H&P Note (Signed)
History and Physical Interval Note:  09/04/2013 7:32 AM  James Brown  has presented today for surgery, with the diagnosis of Right arm pseudoaneurysm  The various methods of treatment have been discussed with the patient and family. After consideration of risks, benefits and other options for treatment, the patient has consented to  Procedure(s): REVISION OF RIGHT UPPER EXTREMITY WITH INSERTION OF ARTERIOVENOUS (AV) GORE-TEX GRAFT ARM (Right) as a surgical intervention .  The patient's history has been reviewed, patient examined, no change in status, stable for surgery.  I have reviewed the patient's chart and labs.  Questions were answered to the patient's satisfaction.     [name]

## 2013-09-04 NOTE — H&P (View-Only) (Signed)
Subjective:     Patient ID: James Brown, male   DOB: [birth year], 48 y.o.   MRN: [id number]  HPI this 48 year old male with end-stage renal disease has a right upper arm fistula which has been present for 10 years. He is left-handed. He has had one procedure to resect an aneurysm by me in 2013. He had a second large complex aneurysm in the proximal upper arm which is now become infected with some mild saline this. He is receiving antibiotics on dialysis which he has on Tuesday Thursday and Saturday. He has had no chills and fever. He has had no bleeding.  Past Medical History  Diagnosis Date  . Hypertension   . Hyperlipidemia   . Dermatitis   . Tinea versicolor   . AAA (abdominal aortic aneurysm)   . ESRD (end stage renal disease) on dialysis     started 09/2006 MWF [facility]  . Diabetes     History  Substance Use Topics  . Smoking status: Never Smoker   . Smokeless tobacco: Never Used  . Alcohol Use: No    No family history on file.  No Known Allergies  Current outpatient prescriptions:calcium carbonate (TUMS) 500 MG chewable tablet, Chew 2 tablets (400 mg of elemental calcium total) by mouth 4 (four) times daily -  with meals and at bedtime., Disp: 90 tablet, Rfl: 3;  simvastatin (ZOCOR) 80 MG tablet, Take 1 tablet (80 mg total) by mouth every evening., Disp: 30 tablet, Rfl: 11  BP 106/79  Pulse 125  Resp 16  Ht 5\' 7"  (1.702 m)  Wt 227 lb (102.967 kg)  BMI 35.55 kg/m2  Body mass index is 35.55 kg/(m^2).          Review of Systems denies chest pain, dyspnea on exertion, PND, orthopnea,, hemoptysis, lateralizing weakness, aphasia, amaurosis fugax, patient has unremarkable review of systems all systems reviewed    Objective:   Physical Exam BP 106/79  Pulse 125  Resp 16  Ht 5\' 7"  (1.702 m)  Wt 227 lb (102.967 kg)  BMI 35.55 kg/m2  Gen.-alert and oriented x3 in no apparent distress HEENT normal for age Lungs no rhonchi or wheezing Cardiovascular regular  rhythm no murmurs carotid pulses 3+ palpable no bruits audible Abdomen soft nontender no palpable masses Musculoskeletal free of  major deformities Skin clear -no rashes Neurologic normal Lower extremities 3+ femoral and dorsalis pedis pulses palpable bilaterally with no edema Right upper extremity with functioning brachial to cephalic AV fistula. The most distal aspect of the fistula is dilated where previous aneurysm resection was performed. More proximally there were 2 complex aneurysm measuring up to 4 cm in diameter with one area of ulcerated skin and some mild erythema overlying this. This is quite tortuous.        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 Plan revision of right upper arm fistula with rerouting using 6 mm Gore-Tex graft. Possibility of infection of graft has been discussed the patient understands this. Hopefully this will allow further time for access using right upper extremity before going to dominant left upper extremity for fistula. Schedule this for Friday, March 13

## 2013-09-04 NOTE — Progress Notes (Signed)
Received phone call from pt's son.  Stated he took Rx to pharmacy for Oxycodone/ Acetaminophen 5/500 mg tablets, following discharge from hospital today;  was told by pharmacist that the drug no longer comes with the 500 mg strength of  Acetaminophen.  Phone call to Dr. Kellie Simmering.  Stated to write Rx for Oxycodone/ Acetaminophen 5/325 mg tablets; take one tab q 4hr prn/ pain; # 30; No refills.  Notified Alex, son, of new prescription to be picked-up at the office, via voice message.

## 2013-09-04 NOTE — Anesthesia Postprocedure Evaluation (Signed)
Anesthesia Post Note  Patient: James Brown  Procedure(s) Performed: Procedure(s) (LRB): REVISION OF RIGHT BRACHIOCEPHALIC FISTULA WITH INSERTION OF ARTERIOVENOUS (AV) GORE-TEX GRAFT ARM (Right)  Anesthesia type: general  Patient location: PACU  Post pain: Pain level controlled  Post assessment: Patient's Cardiovascular Status Stable  Last Vitals:  Filed Vitals:   09/04/13 1046  BP: 123/60  Pulse: 86  Temp: 36.7 C  Resp:     Post vital signs: Reviewed and stable  Level of consciousness: sedated  Complications: No apparent anesthesia complications

## 2013-09-04 NOTE — Anesthesia Preprocedure Evaluation (Signed)
Anesthesia Evaluation  Patient identified by MRN, date of birth, ID band Patient awake    Reviewed: Allergy & Precautions, H&P , NPO status , Patient's Chart, lab work & pertinent test results  History of Anesthesia Complications Negative for: history of anesthetic complications  Airway Mallampati: II TM Distance: >3 FB Neck ROM: Full    Dental  (+) Teeth Intact, Dental Advisory Given   Pulmonary sleep apnea ,    Pulmonary exam normal       Cardiovascular hypertension, + Peripheral Vascular Disease     Neuro/Psych negative neurological ROS  negative psych ROS   GI/Hepatic negative GI ROS, Neg liver ROS,   Endo/Other  diabetes  Renal/GU CRFRenal disease     Musculoskeletal   Abdominal   Peds  Hematology   Anesthesia Other Findings   Reproductive/Obstetrics                           Anesthesia Physical Anesthesia Plan  ASA: III  Anesthesia Plan: General   Post-op Pain Management:    Induction: Intravenous  Airway Management Planned: LMA  Additional Equipment:   Intra-op Plan:   Post-operative Plan: Extubation in OR  Informed Consent: I have reviewed the patients History and Physical, chart, labs and discussed the procedure including the risks, benefits and alternatives for the proposed anesthesia with the patient or authorized representative who has indicated his/her understanding and acceptance.   Dental advisory given  Plan Discussed with: CRNA, Anesthesiologist and Surgeon  Anesthesia Plan Comments:         Anesthesia Quick Evaluation

## 2013-09-04 NOTE — Anesthesia Procedure Notes (Signed)
Procedure Name: LMA Insertion Date/Time: 09/04/2013 7:47 AM Performed by: Octavio Graves Pre-anesthesia Checklist: Patient identified, Emergency Drugs available, Suction available, Patient being monitored and Timeout performed Patient Re-evaluated:Patient Re-evaluated prior to inductionOxygen Delivery Method: Circle system utilized Preoxygenation: Pre-oxygenation with 100% oxygen Intubation Type: IV induction Ventilation: Mask ventilation without difficulty LMA: LMA inserted LMA Size: 4.0 Number of attempts: 1 Placement Confirmation: positive ETCO2 and breath sounds checked- equal and bilateral Tube secured with: Tape Dental Injury: Teeth and Oropharynx as per pre-operative assessment  Comments: IV induction Tobias Alexander- LMA AM CRNA- atraumatic- teeth and mouth as preop

## 2013-09-04 NOTE — Transfer of Care (Signed)
Immediate Anesthesia Transfer of Care Note  Patient: James Brown  Procedure(s) Performed: Procedure(s): REVISION OF RIGHT BRACHIOCEPHALIC FISTULA WITH INSERTION OF ARTERIOVENOUS (AV) GORE-TEX GRAFT ARM (Right)  Patient Location: PACU  Anesthesia Type:General  Level of Consciousness: awake and alert   Airway & Oxygen Therapy: Patient Spontanous Breathing and Patient connected to nasal cannula oxygen  Post-op Assessment: Report given to PACU RN and Post -op Vital signs reviewed and stable  Post vital signs: Reviewed and stable  Complications: No apparent anesthesia complications

## 2013-09-04 NOTE — Preoperative (Signed)
Beta Blockers   Reason not to administer Beta Blockers:Not Applicable 

## 2013-09-08 ENCOUNTER — Encounter (HOSPITAL_COMMUNITY): Payer: Self-pay | Admitting: Vascular Surgery

## 2013-10-02 ENCOUNTER — Encounter: Payer: Self-pay | Admitting: Family

## 2013-10-05 ENCOUNTER — Ambulatory Visit (INDEPENDENT_AMBULATORY_CARE_PROVIDER_SITE_OTHER): Payer: Self-pay | Admitting: Family

## 2013-10-05 ENCOUNTER — Encounter: Payer: Self-pay | Admitting: Family

## 2013-10-05 VITALS — BP 142/94 | HR 87 | Temp 97.7°F | Resp 16 | Ht 67.0 in | Wt 235.0 lb

## 2013-10-05 DIAGNOSIS — T82598A Other mechanical complication of other cardiac and vascular devices and implants, initial encounter: Secondary | ICD-10-CM

## 2013-10-05 DIAGNOSIS — L539 Erythematous condition, unspecified: Secondary | ICD-10-CM

## 2013-10-05 NOTE — Progress Notes (Signed)
Established Dialysis Access  History of Present Illness  James Brown is a 48 y.o. (July 13, 1965) male patient of Dr. Kellie Simmering with end-stage renal disease has a right upper arm fistula which has been present for 10 years. He is left-handed. He has had one procedure to resect an aneurysm by Dr. Kellie Simmering in 2013. 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 He was receiving antibiotics on dialysis which he has on Tuesday Thursday and Saturday. On 09-04-13 he had a revision Rt Brachiocephalic Fistula w/ AVGG. He presents today for C/O Right post-op site redness, duration 4 weeks. He denies fever or chills, denies pain, denies steal symptoms in either hand.  Past Medical History  Diagnosis Date  . Hypertension   . Hyperlipidemia   . Dermatitis   . Tinea versicolor   . AAA (abdominal aortic aneurysm)   . ESRD (end stage renal disease) on dialysis     started 09/2006 [facility]  . Diabetes    Past Surgical History  Procedure Laterality Date  . Av fistula placement  06/2005    Right Arm  . Parathyroidectomy    . Parathyroidectomy Left 10/21/2012    Procedure: TOTAL PARATHYROIDECTOMY WITH AUTOTRANSPLANT TO THE LEFT FOREARM;  Surgeon: Earnstine Regal, MD;  Location: [place];  Service: General;  Laterality: Left;  . Av fistula placement Right 09/04/2013    Procedure: REVISION OF RIGHT BRACHIOCEPHALIC FISTULA WITH INSERTION OF ARTERIOVENOUS (AV) GORE-TEX GRAFT ARM;  Surgeon: Mal Misty, MD;  Location: [place];  Service: Vascular;  Laterality: Right;   History   Social History  . Marital Status: Married    Spouse Name: N/A    Number of Children: N/A  . Years of Education: N/A   Occupational History  . Not on file.   Social History Main Topics  . Smoking status: Never Smoker   . Smokeless tobacco: Never Used  . Alcohol Use: No  . Drug Use: No  . Sexual Activity: Not on file   Other Topics Concern  . Not on file   Social History  Narrative  . No narrative on file   Family History  Problem Relation Age of Onset  . Family history unknown: Yes   Current Outpatient Prescriptions on File Prior to Visit  Medication Sig Dispense Refill  . calcium carbonate (TUMS - DOSED IN MG ELEMENTAL CALCIUM) 500 MG chewable tablet Chew 3 tablets by mouth 3 (three) times daily with meals.      . multivitamin (RENA-VIT) TABS tablet Take 1 tablet by mouth daily.      Marland Kitchen oxyCODONE-acetaminophen (PERCOCET/ROXICET) 5-325 MG per tablet Take 1 tablet by mouth every 4 (four) hours as needed for severe pain.  30 tablet  0  . simvastatin (ZOCOR) 80 MG tablet Take 1 tablet (80 mg total) by mouth every evening.  30 tablet  11  . sodium bicarbonate 650 MG tablet Take 650 mg by mouth 2 (two) times daily.       No current facility-administered medications on file prior to visit.   No Known Allergies   On ROS today: see HPI for pertinent positives and negatives.  Physical Examination  Filed Vitals:   10/05/13 1554  BP: 142/94  Pulse: 87  Temp: 97.7 F (36.5 C)  TempSrc: Oral  Resp: 16  Height: 5\' 7"  (1.702 m)  Weight: 235 lb (106.595 kg)  SpO2: 99%   Body mass index is 36.8 kg/(m^2).  General: A&O  x 3, WD, obese male.  Pulmonary: Sym exp, good air movt, CTAB, no rales, rhonchi, or wheezing . Cardiac: RRR, Nl S1, S2, no Murmurs detected.  Vascular: Vessel Right Left  Radial Palpable Palpable  Ulnar notPalpable notPalpable   Gastrointestinal: soft, NTND  Musculoskeletal: Moves all extremities WNL, Extremities without  ischemic changes, non palpable thrill in access in right upper arm, audible bruit in access. Mild/moderate redness at right upper arm AV graft site.  Neurologic: , Motor exam as listed above  Medical Decision Making  James Brown is a 48 y.o. male who is s/p right upper arm fistula which has been present for 10 years. He is left-handed. He has had one procedure to resect an aneurysm by Dr. Kellie Simmering in  2013. 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 He was receiving antibiotics on dialysis which he has on Tuesday Thursday and Saturday. On 09-04-13 he had a revision Rt Brachiocephalic Fistula w/ AVGG. He presents today for C/O Right post-op site redness, duration 4 weeks. He denies fever or chills, denies pain, denies steal symptoms in either hand.  Dr. Trula Slade examined right upper arm access, mild cellulitis which needs antibiotics given in HD: vancomycin and Zosyn or broad spectrum IV antibiotics, dosing per HD. Follow up next week with Dr. Kellie Simmering or Dr. Trula Slade.   Vinnie Level Nickel, RN, MSN, FNP-C [facility] [place] Office: [phone]  Clinic MD: Trula Slade  10/05/2013, 4:25 PM

## 2013-10-09 ENCOUNTER — Encounter: Payer: Self-pay | Admitting: Surgery

## 2013-10-12 ENCOUNTER — Ambulatory Visit (INDEPENDENT_AMBULATORY_CARE_PROVIDER_SITE_OTHER): Payer: Self-pay | Admitting: Surgery

## 2013-10-12 ENCOUNTER — Encounter: Payer: Self-pay | Admitting: Surgery

## 2013-10-12 VITALS — BP 135/87 | HR 91 | Ht 67.0 in | Wt 235.0 lb

## 2013-10-12 DIAGNOSIS — N186 End stage renal disease: Secondary | ICD-10-CM

## 2013-10-12 NOTE — Progress Notes (Signed)
The patient comes in today for a wound check.  On 09/04/2013, he underwent revision of a right brachiocephalic fistula with insertion of a 8 mm Gore-Tex graft.  A large complex pseudoaneurysm of the right brachial artery to cephalic vein fistula was repaired.  The patient had redness over this area last week.  He has been on antibiotics and is back for a wound check.  The redness in the arm is improving with antibiotics.  There remained a good thrill within the graft/fistula.  I have recommended a another 2 weeks of antibiotics to be administered during dialysis.  The patient will followup in 2 weeks for a wound check.  Annamarie Major

## 2013-10-30 ENCOUNTER — Encounter: Payer: Self-pay | Admitting: Surgery

## 2013-11-02 ENCOUNTER — Encounter: Payer: Self-pay | Admitting: Surgery

## 2013-11-02 ENCOUNTER — Ambulatory Visit (INDEPENDENT_AMBULATORY_CARE_PROVIDER_SITE_OTHER): Payer: Self-pay | Admitting: Surgery

## 2013-11-02 VITALS — BP 122/85 | HR 93 | Ht 67.0 in | Wt 233.7 lb

## 2013-11-02 DIAGNOSIS — N186 End stage renal disease: Secondary | ICD-10-CM

## 2013-11-02 NOTE — Progress Notes (Signed)
Patient is back today for another wound check.  On 09/04/2013 he underwent repair of a large complex pseudoaneurysm involving his right brachiocephalic AV fistula which had ulcerated.  This was repaired by placing a 46mm Gore-Tex graft.  This was done by Dr. Kellie Simmering.  The patient is continuing to get antibiotics.  There was area over the resected pseudoaneurysm.  This appears to be stable.  The patient denies any systemic symptoms.  I would recommend utilization of his graft at this point.  If he has further issues with infection, consideration for graft removal and placing a fistula on the left arm would be considered.  Annamarie Major

## 2015-05-18 ENCOUNTER — Other Ambulatory Visit: Payer: Self-pay | Admitting: *Deleted

## 2015-05-18 DIAGNOSIS — Z01810 Encounter for preprocedural cardiovascular examination: Secondary | ICD-10-CM

## 2015-05-18 DIAGNOSIS — T82898D Other specified complication of vascular prosthetic devices, implants and grafts, subsequent encounter: Secondary | ICD-10-CM

## 2015-05-18 DIAGNOSIS — T82510D Breakdown (mechanical) of surgically created arteriovenous fistula, subsequent encounter: Secondary | ICD-10-CM

## 2015-05-18 DIAGNOSIS — N186 End stage renal disease: Secondary | ICD-10-CM

## 2015-05-27 ENCOUNTER — Encounter: Payer: Self-pay | Admitting: Vascular Surgery

## 2015-06-01 ENCOUNTER — Ambulatory Visit (INDEPENDENT_AMBULATORY_CARE_PROVIDER_SITE_OTHER)
Admission: RE | Admit: 2015-06-01 | Discharge: 2015-06-01 | Disposition: A | Discharge status: Home / Self Care | Payer: Medicare Other | Source: Ambulatory Visit | Attending: Vascular Surgery | Admitting: Vascular Surgery

## 2015-06-01 ENCOUNTER — Ambulatory Visit (HOSPITAL_COMMUNITY)
Admission: RE | Admit: 2015-06-01 | Discharge: 2015-06-01 | Disposition: A | Discharge status: Home / Self Care | Payer: Medicare Other | Source: Ambulatory Visit | Attending: Vascular Surgery | Admitting: Vascular Surgery

## 2015-06-01 DIAGNOSIS — N186 End stage renal disease: Secondary | ICD-10-CM

## 2015-06-01 DIAGNOSIS — T82510D Breakdown (mechanical) of surgically created arteriovenous fistula, subsequent encounter: Secondary | ICD-10-CM

## 2015-06-01 DIAGNOSIS — T82898D Other specified complication of vascular prosthetic devices, implants and grafts, subsequent encounter: Secondary | ICD-10-CM | POA: Diagnosis not present

## 2015-06-01 DIAGNOSIS — Z01810 Encounter for preprocedural cardiovascular examination: Secondary | ICD-10-CM

## 2015-06-03 ENCOUNTER — Ambulatory Visit (INDEPENDENT_AMBULATORY_CARE_PROVIDER_SITE_OTHER): Payer: Medicare Other | Admitting: Vascular Surgery

## 2015-06-03 ENCOUNTER — Encounter: Payer: Self-pay | Admitting: Vascular Surgery

## 2015-06-03 ENCOUNTER — Other Ambulatory Visit: Payer: Self-pay

## 2015-06-03 VITALS — BP 98/67 | HR 107 | Temp 98.1°F | Resp 16 | Ht 65.5 in | Wt 221.0 lb

## 2015-06-03 DIAGNOSIS — N186 End stage renal disease: Secondary | ICD-10-CM | POA: Diagnosis not present

## 2015-06-03 NOTE — Progress Notes (Signed)
[facility]  Patient name: James Brown MRN: [id number] DOB: Feb 03, 1966 Sex: male  REASON FOR CONSULT: Evaluate for hemodialysis access. Referred by Dr. Otelia Santee  HPI: James Brown is a 50 y.o. male, who is currently being dialyzed with a right IJ tunneled dialysis catheter that was placed approximately 3 weeks ago. He has had a right brachiocephalic fistula for many years but ultimately this failed and no longer be salvaged. He presents for evaluation for new access. He denies any recent uremic symptoms. Specifically he denies nausea, vomiting, fatigue, anorexia, or palpitations.  In reviewing the records, the patient did have a revision of a right brachiocephalic fistula with insertion of an AV graft using an 8 mm graft by Dr. Kellie Simmering in March 2015. 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  Past Medical History  Diagnosis Date  . Hypertension   . Hyperlipidemia   . Dermatitis   . Tinea versicolor   . AAA (abdominal aortic aneurysm) ([place])   . ESRD (end stage renal disease) on dialysis ([place])     started 09/2006 [facility]  . Diabetes ([place])     No family history on file.  SOCIAL HISTORY: Social History   Social History  . Marital Status: Married    Spouse Name: N/A  . Number of Children: N/A  . Years of Education: N/A   Occupational History  . Not on file.   Social History Main Topics  . Smoking status: Never Smoker   . Smokeless tobacco: Never Used  . Alcohol Use: No  . Drug Use: No  . Sexual Activity: Not on file   Other Topics Concern  . Not on file   Social History Narrative    No Known Allergies  Current Outpatient Prescriptions  Medication Sig Dispense Refill  . calcium carbonate (TUMS - DOSED IN MG ELEMENTAL CALCIUM) 500 MG chewable tablet Chew 3 tablets by mouth 3 (three) times daily with meals.    . multivitamin (RENA-VIT) TABS tablet Take 1 tablet by mouth  daily.    . sodium bicarbonate 650 MG tablet Take 650 mg by mouth 2 (two) times daily.    Marland Kitchen oxyCODONE-acetaminophen (PERCOCET/ROXICET) 5-325 MG per tablet Take 1 tablet by mouth every 4 (four) hours as needed for severe pain. (Patient not taking: Reported on 06/03/2015) 30 tablet 0  . simvastatin (ZOCOR) 80 MG tablet Take 1 tablet by mouth daily.     No current facility-administered medications for this visit.    REVIEW OF SYSTEMS:  [X]  denotes positive finding, [ ]  denotes negative finding Cardiac  Comments:  Chest pain or chest pressure:    Shortness of breath upon exertion:    Short of breath when lying flat:    Irregular heart rhythm:        Vascular    Pain in calf, thigh, or hip brought on by ambulation:    Pain in feet at night that wakes you up from your sleep:     Blood clot in your veins:    Leg swelling:         Pulmonary    Oxygen at home:    Productive cough:     Wheezing:         Neurologic    Sudden weakness in arms or legs:     Sudden numbness in arms or legs:     Sudden onset of difficulty speaking or slurred speech:    Temporary loss  of vision in one eye:     Problems with dizziness:         Gastrointestinal    Blood in stool:     Vomited blood:         Genitourinary    Burning when urinating:     Blood in urine:        Psychiatric    Major depression:         Hematologic    Bleeding problems:    Problems with blood clotting too easily:        Skin    Rashes or ulcers:        Constitutional    Fever or chills:      PHYSICAL EXAM: Filed Vitals:   06/03/15 1110  BP: 98/67  Pulse: 107  Temp: 98.1 F (36.7 C)  Resp: 16  Height: 5' 5.5" (1.664 m)  Weight: 221 lb (100.245 kg)  SpO2: 96%    GENERAL: The patient is a well-nourished male, in no acute distress. The vital signs are documented above. CARDIAC: There is a regular rate and rhythm.  VASCULAR: I do not detect carotid bruits. He has palpable radial pulses. PULMONARY: There is  good air exchange bilaterally without wheezing or rales. ABDOMEN: Soft and non-tender with normal pitched bowel sounds.  MUSCULOSKELETAL: There are no major deformities or cyanosis. NEUROLOGIC: No focal weakness or paresthesias are detected. SKIN: There are no ulcers or rashes noted. PSYCHIATRIC: The patient has a normal affect.  DATA:   UPPER EXTREMITY ARTERIAL DUPLEX: I have independently interpreted the upper extremity arterial duplex scan which shows triphasic Doppler signals in the radial and ulnar positions bilaterally.  UPPER EXTREMITY VEIN MAPPING: I have independently interpreted the upper extremity vein mapping. On the left side, the forearm cephalic vein looks marginal in size. The upper arm cephalic vein looks to be reasonable size as does the basilic vein.  MEDICAL ISSUES:  END-STAGE RENAL DISEASE: I think the next most logical place for access would be a left brachiocephalic AV fistula. He appears to have a reasonable upper arm cephalic vein on the left. The forearm cephalic vein is smaller and the radial artery is slightly thickened. This reason I recommended a brachiocephalic fistula. This has been scheduled for 06/10/2015. I have explained the indications for placement of an AV fistula or AV graft. I've explained that if at all possible we will place an AV fistula.  I have reviewed the risks of placement of an AV fistula including but not limited to: failure of the fistula to mature, need for subsequent interventions, and thrombosis. In addition I have reviewed the potential complications of placement of an AV graft. These risks include, but are not limited to, graft thrombosis, graft infection, wound healing problems, bleeding, arm swelling, and steal syndrome. All the patient's questions were answered and they are agreeable to proceed with surgery.   Deitra Mayo [facility] [place]: [phone]

## 2015-06-09 ENCOUNTER — Encounter (HOSPITAL_COMMUNITY): Payer: Self-pay | Admitting: *Deleted

## 2015-06-09 NOTE — Progress Notes (Signed)
   06/09/15 1829  OBSTRUCTIVE SLEEP APNEA  Score 5 or greater  Results sent to PCP

## 2015-06-09 NOTE — Progress Notes (Signed)
   06/09/15 1833  OBSTRUCTIVE SLEEP APNEA  Have you ever been diagnosed with sleep apnea through a sleep study? No  If yes, do you have and use a CPAP or BPAP machine every night? 1  Do you snore loudly (loud enough to be heard through closed doors)?  1  Has anyone observed you stop breathing during your sleep? 1  Do you have, or are you being treated for high blood pressure? 0  BMI more than 35 kg/m2? 1  Age > 60 (1-yes) 0  Male Gender (Yes=1) 1  Obstructive Sleep Apnea Score 4  Score 5 or greater  Results sent to PCP

## 2015-06-09 NOTE — Progress Notes (Signed)
Patient reports having a "cold".  " I started coughing today, it is a dry cough."  Patient denies fever or sinus drainage or headache.

## 2015-06-10 ENCOUNTER — Ambulatory Visit (HOSPITAL_COMMUNITY): Payer: Medicare Other | Admitting: Anesthesiology

## 2015-06-10 ENCOUNTER — Encounter (HOSPITAL_COMMUNITY)
Admission: RE | Disposition: A | Discharge status: Home / Self Care | Payer: Self-pay | Source: Ambulatory Visit | Attending: Vascular Surgery

## 2015-06-10 ENCOUNTER — Encounter (HOSPITAL_COMMUNITY): Payer: Self-pay

## 2015-06-10 ENCOUNTER — Ambulatory Visit (HOSPITAL_COMMUNITY)
Admission: RE | Admit: 2015-06-10 | Discharge: 2015-06-10 | Disposition: A | Discharge status: Home / Self Care | Payer: Medicare Other | Source: Ambulatory Visit | Attending: Vascular Surgery | Admitting: Vascular Surgery

## 2015-06-10 ENCOUNTER — Other Ambulatory Visit: Payer: Medicare Other | Admitting: *Deleted

## 2015-06-10 ENCOUNTER — Encounter: Payer: Self-pay | Admitting: Vascular Surgery

## 2015-06-10 DIAGNOSIS — E1122 Type 2 diabetes mellitus with diabetic chronic kidney disease: Secondary | ICD-10-CM | POA: Diagnosis not present

## 2015-06-10 DIAGNOSIS — I120 Hypertensive chronic kidney disease with stage 5 chronic kidney disease or end stage renal disease: Secondary | ICD-10-CM | POA: Insufficient documentation

## 2015-06-10 DIAGNOSIS — N186 End stage renal disease: Secondary | ICD-10-CM | POA: Insufficient documentation

## 2015-06-10 DIAGNOSIS — E785 Hyperlipidemia, unspecified: Secondary | ICD-10-CM | POA: Diagnosis not present

## 2015-06-10 DIAGNOSIS — N184 Chronic kidney disease, stage 4 (severe): Secondary | ICD-10-CM

## 2015-06-10 DIAGNOSIS — Z4931 Encounter for adequacy testing for hemodialysis: Secondary | ICD-10-CM

## 2015-06-10 DIAGNOSIS — G8918 Other acute postprocedural pain: Secondary | ICD-10-CM

## 2015-06-10 HISTORY — DX: Constipation, unspecified: K59.00

## 2015-06-10 HISTORY — PX: AV FISTULA PLACEMENT: SHX1204

## 2015-06-10 LAB — POCT I-STAT 4, (NA,K, GLUC, HGB,HCT)
GLUCOSE: 101 mg/dL — AB (ref 65–99)
HCT: 36 % — ABNORMAL LOW (ref 39.0–52.0)
Hemoglobin: 12.2 g/dL — ABNORMAL LOW (ref 13.0–17.0)
POTASSIUM: 4.6 mmol/L (ref 3.5–5.1)
Sodium: 136 mmol/L (ref 135–145)

## 2015-06-10 LAB — GLUCOSE, CAPILLARY: GLUCOSE-CAPILLARY: 86 mg/dL (ref 65–99)

## 2015-06-10 SURGERY — ARTERIOVENOUS (AV) FISTULA CREATION
Anesthesia: Monitor Anesthesia Care | Site: Arm Upper | Laterality: Left

## 2015-06-10 MED ORDER — SUCCINYLCHOLINE CHLORIDE 20 MG/ML IJ SOLN
INTRAMUSCULAR | Status: AC
  Filled 2015-06-10: qty 1

## 2015-06-10 MED ORDER — PHENYLEPHRINE 40 MCG/ML (10ML) SYRINGE FOR IV PUSH (FOR BLOOD PRESSURE SUPPORT)
PREFILLED_SYRINGE | INTRAVENOUS | Status: AC
  Filled 2015-06-10: qty 10

## 2015-06-10 MED ORDER — OXYCODONE-ACETAMINOPHEN 5-325 MG PO TABS: 1.0000 | ORAL_TABLET | ORAL | Status: DC | PRN

## 2015-06-10 MED ORDER — MIDAZOLAM HCL 2 MG/2ML IJ SOLN
INTRAMUSCULAR | Status: AC
  Filled 2015-06-10: qty 2

## 2015-06-10 MED ORDER — LIDOCAINE HCL (CARDIAC) 20 MG/ML IV SOLN
INTRAVENOUS | Status: AC
  Filled 2015-06-10: qty 5

## 2015-06-10 MED ORDER — PROPOFOL 10 MG/ML IV BOLUS
INTRAVENOUS | Status: AC
  Filled 2015-06-10: qty 40

## 2015-06-10 MED ORDER — SODIUM CHLORIDE 0.9 % IV SOLN
INTRAVENOUS | Status: DC
  Administered 2015-06-10 (×3): via INTRAVENOUS

## 2015-06-10 MED ORDER — FENTANYL CITRATE (PF) 100 MCG/2ML IJ SOLN
INTRAMUSCULAR | Status: DC | PRN
  Administered 2015-06-10 (×2): 50 ug via INTRAVENOUS

## 2015-06-10 MED ORDER — 0.9 % SODIUM CHLORIDE (POUR BTL) OPTIME
TOPICAL | Status: DC | PRN
  Administered 2015-06-10: 1000 mL

## 2015-06-10 MED ORDER — PROTAMINE SULFATE 10 MG/ML IV SOLN
INTRAVENOUS | Status: DC | PRN
  Administered 2015-06-10: 50 mg via INTRAVENOUS

## 2015-06-10 MED ORDER — PHENYLEPHRINE HCL 10 MG/ML IJ SOLN
INTRAMUSCULAR | Status: DC | PRN
  Administered 2015-06-10 (×5): 80 ug via INTRAVENOUS

## 2015-06-10 MED ORDER — DEXTROSE 5 % IV SOLN
10.0000 mg | INTRAVENOUS | Status: DC | PRN
  Administered 2015-06-10: 20 ug/min via INTRAVENOUS

## 2015-06-10 MED ORDER — LIDOCAINE HCL (PF) 1 % IJ SOLN
INTRAMUSCULAR | Status: AC
  Filled 2015-06-10: qty 30

## 2015-06-10 MED ORDER — SODIUM CHLORIDE 0.9 % IV SOLN
INTRAVENOUS | Status: DC | PRN
  Administered 2015-06-10: 08:00:00

## 2015-06-10 MED ORDER — FENTANYL CITRATE (PF) 250 MCG/5ML IJ SOLN
INTRAMUSCULAR | Status: AC
  Filled 2015-06-10: qty 5

## 2015-06-10 MED ORDER — DEXTROSE 5 % IV SOLN
1.5000 g | INTRAVENOUS | Status: AC
  Administered 2015-06-10: 1.5 g via INTRAVENOUS
  Filled 2015-06-10: qty 1.5

## 2015-06-10 MED ORDER — MIDAZOLAM HCL 5 MG/5ML IJ SOLN
INTRAMUSCULAR | Status: DC | PRN
  Administered 2015-06-10: 2 mg via INTRAVENOUS

## 2015-06-10 MED ORDER — ROCURONIUM BROMIDE 50 MG/5ML IV SOLN
INTRAVENOUS | Status: AC
  Filled 2015-06-10: qty 1

## 2015-06-10 MED ORDER — SODIUM CHLORIDE 0.9 % IJ SOLN
INTRAMUSCULAR | Status: AC
  Filled 2015-06-10: qty 10

## 2015-06-10 MED ORDER — PROPOFOL 500 MG/50ML IV EMUL
INTRAVENOUS | Status: DC | PRN
  Administered 2015-06-10: 50 ug/kg/min via INTRAVENOUS

## 2015-06-10 MED ORDER — ONDANSETRON HCL 4 MG/2ML IJ SOLN
INTRAMUSCULAR | Status: AC
  Filled 2015-06-10: qty 2

## 2015-06-10 MED ORDER — LIDOCAINE HCL (CARDIAC) 20 MG/ML IV SOLN
INTRAVENOUS | Status: DC | PRN
  Administered 2015-06-10: 100 mg via INTRAVENOUS

## 2015-06-10 MED ORDER — ONDANSETRON HCL 4 MG/2ML IJ SOLN: 4.0000 mg | Freq: Once | INTRAMUSCULAR | Status: DC | PRN

## 2015-06-10 MED ORDER — LIDOCAINE-EPINEPHRINE (PF) 1 %-1:200000 IJ SOLN
INTRAMUSCULAR | Status: AC
  Filled 2015-06-10: qty 30

## 2015-06-10 MED ORDER — HEPARIN SODIUM (PORCINE) 1000 UNIT/ML IJ SOLN
INTRAMUSCULAR | Status: DC | PRN
  Administered 2015-06-10: 8000 [IU] via INTRAVENOUS

## 2015-06-10 MED ORDER — FENTANYL CITRATE (PF) 100 MCG/2ML IJ SOLN: 25.0000 ug | INTRAMUSCULAR | Status: DC | PRN

## 2015-06-10 MED ORDER — ONDANSETRON HCL 4 MG/2ML IJ SOLN
INTRAMUSCULAR | Status: DC | PRN
  Administered 2015-06-10: 4 mg via INTRAVENOUS

## 2015-06-10 MED ORDER — LIDOCAINE HCL (PF) 1 % IJ SOLN
INTRAMUSCULAR | Status: DC | PRN
  Administered 2015-06-10: 30 mL

## 2015-06-10 MED ORDER — GLYCOPYRROLATE 0.2 MG/ML IJ SOLN
INTRAMUSCULAR | Status: AC
  Filled 2015-06-10: qty 1

## 2015-06-10 MED ORDER — EPHEDRINE SULFATE 50 MG/ML IJ SOLN
INTRAMUSCULAR | Status: AC
  Filled 2015-06-10: qty 1

## 2015-06-10 SURGICAL SUPPLY — 34 items
ARMBAND PINK RESTRICT EXTREMIT (MISCELLANEOUS) ×3 IMPLANT
CANISTER SUCTION 2500CC (MISCELLANEOUS) ×3 IMPLANT
CANNULA VESSEL 3MM 2 BLNT TIP (CANNULA) ×3 IMPLANT
CLIP TI MEDIUM 6 (CLIP) ×3 IMPLANT
CLIP TI WIDE RED SMALL 6 (CLIP) ×9 IMPLANT
DECANTER SPIKE VIAL GLASS SM (MISCELLANEOUS) ×3 IMPLANT
ELECT REM PT RETURN 9FT ADLT (ELECTROSURGICAL) ×3
ELECTRODE REM PT RTRN 9FT ADLT (ELECTROSURGICAL) ×1 IMPLANT
GLOVE BIO SURGEON STRL SZ 6.5 (GLOVE) ×1 IMPLANT
GLOVE BIO SURGEON STRL SZ7.5 (GLOVE) ×3 IMPLANT
GLOVE BIO SURGEONS STRL SZ 6.5 (GLOVE) ×1
GLOVE BIOGEL PI IND STRL 6.5 (GLOVE) IMPLANT
GLOVE BIOGEL PI IND STRL 7.0 (GLOVE) IMPLANT
GLOVE BIOGEL PI IND STRL 8 (GLOVE) ×1 IMPLANT
GLOVE BIOGEL PI INDICATOR 6.5 (GLOVE) ×4
GLOVE BIOGEL PI INDICATOR 7.0 (GLOVE) ×2
GLOVE BIOGEL PI INDICATOR 8 (GLOVE) ×2
GLOVE SURG SS PI 6.5 STRL IVOR (GLOVE) ×3 IMPLANT
GOWN STRL REUS W/ TWL LRG LVL3 (GOWN DISPOSABLE) ×3 IMPLANT
GOWN STRL REUS W/TWL LRG LVL3 (GOWN DISPOSABLE) ×9
KIT BASIN OR (CUSTOM PROCEDURE TRAY) ×3 IMPLANT
KIT ROOM TURNOVER OR (KITS) ×3 IMPLANT
LIQUID BAND (GAUZE/BANDAGES/DRESSINGS) ×3 IMPLANT
NS IRRIG 1000ML POUR BTL (IV SOLUTION) ×3 IMPLANT
PACK CV ACCESS (CUSTOM PROCEDURE TRAY) ×3 IMPLANT
PAD ARMBOARD 7.5X6 YLW CONV (MISCELLANEOUS) ×6 IMPLANT
SUT PROLENE 6 0 BV (SUTURE) ×3 IMPLANT
SUT SILK 3 0 (SUTURE) ×3
SUT SILK 3-0 18XBRD TIE 12 (SUTURE) IMPLANT
SUT VIC AB 3-0 SH 27 (SUTURE) ×6
SUT VIC AB 3-0 SH 27X BRD (SUTURE) ×1 IMPLANT
SUT VICRYL 4-0 PS2 18IN ABS (SUTURE) ×5 IMPLANT
UNDERPAD 30X30 INCONTINENT (UNDERPADS AND DIAPERS) ×3 IMPLANT
WATER STERILE IRR 1000ML POUR (IV SOLUTION) ×3 IMPLANT

## 2015-06-10 NOTE — Op Note (Signed)
    NAME: James Brown  MRN: [id number] DOB: August 29, 1965    DATE OF OPERATION: 06/10/2015  PREOP DIAGNOSIS: Stage IV chronic kidney disease  POSTOP DIAGNOSIS: Same  PROCEDURE: Left brachiocephalic AV fistula  SURGEON: Judeth Cornfield. Scot Dock, MD, FACS  ASSIST: Silva Bandy, [facility]  ANESTHESIA: local with sedation   EBL: minimal  INDICATIONS: [name] is a 49 y.o. male who presents for new access. He currently dialyzes via a right IJ tunneled dialysis catheter. He had a right brachiocephalic fistula for many years but ultimately this failed and could no longer be salvaged.  FINDINGS: 4 mm cephalic vein. The vein was lateral and therefore I had to dissect out a longer length in order to reach the brachial artery.  TECHNIQUE: The patient was taken to the operating room and sedated by anesthesia. The left upper extremity was prepped and draped in usual sterile fashion. The upper arm cephalic vein appeared to be adequate by duplex. However, it was fairly far laterally in the arm. After the skin was anesthetized with functional lidocaine, using 2 longitudinal incisions one above the antecubital space and one below the antecubital space, the cephalic vein was dissected free with small branches divided between clips and 3-0 silk ties. After the skin was anesthetized, a separate longitudinal incision was made over the brachial artery. The brachial artery was controlled with a vessel loop. The vein was ligated distally and irrigated up with heparinized saline. A tunnel was then created for anastomosis to the brachial artery. The patient was heparinized. The brachial artery was clamped proximally and distally and longitudinal arteriotomy was made. The vein was sewn end-to-side to the artery using continuous 6-0 Prolene suture. At the completion was a good thrill in the fistula. There was a palpable radial pulse. The heparin was partially reversed with protamine. The wounds were closed with  a deep layer of 3-0 Vicryl. The skin was closed with 4-0 Vicryl. Liquid band was applied. The patient tolerated the procedure well and was transferred to the recovery room in stable condition. All needle and sponge counts were correct.  Deitra Mayo, MD, FACS [facility]  DATE OF DICTATION:   06/10/2015

## 2015-06-10 NOTE — Anesthesia Postprocedure Evaluation (Signed)
Anesthesia Post Note  Patient: James Brown  Procedure(s) Performed: Procedure(s) (LRB): ARTERIOVENOUS (AV) FISTULA CREATION LEFT UPPER ARM (Left)  Patient location during evaluation: PACU Anesthesia Type: MAC Level of consciousness: awake and alert Pain management: pain level controlled Vital Signs Assessment: post-procedure vital signs reviewed and stable Respiratory status: spontaneous breathing, nonlabored ventilation, respiratory function stable and patient connected to nasal cannula oxygen Cardiovascular status: stable and blood pressure returned to baseline Anesthetic complications: no    Last Vitals:  Filed Vitals:   06/10/15 1003 06/10/15 1016  BP: 127/87 136/78  Pulse: 85 85  Temp:  36.4 C  Resp: 13 11    Last Pain: There were no vitals filed for this visit.               Catalina Gravel

## 2015-06-10 NOTE — Discharge Instructions (Signed)
° ° °  06/10/2015 James Brown [id number] 03/05/1966  Surgeon(s): Angelia Mould, MD  Procedure(s): ARTERIOVENOUS (AV) FISTULA CREATION LEFT UPPER ARM  x Do not stick graft for 12 weeks

## 2015-06-10 NOTE — Interval H&P Note (Signed)
History and Physical Interval Note:  06/10/2015 6:54 AM  James Brown  has presented today for surgery, with the diagnosis of End Stage Renal Disease N18.6  The various methods of treatment have been discussed with the patient and family. After consideration of risks, benefits and other options for treatment, the patient has consented to  Procedure(s): ARTERIOVENOUS (AV) FISTULA CREATION (Left) as a surgical intervention .  The patient's history has been reviewed, patient examined, no change in status, stable for surgery.  I have reviewed the patient's chart and labs.  Questions were answered to the patient's satisfaction.     Deitra Mayo

## 2015-06-10 NOTE — Anesthesia Preprocedure Evaluation (Addendum)
Anesthesia Evaluation  Patient identified by MRN, date of birth, ID band Patient awake    Reviewed: Allergy & Precautions, H&P , NPO status , Patient's Chart, lab work & pertinent test results  History of Anesthesia Complications Negative for: history of anesthetic complications  Airway Mallampati: II  TM Distance: >3 FB Neck ROM: Full    Dental  (+) Teeth Intact, Dental Advisory Given   Pulmonary sleep apnea ,    Pulmonary exam normal breath sounds clear to auscultation       Cardiovascular hypertension, (-) angina+ Peripheral Vascular Disease (AAA)  (-) CAD and (-) Past MI Normal cardiovascular exam Rhythm:Regular Rate:Normal     Neuro/Psych negative neurological ROS  negative psych ROS   GI/Hepatic negative GI ROS, Neg liver ROS,   Endo/Other  diabetesObesity   Renal/GU CRF and DialysisRenal disease (K+ 4.6)     Musculoskeletal negative musculoskeletal ROS (+)   Abdominal   Peds  Hematology negative hematology ROS (+)   Anesthesia Other Findings Last dialysis 12/15  Reproductive/Obstetrics                           Anesthesia Physical Anesthesia Plan  ASA: IV  Anesthesia Plan: General   Post-op Pain Management:    Induction: Intravenous  Airway Management Planned: LMA  Additional Equipment:   Intra-op Plan:   Post-operative Plan: Extubation in OR  Informed Consent: I have reviewed the patients History and Physical, chart, labs and discussed the procedure including the risks, benefits and alternatives for the proposed anesthesia with the patient or authorized representative who has indicated his/her understanding and acceptance.   Dental advisory given  Plan Discussed with: CRNA and Anesthesiologist  Anesthesia Plan Comments: (GA vs MAC. Will confirm with surgeon.)       Anesthesia Quick Evaluation

## 2015-06-10 NOTE — Transfer of Care (Signed)
Immediate Anesthesia Transfer of Care Note  Patient: James Brown  Procedure(s) Performed: Procedure(s): ARTERIOVENOUS (AV) FISTULA CREATION LEFT UPPER ARM (Left)  Patient Location: PACU  Anesthesia Type:MAC  Level of Consciousness: awake, alert , oriented and sedated  Airway & Oxygen Therapy: Patient Spontanous Breathing  Post-op Assessment: Report given to RN, Post -op Vital signs reviewed and stable and Patient moving all extremities  Post vital signs: Reviewed and stable  Last Vitals:  Filed Vitals:   06/10/15 0615  BP: 101/72  Pulse: 69  Temp: 36.8 C  Resp: 16    Complications: No apparent anesthesia complications

## 2015-06-10 NOTE — H&P (View-Only) (Signed)
Vascular and Vein Specialist of [facility]  Patient name: James Brown MRN: [id number] DOB: April 08, 1966 Sex: male  REASON FOR CONSULT: Evaluate for hemodialysis access. Referred by Dr. Otelia Santee  HPI: James Brown is a 49 y.o. male, who is currently being dialyzed with a right IJ tunneled dialysis catheter that was placed approximately 3 weeks ago. He has had a right brachiocephalic fistula for many years but ultimately this failed and no longer be salvaged. He presents for evaluation for new access. He denies any recent uremic symptoms. Specifically he denies nausea, vomiting, fatigue, anorexia, or palpitations.  In reviewing the records, the patient did have a revision of a right brachiocephalic fistula with insertion of an AV graft using an 8 mm graft by Dr. Kellie Simmering in March 2015. 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  Past Medical History  Diagnosis Date  . Hypertension   . Hyperlipidemia   . Dermatitis   . Tinea versicolor   . AAA (abdominal aortic aneurysm) ([place])   . ESRD (end stage renal disease) on dialysis ([place])     started 09/2006 MWF [facility]  . Diabetes ([place])     No family history on file.  SOCIAL HISTORY: Social History   Social History  . Marital Status: Married    Spouse Name: N/A  . Number of Children: N/A  . Years of Education: N/A   Occupational History  . Not on file.   Social History Main Topics  . Smoking status: Never Smoker   . Smokeless tobacco: Never Used  . Alcohol Use: No  . Drug Use: No  . Sexual Activity: Not on file   Other Topics Concern  . Not on file   Social History Narrative    No Known Allergies  Current Outpatient Prescriptions  Medication Sig Dispense Refill  . calcium carbonate (TUMS - DOSED IN MG ELEMENTAL CALCIUM) 500 MG chewable tablet Chew 3 tablets by mouth 3 (three) times daily with meals.    . multivitamin (RENA-VIT) TABS tablet Take 1 tablet by mouth  daily.    . sodium bicarbonate 650 MG tablet Take 650 mg by mouth 2 (two) times daily.    Marland Kitchen oxyCODONE-acetaminophen (PERCOCET/ROXICET) 5-325 MG per tablet Take 1 tablet by mouth every 4 (four) hours as needed for severe pain. (Patient not taking: Reported on 06/03/2015) 30 tablet 0  . simvastatin (ZOCOR) 80 MG tablet Take 1 tablet by mouth daily.     No current facility-administered medications for this visit.    REVIEW OF SYSTEMS:  [X]  denotes positive finding, [ ]  denotes negative finding Cardiac  Comments:  Chest pain or chest pressure:    Shortness of breath upon exertion:    Short of breath when lying flat:    Irregular heart rhythm:        Vascular    Pain in calf, thigh, or hip brought on by ambulation:    Pain in feet at night that wakes you up from your sleep:     Blood clot in your veins:    Leg swelling:         Pulmonary    Oxygen at home:    Productive cough:     Wheezing:         Neurologic    Sudden weakness in arms or legs:     Sudden numbness in arms or legs:     Sudden onset of difficulty speaking or slurred speech:    Temporary loss  of vision in one eye:     Problems with dizziness:         Gastrointestinal    Blood in stool:     Vomited blood:         Genitourinary    Burning when urinating:     Blood in urine:        Psychiatric    Major depression:         Hematologic    Bleeding problems:    Problems with blood clotting too easily:        Skin    Rashes or ulcers:        Constitutional    Fever or chills:      PHYSICAL EXAM: Filed Vitals:   06/03/15 1110  BP: 98/67  Pulse: 107  Temp: 98.1 F (36.7 C)  Resp: 16  Height: 5' 5.5" (1.664 m)  Weight: 221 lb (100.245 kg)  SpO2: 96%    GENERAL: The patient is a well-nourished male, in no acute distress. The vital signs are documented above. CARDIAC: There is a regular rate and rhythm.  VASCULAR: I do not detect carotid bruits. He has palpable radial pulses. PULMONARY: There is  good air exchange bilaterally without wheezing or rales. ABDOMEN: Soft and non-tender with normal pitched bowel sounds.  MUSCULOSKELETAL: There are no major deformities or cyanosis. NEUROLOGIC: No focal weakness or paresthesias are detected. SKIN: There are no ulcers or rashes noted. PSYCHIATRIC: The patient has a normal affect.  DATA:   UPPER EXTREMITY ARTERIAL DUPLEX: I have independently interpreted the upper extremity arterial duplex scan which shows triphasic Doppler signals in the radial and ulnar positions bilaterally.  UPPER EXTREMITY VEIN MAPPING: I have independently interpreted the upper extremity vein mapping. On the left side, the forearm cephalic vein looks marginal in size. The upper arm cephalic vein looks to be reasonable size as does the basilic vein.  MEDICAL ISSUES:  END-STAGE RENAL DISEASE: I think the next most logical place for access would be a left brachiocephalic AV fistula. He appears to have a reasonable upper arm cephalic vein on the left. The forearm cephalic vein is smaller and the radial artery is slightly thickened. This reason I recommended a brachiocephalic fistula. This has been scheduled for 06/10/2015. I have explained the indications for placement of an AV fistula or AV graft. I've explained that if at all possible we will place an AV fistula.  I have reviewed the risks of placement of an AV fistula including but not limited to: failure of the fistula to mature, need for subsequent interventions, and thrombosis. In addition I have reviewed the potential complications of placement of an AV graft. These risks include, but are not limited to, graft thrombosis, graft infection, wound healing problems, bleeding, arm swelling, and steal syndrome. All the patient's questions were answered and they are agreeable to proceed with surgery.   Deitra Mayo [facility] of [place]: [phone]

## 2015-06-13 ENCOUNTER — Encounter (HOSPITAL_COMMUNITY): Payer: Self-pay | Admitting: Vascular Surgery

## 2015-06-23 ENCOUNTER — Emergency Department (HOSPITAL_COMMUNITY)
Admission: EM | Admit: 2015-06-23 | Discharge: 2015-06-23 | Disposition: A | Discharge status: Home / Self Care | Payer: Medicare Other | Attending: Emergency Medicine | Admitting: Emergency Medicine

## 2015-06-23 ENCOUNTER — Emergency Department (HOSPITAL_COMMUNITY): Payer: Medicare Other

## 2015-06-23 ENCOUNTER — Encounter (HOSPITAL_COMMUNITY): Payer: Self-pay | Admitting: *Deleted

## 2015-06-23 DIAGNOSIS — I120 Hypertensive chronic kidney disease with stage 5 chronic kidney disease or end stage renal disease: Secondary | ICD-10-CM | POA: Insufficient documentation

## 2015-06-23 DIAGNOSIS — Z992 Dependence on renal dialysis: Secondary | ICD-10-CM | POA: Insufficient documentation

## 2015-06-23 DIAGNOSIS — Z8619 Personal history of other infectious and parasitic diseases: Secondary | ICD-10-CM | POA: Insufficient documentation

## 2015-06-23 DIAGNOSIS — N186 End stage renal disease: Secondary | ICD-10-CM | POA: Diagnosis not present

## 2015-06-23 DIAGNOSIS — Z872 Personal history of diseases of the skin and subcutaneous tissue: Secondary | ICD-10-CM | POA: Diagnosis not present

## 2015-06-23 DIAGNOSIS — Z8719 Personal history of other diseases of the digestive system: Secondary | ICD-10-CM | POA: Insufficient documentation

## 2015-06-23 DIAGNOSIS — R05 Cough: Secondary | ICD-10-CM | POA: Insufficient documentation

## 2015-06-23 DIAGNOSIS — Z79899 Other long term (current) drug therapy: Secondary | ICD-10-CM | POA: Diagnosis not present

## 2015-06-23 DIAGNOSIS — R059 Cough, unspecified: Secondary | ICD-10-CM

## 2015-06-23 LAB — CBC WITH DIFFERENTIAL/PLATELET
Basophils Absolute: 0.1 10*3/uL (ref 0.0–0.1)
Basophils Relative: 1 %
Eosinophils Absolute: 0.2 10*3/uL (ref 0.0–0.7)
Eosinophils Relative: 2 %
HEMATOCRIT: 34.6 % — AB (ref 39.0–52.0)
HEMOGLOBIN: 11 g/dL — AB (ref 13.0–17.0)
LYMPHS ABS: 1.8 10*3/uL (ref 0.7–4.0)
LYMPHS PCT: 16 %
MCH: 25.5 pg — AB (ref 26.0–34.0)
MCHC: 31.8 g/dL (ref 30.0–36.0)
MCV: 80.3 fL (ref 78.0–100.0)
MONO ABS: 0.7 10*3/uL (ref 0.1–1.0)
MONOS PCT: 6 %
NEUTROS ABS: 8.6 10*3/uL — AB (ref 1.7–7.7)
NEUTROS PCT: 75 %
Platelets: 377 10*3/uL (ref 150–400)
RBC: 4.31 MIL/uL (ref 4.22–5.81)
RDW: 14.2 % (ref 11.5–15.5)
WBC: 11.4 10*3/uL — ABNORMAL HIGH (ref 4.0–10.5)

## 2015-06-23 LAB — BASIC METABOLIC PANEL
Anion gap: 17 — ABNORMAL HIGH (ref 5–15)
BUN: 33 mg/dL — ABNORMAL HIGH (ref 6–20)
CALCIUM: 10.4 mg/dL — AB (ref 8.9–10.3)
CHLORIDE: 96 mmol/L — AB (ref 101–111)
CO2: 24 mmol/L (ref 22–32)
CREATININE: 8.28 mg/dL — AB (ref 0.61–1.24)
GFR calc Af Amer: 8 mL/min — ABNORMAL LOW (ref >60)
GFR calc non Af Amer: 7 mL/min — ABNORMAL LOW (ref >60)
GLUCOSE: 136 mg/dL — AB (ref 65–99)
Potassium: 3.9 mmol/L (ref 3.5–5.1)
Sodium: 137 mmol/L (ref 135–145)

## 2015-06-23 LAB — I-STAT CG4 LACTIC ACID, ED: Lactic Acid, Venous: 1.52 mmol/L (ref 0.5–2.0)

## 2015-06-23 MED ORDER — HYDROCOD POLST-CPM POLST ER 10-8 MG/5ML PO SUER: 5.0000 mL | Freq: Every evening | ORAL | Status: DC | PRN

## 2015-06-23 NOTE — ED Notes (Signed)
Pt is dialysis pt, last treatment was today. Reports having nonproductive cough x 3 week. Denies fever. Difficulty sleeping at night due to cough. Airway intact at triage.

## 2015-06-23 NOTE — Discharge Instructions (Signed)

## 2015-06-23 NOTE — ED Provider Notes (Signed)
CSN: [id number]     Arrival date & time 06/23/15  1235 History   First MD Initiated Contact with Patient 06/23/15 1500     Chief Complaint  Patient presents with  . Cough     (Consider location/radiation/quality/duration/timing/severity/associated sxs/prior Treatment) HPI 49 year old man presents today stating that he has had a nonproductive cough for 3 weeks. He is a dialysis patient and goes on Tuesday Thursday Saturday. He states he does have some more coughing during the night. He denies any associated symptoms of rhinorrhea, sore throat, productive cough, fever, or chills. He has not dyspneic. He denies being more short of breath with laying down or ambulating. He denies any history of lateralized swelling in his legs or risk factor factors for DVT or history of pulmonary embolism. He did have flu shot this year. He has no known sick contacts but does go to dialysis. He states his dry weight have been stable. He denies any chest pain, abdominal pain, nausea, vomiting, or diarrhea. Past Medical History  Diagnosis Date  . Hypertension   . Hyperlipidemia   . Dermatitis   . Tinea versicolor   . AAA (abdominal aortic aneurysm) ([place])   . Diabetes ([place])     boderline  . ESRD (end stage renal disease) on dialysis [facility])     started 09/2006 MWF [facility],   [facility]  . Constipation    Past Surgical History  Procedure Laterality Date  . Av fistula placement  06/2005    Right Arm  . Parathyroidectomy    . Parathyroidectomy Left 10/21/2012    Procedure: TOTAL PARATHYROIDECTOMY WITH AUTOTRANSPLANT TO THE LEFT FOREARM;  Surgeon: Earnstine Regal, MD;  Location: [place];  Service: General;  Laterality: Left;  . Av fistula placement Right 09/04/2013    Procedure: REVISION OF RIGHT BRACHIOCEPHALIC FISTULA WITH INSERTION OF ARTERIOVENOUS (AV) GORE-TEX GRAFT ARM;  Surgeon: Mal Misty, MD;  Location: [place];  Service: Vascular;  Laterality: Right;  . Av fistula placement Left 06/10/2015    Procedure:  ARTERIOVENOUS (AV) FISTULA CREATION LEFT UPPER ARM;  Surgeon: Angelia Mould, MD;  Location: [facility] OR;  Service: Vascular;  Laterality: Left;   Family History  Problem Relation Age of Onset  . Adopted: Yes   Social History  Substance Use Topics  . Smoking status: Never Smoker   . Smokeless tobacco: Never Used  . Alcohol Use: No    Review of Systems  All other systems reviewed and are negative.     Allergies  Review of patient's allergies indicates no known allergies.  Home Medications   Prior to Admission medications   Medication Sig Start Date End Date Taking? Authorizing Provider  calcium carbonate (TUMS - DOSED IN MG ELEMENTAL CALCIUM) 500 MG chewable tablet Chew 3 tablets by mouth 3 (three) times daily with meals. Patient reported taking 3 1000 mg.   Yes Historical Provider, MD  multivitamin (RENA-VIT) TABS tablet Take 1 tablet by mouth daily.   Yes Historical Provider, MD  sodium bicarbonate 650 MG tablet Take 650 mg by mouth 2 (two) times daily.   Yes Historical Provider, MD  oxyCODONE-acetaminophen (PERCOCET/ROXICET) 5-325 MG tablet Take 1-2 tablets by mouth every 4 (four) hours as needed for severe pain. 06/10/15   Joelene Millin A Trinh, PA-C   BP 90/56 mmHg  Pulse 108  Temp(Src) 98.3 F (36.8 C) (Oral)  Resp 19  SpO2 97% Physical Exam  Constitutional: He is oriented to person, place, and time. He appears well-developed and well-nourished.  HENT:  Head: Normocephalic and atraumatic.  Right Ear: External ear normal.  Left Ear: External ear normal.  Nose: Nose normal.  Mouth/Throat: Oropharynx is clear and moist.  Eyes: Conjunctivae and EOM are normal. Pupils are equal, round, and reactive to light.  Neck: Normal range of motion. Neck supple.  Cardiovascular: Normal rate, regular rhythm, normal heart sounds and intact distal pulses.   Pulmonary/Chest: Effort normal and breath sounds normal. No respiratory distress. He has no wheezes. He exhibits no tenderness.   Abdominal: Soft. Bowel sounds are normal. He exhibits no distension and no mass. There is no tenderness. There is no guarding.  Musculoskeletal: Normal range of motion.  Left upper extremity with dialysis graft in place  Neurological: He is alert and oriented to person, place, and time. He has normal reflexes. He exhibits normal muscle tone. Coordination normal.  Skin: Skin is warm and dry.  Psychiatric: He has a normal mood and affect. His behavior is normal. Judgment and thought content normal.  Nursing note and vitals reviewed.   ED Course  Procedures (including critical care time) Labs Review Labs Reviewed  CBC WITH DIFFERENTIAL/PLATELET - Abnormal; Notable for the following:    WBC 11.4 (*)    Hemoglobin 11.0 (*)    HCT 34.6 (*)    MCH 25.5 (*)    Neutro Abs 8.6 (*)    All other components within normal limits  BASIC METABOLIC PANEL - Abnormal; Notable for the following:    Chloride 96 (*)    Glucose, Bld 136 (*)    BUN 33 (*)    Creatinine, Ser 8.28 (*)    Calcium 10.4 (*)    GFR calc non Af Amer 7 (*)    GFR calc Af Amer 8 (*)    Anion gap 17 (*)    All other components within normal limits  I-STAT CG4 LACTIC ACID, ED    Imaging Review Dg Chest 2 View  06/23/2015  CLINICAL DATA:  Cough 3 weeks.  End-stage renal disease. EXAM: CHEST  2 VIEW COMPARISON:  01/01/2013 FINDINGS: Right IJ dialysis catheter with tip over the SVC. Lungs are adequately inflated without consolidation or effusion. Cardiomediastinal silhouette is within normal. There is mild calcified plaque over the aortic arch. There are moderate degenerative changes of the spine. Metallic stent is present over the right axillary region. IMPRESSION: No active cardiopulmonary disease. Electronically Signed   By: Marin Olp M.D.   On: 06/23/2015 14:04   I have personally reviewed and evaluated these images and lab results as part of my medical decision-making.   MDM   Final diagnoses:  Cough     49 year old dialysis patient with cough and no acute cardiopulmonary abnormalities noted on chest x-Lejuan Botto. Labs are consistent with his renal failure with creatinine at 8.28. Hemoglobin is 11 and the patient has mild leukocytosis at 11,400. Chest x-Nereyda Bowler shows no evidence of acute infiltrate. Plan a time dose of Tussionex for coughing. Discussed return precautions and need for follow-up and he voices understanding.   Pattricia Boss, MD 06/25/15 [phone]

## 2015-07-18 ENCOUNTER — Encounter: Payer: Self-pay | Admitting: Vascular Surgery

## 2015-07-27 ENCOUNTER — Encounter: Payer: Self-pay | Admitting: Vascular Surgery

## 2015-07-27 ENCOUNTER — Ambulatory Visit (INDEPENDENT_AMBULATORY_CARE_PROVIDER_SITE_OTHER): Payer: Medicare Other | Admitting: Vascular Surgery

## 2015-07-27 ENCOUNTER — Ambulatory Visit (HOSPITAL_COMMUNITY)
Admission: RE | Admit: 2015-07-27 | Discharge: 2015-07-27 | Disposition: A | Discharge status: Home / Self Care | Payer: Medicare Other | Source: Ambulatory Visit | Attending: Vascular Surgery | Admitting: Vascular Surgery

## 2015-07-27 ENCOUNTER — Encounter: Payer: Medicare Other | Admitting: Vascular Surgery

## 2015-07-27 VITALS — BP 166/91 | HR 97 | Temp 97.5°F | Resp 18 | Ht 67.5 in | Wt 222.0 lb

## 2015-07-27 DIAGNOSIS — I120 Hypertensive chronic kidney disease with stage 5 chronic kidney disease or end stage renal disease: Secondary | ICD-10-CM | POA: Insufficient documentation

## 2015-07-27 DIAGNOSIS — N186 End stage renal disease: Secondary | ICD-10-CM

## 2015-07-27 DIAGNOSIS — R7303 Prediabetes: Secondary | ICD-10-CM | POA: Diagnosis not present

## 2015-07-27 DIAGNOSIS — Z4931 Encounter for adequacy testing for hemodialysis: Secondary | ICD-10-CM | POA: Diagnosis present

## 2015-07-27 DIAGNOSIS — Z992 Dependence on renal dialysis: Secondary | ICD-10-CM | POA: Diagnosis not present

## 2015-07-27 DIAGNOSIS — N184 Chronic kidney disease, stage 4 (severe): Secondary | ICD-10-CM

## 2015-07-27 DIAGNOSIS — E785 Hyperlipidemia, unspecified: Secondary | ICD-10-CM | POA: Insufficient documentation

## 2015-07-27 NOTE — Progress Notes (Signed)
Filed Vitals:   07/27/15 1433 07/27/15 1439  BP: 143/100 166/91  Pulse: 97 97  Temp: 97.5 F (36.4 C)   TempSrc: Oral   Resp: 18   Height: 5' 7.5" (1.715 m)   Weight: 222 lb (100.699 kg)   SpO2: 98%

## 2015-07-27 NOTE — Progress Notes (Signed)
   Patient name: James Brown MRN: [id number] DOB: 1966/06/01 Sex: male  REASON FOR VISIT: follow up after left brachiocephalic AV fistula  HPI: James Brown is a 50 y.o. male had a left brachiocephalic AV fistula placed on 06/10/2015. He comes in for routine follow up visit. He dialyzes at [place].  Current Outpatient Prescriptions  Medication Sig Dispense Refill  . calcium carbonate (TUMS - DOSED IN MG ELEMENTAL CALCIUM) 500 MG chewable tablet Chew 3 tablets by mouth 3 (three) times daily with meals. Patient reported taking 3 1000 mg.    . chlorpheniramine-HYDROcodone (TUSSIONEX PENNKINETIC ER) 10-8 MG/5ML SUER Take 5 mLs by mouth at bedtime as needed for cough. 140 mL 0  . multivitamin (RENA-VIT) TABS tablet Take 1 tablet by mouth daily.    Marland Kitchen oxyCODONE-acetaminophen (PERCOCET/ROXICET) 5-325 MG tablet Take 1-2 tablets by mouth every 4 (four) hours as needed for severe pain. 20 tablet 0  . sodium bicarbonate 650 MG tablet Take 650 mg by mouth 2 (two) times daily.     No current facility-administered medications for this visit.    REVIEW OF SYSTEMS:  [X]  denotes positive finding, [ ]  denotes negative finding Cardiac  Comments:  Chest pain or chest pressure:    Shortness of breath upon exertion:    Short of breath when lying flat:    Irregular heart rhythm:    Constitutional    Fever or chills:      PHYSICAL EXAM: Filed Vitals:   07/27/15 1433 07/27/15 1439  BP: 143/100 166/91  Pulse: 97 97  Temp: 97.5 F (36.4 C)   TempSrc: Oral   Resp: 18   Height: 5' 7.5" (1.715 m)   Weight: 222 lb (100.699 kg)   SpO2: 98%     GENERAL: The patient is a well-nourished male, in no acute distress. The vital signs are documented above. CARDIOVASCULAR: There is a regular rate and rhythm. PULMONARY: There is good air exchange bilaterally without wheezing or rales. His upper arm fistula has an excellent thrill and bruit. It appears to be maturing adequately. He did  have a small stitch at the antecubital incision which I removed.  DUPLEX LEFT BRACHIOCEPHALIC AV FISTULA: I have independently interpreted his duplex of his left brachiocephalic AV fistula. Diameters of the fistula range from 0.67-0.77 cm. Depths ranged from 0.27-0.58 cm.  MEDICAL ISSUES: This fistula appears to be maturing adequately and should be ready for dialysis in mid March. I will see him back as needed.  HYPERTENSION: The patient's initial blood pressure today was elevated. We repeated this and this was still elevated. We have encouraged the patient to follow up with their primary care physician for management of their blood pressure.   Deitra Mayo [facility] of [place]: [phone]

## 2015-11-26 ENCOUNTER — Encounter (HOSPITAL_COMMUNITY): Payer: Self-pay | Admitting: Emergency Medicine

## 2015-11-26 ENCOUNTER — Emergency Department (HOSPITAL_COMMUNITY)
Admission: EM | Admit: 2015-11-26 | Discharge: 2015-11-27 | Disposition: A | Discharge status: Home / Self Care | Payer: No Typology Code available for payment source | Attending: Emergency Medicine | Admitting: Emergency Medicine

## 2015-11-26 DIAGNOSIS — Z79899 Other long term (current) drug therapy: Secondary | ICD-10-CM | POA: Insufficient documentation

## 2015-11-26 DIAGNOSIS — N186 End stage renal disease: Secondary | ICD-10-CM | POA: Diagnosis not present

## 2015-11-26 DIAGNOSIS — I120 Hypertensive chronic kidney disease with stage 5 chronic kidney disease or end stage renal disease: Secondary | ICD-10-CM | POA: Diagnosis not present

## 2015-11-26 DIAGNOSIS — S139XXA Sprain of joints and ligaments of unspecified parts of neck, initial encounter: Secondary | ICD-10-CM

## 2015-11-26 DIAGNOSIS — Y939 Activity, unspecified: Secondary | ICD-10-CM | POA: Insufficient documentation

## 2015-11-26 DIAGNOSIS — S199XXA Unspecified injury of neck, initial encounter: Secondary | ICD-10-CM | POA: Diagnosis present

## 2015-11-26 DIAGNOSIS — E785 Hyperlipidemia, unspecified: Secondary | ICD-10-CM | POA: Insufficient documentation

## 2015-11-26 DIAGNOSIS — Y999 Unspecified external cause status: Secondary | ICD-10-CM | POA: Diagnosis not present

## 2015-11-26 DIAGNOSIS — S134XXA Sprain of ligaments of cervical spine, initial encounter: Secondary | ICD-10-CM | POA: Insufficient documentation

## 2015-11-26 DIAGNOSIS — Y92410 Unspecified street and highway as the place of occurrence of the external cause: Secondary | ICD-10-CM | POA: Insufficient documentation

## 2015-11-26 DIAGNOSIS — Z992 Dependence on renal dialysis: Secondary | ICD-10-CM | POA: Insufficient documentation
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
## 2015-11-27 ENCOUNTER — Emergency Department (HOSPITAL_COMMUNITY): Payer: No Typology Code available for payment source

## 2015-11-27 DIAGNOSIS — S134XXA Sprain of ligaments of cervical spine, initial encounter: Secondary | ICD-10-CM | POA: Diagnosis not present

## 2015-11-27 MED ORDER — OXYCODONE-ACETAMINOPHEN 5-325 MG PO TABS
1.0000 | ORAL_TABLET | Freq: Once | ORAL | Status: AC
  Administered 2015-11-27: 1 via ORAL
  Filled 2015-11-27: qty 1
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
## 2015-11-27 NOTE — Discharge Instructions (Signed)
You have neck pain, possibly from a cervical strain and/or pinched nerve.  ° °SEEK IMMEDIATE MEDICAL ATTENTION IF: °You develop difficulties swallowing or breathing.  °You have new or worse numbness, weakness, tingling, or movement problems in your arms or legs.  °You develop increasing pain which is uncontrolled with medications.  °You have change in bowel or bladder function, or other concerns. ° ° ° °

## 2017-10-07 ENCOUNTER — Emergency Department (HOSPITAL_BASED_OUTPATIENT_CLINIC_OR_DEPARTMENT_OTHER)
Admission: EM | Admit: 2017-10-07 | Discharge: 2017-10-07 | Disposition: A | Discharge status: Home / Self Care | Payer: Medicare Other | Attending: Emergency Medicine | Admitting: Emergency Medicine

## 2017-10-07 ENCOUNTER — Encounter (HOSPITAL_BASED_OUTPATIENT_CLINIC_OR_DEPARTMENT_OTHER): Payer: Self-pay | Admitting: *Deleted

## 2017-10-07 ENCOUNTER — Other Ambulatory Visit: Payer: Self-pay

## 2017-10-07 DIAGNOSIS — T7840XA Allergy, unspecified, initial encounter: Secondary | ICD-10-CM | POA: Diagnosis present

## 2017-10-07 DIAGNOSIS — Z79899 Other long term (current) drug therapy: Secondary | ICD-10-CM | POA: Diagnosis not present

## 2017-10-07 DIAGNOSIS — E1122 Type 2 diabetes mellitus with diabetic chronic kidney disease: Secondary | ICD-10-CM | POA: Insufficient documentation

## 2017-10-07 DIAGNOSIS — N186 End stage renal disease: Secondary | ICD-10-CM | POA: Insufficient documentation

## 2017-10-07 DIAGNOSIS — Z992 Dependence on renal dialysis: Secondary | ICD-10-CM | POA: Diagnosis not present

## 2017-10-07 DIAGNOSIS — L509 Urticaria, unspecified: Secondary | ICD-10-CM

## 2017-10-07 DIAGNOSIS — I120 Hypertensive chronic kidney disease with stage 5 chronic kidney disease or end stage renal disease: Secondary | ICD-10-CM | POA: Diagnosis not present

## 2017-10-07 MED ORDER — PREDNISONE 20 MG PO TABS: 20.0000 mg | ORAL_TABLET | Freq: Two times a day (BID) | ORAL | 0 refills | Status: DC

## 2017-10-07 MED ORDER — FAMOTIDINE IN NACL 20-0.9 MG/50ML-% IV SOLN
20.0000 mg | Freq: Once | INTRAVENOUS | Status: AC
  Administered 2017-10-07: 20 mg via INTRAVENOUS
  Filled 2017-10-07: qty 50

## 2017-10-07 MED ORDER — RACEPINEPHRINE HCL 2.25 % IN NEBU: 0.5000 mL | INHALATION_SOLUTION | Freq: Once | RESPIRATORY_TRACT | Status: DC

## 2017-10-07 MED ORDER — CETIRIZINE HCL 10 MG PO TABS: 10.0000 mg | ORAL_TABLET | Freq: Every day | ORAL | 0 refills | Status: DC

## 2017-10-07 MED ORDER — DIPHENHYDRAMINE HCL 50 MG/ML IJ SOLN
25.0000 mg | Freq: Once | INTRAMUSCULAR | Status: AC
  Administered 2017-10-07: 25 mg via INTRAVENOUS
  Filled 2017-10-07: qty 1

## 2017-10-07 MED ORDER — RACEPINEPHRINE HCL 2.25 % IN NEBU
INHALATION_SOLUTION | RESPIRATORY_TRACT | Status: AC
  Administered 2017-10-07: 0.5 mL
  Filled 2017-10-07: qty 0.5

## 2017-10-07 MED ORDER — DEXAMETHASONE SODIUM PHOSPHATE 10 MG/ML IJ SOLN
10.0000 mg | Freq: Once | INTRAMUSCULAR | Status: AC
  Administered 2017-10-07: 10 mg via INTRAVENOUS
  Filled 2017-10-07: qty 1

## 2017-10-07 MED FILL — CETIRIZINE HCL 10 MG TABLET: 10 | 100 days supply | Qty: 100 | Fill #0

## 2017-10-07 MED FILL — predniSONE 20 MG TABS: 20 | 3 days supply | Qty: 6 | Fill #0

## 2017-10-07 NOTE — ED Triage Notes (Signed)
Pt to room 13 by ems, reporting presenting to dialysis this am, approx 5 minutes after starting dialysis pt started having hives and itching all over, swelling and redness to both hands, and swelling and tingling to his lips and tongue. Dialysis gave po benadryl and sent pt pov to urgent care. Urgent care gave kenalog injection and po zantac, called 911 and sent pt here with ems. While en route with ems all symptoms resoved, vss en route. Pt is a/a/o x 4, denies any itching, swelling or other c/o at this time.

## 2017-10-07 NOTE — Discharge Instructions (Addendum)
Prednisone as prescribed for next 3 days. Zyrtec daily. Call dialysis unit to arrange follow-up dialysis tomorrow. Return here with any new or worsening symptoms.

## 2017-10-07 NOTE — ED Notes (Signed)
ED Provider at bedside. 

## 2017-10-07 NOTE — ED Provider Notes (Signed)
[facility] EMERGENCY DEPARTMENT Provider Note   CSN: [id number] Arrival date & time: 10/07/17  2725     History   Chief Complaint Chief Complaint  Patient presents with  . Allergic Reaction    HPI James Brown is a 52 y.o. male.  Complaint is allergic reaction.  HPI: 52 year old male.  History of end-stage renal disease.  He has been on dialysis for greater than 1 year.  He was getting his dialysis at [facility] today.  He started feeling tightness in his throat and shot his arms.  He was noted to have urticaria.  He was sent via private conveyance to urgent care.  Then referred here.  He presents here by ambulance.  No new known antigens today.  Was not bitten or stung.  No new medications.  He had oatmeal prior to his dialysis.  His symptoms started a few minutes after his heparin bolus and into his dialysis run.  For symptom was some abdominal discomfort he went to the bathroom thinking he may have diarrhea.  He then returned after having a bowel movement.  Then urticaria's and neck symptoms started.  Past Medical History:  Diagnosis Date  . AAA (abdominal aortic aneurysm) ([place])   . Constipation   . Dermatitis   . Diabetes ([place])    boderline  . ESRD (end stage renal disease) on dialysis [facility])    started 09/2006 MWF [facility],   [facility]  . Hyperlipidemia   . Hypertension   . Tinea versicolor     Patient Active Problem List   Diagnosis Date Noted  . Mechanical complication of other vascular device, implant, and graft-Right arm  10/05/2013  . Redness-Right arm  10/05/2013  . 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 Type II or unspecified type diabetes mellitus with ketoacidosis, not stated as uncontrolled 10/21/2012  . Hyperparathyroidism, secondary ([place]) 10/09/2012  . Other complications due to renal dialysis device, implant, and graft 07/23/2011  . ESRD 04/28/2009  . HYPERTENSION 01/30/2009  . HYPERLIPIDEMIA 01/25/2009  . OBSTRUCTIVE SLEEP APNEA  01/25/2009    Past Surgical History:  Procedure Laterality Date  . AV FISTULA PLACEMENT  06/2005   Right Arm  . AV FISTULA PLACEMENT Right 09/04/2013   Procedure: REVISION OF RIGHT BRACHIOCEPHALIC FISTULA WITH INSERTION OF ARTERIOVENOUS (AV) GORE-TEX GRAFT ARM;  Surgeon: Mal Misty, MD;  Location: [place];  Service: Vascular;  Laterality: Right;  . AV FISTULA PLACEMENT Left 06/10/2015   Procedure: ARTERIOVENOUS (AV) FISTULA CREATION LEFT UPPER ARM;  Surgeon: Angelia Mould, MD;  Location: [place];  Service: Vascular;  Laterality: Left;  . PARATHYROIDECTOMY    . PARATHYROIDECTOMY Left 10/21/2012   Procedure: TOTAL PARATHYROIDECTOMY WITH AUTOTRANSPLANT TO THE LEFT FOREARM;  Surgeon: Earnstine Regal, MD;  Location: [place];  Service: General;  Laterality: Left;        Home Medications    Prior to Admission medications   Medication Sig Start Date End Date Taking? Authorizing Provider  calcium carbonate (TUMS) 500 MG chewable tablet Chew 1 tablet by mouth 3 (three) times daily as needed for indigestion.     [provider]  cetirizine (ZYRTEC) 10 MG tablet Take 1 tablet (10 mg total) by mouth daily. 1 po q day prn allergies 10/07/17   Tanna Furry, MD  chlorpheniramine-HYDROcodone [facility]) 10-8 MG/5ML SUER Take 5 mLs by mouth at bedtime as needed for cough. 06/23/15   Pattricia Boss, MD  Multiple Vitamins-Minerals (MULTIVITAMIN WITH MINERALS) tablet Take 1  tablet by mouth daily.    [provider]  multivitamin (RENA-VIT) TABS tablet Take 1 tablet by mouth daily.    [provider]  oxyCODONE-acetaminophen (PERCOCET/ROXICET) 5-325 MG tablet Take 1-2 tablets by mouth every 4 (four) hours as needed for severe pain. 06/10/15   Alvia Grove, PA-C  predniSONE (DELTASONE) 20 MG tablet Take 1 tablet (20 mg total) by mouth 2 (two) times daily with a meal. 10/07/17   Tanna Furry, MD  simvastatin (ZOCOR) 80 MG tablet Take 80 mg by mouth.    [provider]  vardenafil (LEVITRA) 20 MG tablet Take 20 mg by mouth.    [provider]    Family History Family History  Adopted: Yes    Social History Social History   Tobacco Use  . Smoking status: Never Smoker  . Smokeless tobacco: Never Used  Substance Use Topics  . Alcohol use: No    Alcohol/week: 0.0 oz  . Drug use: No     Allergies   Patient has no known allergies.   Review of Systems Review of Systems  Constitutional: Negative for appetite change, chills, diaphoresis, fatigue and fever.  HENT: Negative for mouth sores, sore throat and trouble swallowing.        Feeling of throat swelling  Eyes: Negative for visual disturbance.  Respiratory: Positive for cough. Negative for chest tightness, shortness of breath and wheezing.   Cardiovascular: Negative for chest pain.  Gastrointestinal: Negative for abdominal distention, abdominal pain, diarrhea, nausea and vomiting.  Endocrine: Negative for polydipsia, polyphagia and polyuria.  Genitourinary: Negative for dysuria, frequency and hematuria.  Musculoskeletal: Negative for gait problem.  Skin: Positive for rash. Negative for color change and pallor.  Neurological: Negative for dizziness, syncope, light-headedness and headaches.  Hematological: Does not bruise/bleed easily.  Psychiatric/Behavioral: Negative for behavioral problems and confusion.     Physical Exam Updated Vital Signs BP 96/70 (BP Location: Right Arm)   Pulse 72   Temp 97.8 F (36.6 C) (Oral)   Resp 16   Ht 5\' 7"  (1.702 m)   Wt 104.8 kg (231 lb)   SpO2 98%   BMI 36.18 kg/m   Physical Exam  Constitutional: He is oriented to person, place, and time. He appears well-developed and well-nourished. No distress.  HENT:  Head: Normocephalic.  Uvular edema.  No deviation.  No tongue swelling.  No angioedema to the anterior lips or tongue  Eyes: Pupils are equal, round, and reactive to light. Conjunctivae are normal. No scleral icterus.   Neck: Normal range of motion. Neck supple. No thyromegaly present.  Cardiovascular: Normal rate and regular rhythm. Exam reveals no gallop and no friction rub.  No murmur heard. Pulmonary/Chest: Effort normal and breath sounds normal. No respiratory distress. He has no wheezes. He has no rales.  No current wheezing.  Abdominal: Soft. Bowel sounds are normal. He exhibits no distension. There is no tenderness. There is no rebound.  Musculoskeletal: Normal range of motion.  Neurological: He is alert and oriented to person, place, and time.  Skin: Skin is warm and dry. No rash noted.  Skin normal.  Patient's describes elevated rash.  This is resolved  Psychiatric: He has a normal mood and affect. His behavior is normal.     ED Treatments / Results  Labs (all labs ordered are listed, but only abnormal results are displayed) Labs Reviewed - No data to display  EKG None  Radiology No results found.  Procedures Procedures (including critical care time)  Medications Ordered in ED Medications  Racepinephrine HCl 2.25 % nebulizer solution 0.5 mL (0.5 mLs Nebulization Not Given 10/07/17 1000)  dexamethasone (DECADRON) injection 10 mg (10 mg Intravenous Given 10/07/17 1000)  famotidine (PEPCID) IVPB 20 mg premix (0 mg Intravenous Stopped 10/07/17 1031)  diphenhydrAMINE (BENADRYL) injection 25 mg (25 mg Intravenous Given 10/07/17 1005)  Racepinephrine HCl 2.25 % nebulizer solution (0.5 mLs  Given 10/07/17 1000)     Initial Impression / Assessment and Plan / ED Course  I have reviewed the triage vital signs and the nursing notes.  Pertinent labs & imaging results that were available during my care of the patient were reviewed by me and considered in my medical decision making (see chart for details).     Nebulized racemic epinephrine, Solu-Medrol 25 IV, 25 Benadryl IV, Pepcid 20 IV.  Observed here for 3 hours.  No recurrence.  Uvular edema has resolved.  I discussed the case with  nephrology, Dr. Charissa Bash.  He called Andree Elk form the patient's dialysis center.  No new treatment protocols today.  Patient will go tomorrow.  We will keep him on prednisone and Zyrtec.  Return with recurrence.  Final Clinical Impressions(s) / ED Diagnoses   Final diagnoses:  Allergic reaction, initial encounter  Urticaria    ED Discharge Orders        Ordered    cetirizine (ZYRTEC) 10 MG tablet  Daily     10/07/17 1430    predniSONE (DELTASONE) 20 MG tablet  2 times daily with meals     10/07/17 1430       Tanna Furry, MD 10/07/17 1435

## 2017-10-07 NOTE — ED Notes (Signed)
Pt reporting that his swelling in his throat and tingling in the left hand is going down. He currently denies pain.

## 2018-03-25 ENCOUNTER — Encounter
Admission: RE | Disposition: A | Discharge status: Home / Self Care | Payer: Self-pay | Source: Ambulatory Visit | Attending: Gastroenterology

## 2018-03-25 ENCOUNTER — Encounter: Payer: Self-pay | Admitting: Anesthesiology

## 2018-03-25 ENCOUNTER — Ambulatory Visit: Payer: Medicare Other | Admitting: Anesthesiology

## 2018-03-25 ENCOUNTER — Ambulatory Visit
Admission: RE | Admit: 2018-03-25 | Discharge: 2018-03-25 | Disposition: A | Discharge status: Home / Self Care | Payer: Medicare Other | Source: Ambulatory Visit | Attending: Gastroenterology | Admitting: Gastroenterology

## 2018-03-25 DIAGNOSIS — Z1211 Encounter for screening for malignant neoplasm of colon: Secondary | ICD-10-CM | POA: Insufficient documentation

## 2018-03-25 DIAGNOSIS — I120 Hypertensive chronic kidney disease with stage 5 chronic kidney disease or end stage renal disease: Secondary | ICD-10-CM | POA: Diagnosis not present

## 2018-03-25 DIAGNOSIS — K633 Ulcer of intestine: Secondary | ICD-10-CM | POA: Insufficient documentation

## 2018-03-25 DIAGNOSIS — D123 Benign neoplasm of transverse colon: Secondary | ICD-10-CM | POA: Diagnosis not present

## 2018-03-25 DIAGNOSIS — N186 End stage renal disease: Secondary | ICD-10-CM | POA: Diagnosis not present

## 2018-03-25 DIAGNOSIS — Z992 Dependence on renal dialysis: Secondary | ICD-10-CM | POA: Diagnosis not present

## 2018-03-25 DIAGNOSIS — K640 First degree hemorrhoids: Secondary | ICD-10-CM | POA: Diagnosis not present

## 2018-03-25 DIAGNOSIS — G4730 Sleep apnea, unspecified: Secondary | ICD-10-CM | POA: Insufficient documentation

## 2018-03-25 DIAGNOSIS — E785 Hyperlipidemia, unspecified: Secondary | ICD-10-CM | POA: Insufficient documentation

## 2018-03-25 DIAGNOSIS — I714 Abdominal aortic aneurysm, without rupture: Secondary | ICD-10-CM | POA: Diagnosis not present

## 2018-03-25 DIAGNOSIS — E1122 Type 2 diabetes mellitus with diabetic chronic kidney disease: Secondary | ICD-10-CM | POA: Insufficient documentation

## 2018-03-25 DIAGNOSIS — K5730 Diverticulosis of large intestine without perforation or abscess without bleeding: Secondary | ICD-10-CM | POA: Diagnosis not present

## 2018-03-25 HISTORY — PX: COLONOSCOPY WITH PROPOFOL: SHX5780

## 2018-03-25 LAB — BASIC METABOLIC PANEL
ANION GAP: 18 — AB (ref 5–15)
BUN: 52 mg/dL — ABNORMAL HIGH (ref 6–20)
CALCIUM: 7.7 mg/dL — AB (ref 8.9–10.3)
CO2: 26 mmol/L (ref 22–32)
Chloride: 94 mmol/L — ABNORMAL LOW (ref 98–111)
Creatinine, Ser: 12.96 mg/dL — ABNORMAL HIGH (ref 0.61–1.24)
GFR, EST AFRICAN AMERICAN: 4 mL/min — AB (ref >60)
GFR, EST NON AFRICAN AMERICAN: 4 mL/min — AB (ref >60)
GLUCOSE: 39 mg/dL — AB (ref 70–99)
Potassium: 3.8 mmol/L (ref 3.5–5.1)
Sodium: 138 mmol/L (ref 135–145)

## 2018-03-25 LAB — GLUCOSE, CAPILLARY
GLUCOSE-CAPILLARY: 34 mg/dL — AB (ref 70–99)
GLUCOSE-CAPILLARY: 123 mg/dL — AB (ref 70–99)
Glucose-Capillary: 34 mg/dL — CL (ref 70–99)
Glucose-Capillary: 104 mg/dL — ABNORMAL HIGH (ref 70–99)
Glucose-Capillary: 72 mg/dL (ref 70–99)

## 2018-03-25 SURGERY — COLONOSCOPY WITH PROPOFOL
Anesthesia: General

## 2018-03-25 MED ORDER — SODIUM CHLORIDE 0.9 % IV SOLN: INTRAVENOUS | Status: DC

## 2018-03-25 MED ORDER — PROPOFOL 500 MG/50ML IV EMUL
INTRAVENOUS | Status: AC
  Filled 2018-03-25: qty 50

## 2018-03-25 MED ORDER — PROPOFOL 500 MG/50ML IV EMUL: INTRAVENOUS | Status: DC | PRN

## 2018-03-25 MED ORDER — PROPOFOL 10 MG/ML IV BOLUS
INTRAVENOUS | Status: DC | PRN
  Administered 2018-03-25: 240 mg via INTRAVENOUS

## 2018-03-25 MED ORDER — DEXTROSE 50 % IV SOLN
37.5000 mL | Freq: Once | INTRAVENOUS | Status: AC
  Administered 2018-03-25: 37.5 mL via INTRAVENOUS

## 2018-03-25 MED ORDER — FENTANYL CITRATE (PF) 100 MCG/2ML IJ SOLN: 25.0000 ug | INTRAMUSCULAR | Status: DC | PRN

## 2018-03-25 MED ORDER — ONDANSETRON HCL 4 MG/2ML IJ SOLN: 4.0000 mg | Freq: Once | INTRAMUSCULAR | Status: DC | PRN

## 2018-03-25 MED ORDER — DEXTROSE 50 % IV SOLN
INTRAVENOUS | Status: AC
  Administered 2018-03-25: 37.5 mL
  Filled 2018-03-25: qty 50

## 2018-03-25 MED ORDER — SODIUM CHLORIDE 0.9 % IV SOLN
INTRAVENOUS | Status: DC
  Administered 2018-03-25: 1000 mL via INTRAVENOUS

## 2018-03-25 MED ORDER — LACTATED RINGERS IV SOLN
INTRAVENOUS | Status: DC | PRN
  Administered 2018-03-25: 10:00:00 via INTRAVENOUS

## 2018-03-25 NOTE — Transfer of Care (Signed)
Immediate Anesthesia Transfer of Care Note  Patient: James Brown  Procedure(s) Performed: COLONOSCOPY WITH PROPOFOL (N/A )  Patient Location: PACU and Endoscopy Unit  Anesthesia Type:General  Level of Consciousness: awake, alert  and oriented  Airway & Oxygen Therapy: Patient Spontanous Breathing  Post-op Assessment: Report given to RN  Post vital signs: stable  Last Vitals:  Vitals Value Taken Time  BP    Temp 36.3 C 03/25/2018 10:54 AM  Pulse 73 03/25/2018 10:53 AM  Resp 17 03/25/2018 10:53 AM  SpO2 100 % 03/25/2018 10:53 AM  Vitals shown include unvalidated device data.  Last Pain:  Vitals:   03/25/18 1054  TempSrc:   PainSc: 0-No pain         Complications: No apparent anesthesia complications

## 2018-03-25 NOTE — Anesthesia Preprocedure Evaluation (Addendum)
Anesthesia Evaluation  Patient identified by MRN, date of birth, ID band Patient awake    Reviewed: Allergy & Precautions, H&P , NPO status , Patient's Chart, lab work & pertinent test results  History of Anesthesia Complications Negative for: history of anesthetic complications  Airway Mallampati: II  TM Distance: >3 FB Neck ROM: Full    Dental  (+) Teeth Intact, Dental Advisory Given   Pulmonary sleep apnea ,    Pulmonary exam normal breath sounds clear to auscultation       Cardiovascular hypertension, (-) angina+ Peripheral Vascular Disease (AAA)  (-) CAD and (-) Past MI Normal cardiovascular exam Rhythm:Regular Rate:Normal     Neuro/Psych negative neurological ROS  negative psych ROS   GI/Hepatic negative GI ROS, Neg liver ROS,   Endo/Other  diabetesObesity   Renal/GU CRF and DialysisRenal disease (K+ 4.6)     Musculoskeletal negative musculoskeletal ROS (+)   Abdominal   Peds  Hematology negative hematology ROS (+)   Anesthesia Other Findings Last dialysis 12/15  Reproductive/Obstetrics                             Anesthesia Physical  Anesthesia Plan  ASA: IV  Anesthesia Plan: General   Post-op Pain Management:    Induction: Intravenous  PONV Risk Score and Plan:   Airway Management Planned: Nasal Cannula  Additional Equipment:   Intra-op Plan:   Post-operative Plan:   Informed Consent: I have reviewed the patients History and Physical, chart, labs and discussed the procedure including the risks, benefits and alternatives for the proposed anesthesia with the patient or authorized representative who has indicated his/her understanding and acceptance.   Dental advisory given  Plan Discussed with: CRNA and Anesthesiologist  Anesthesia Plan Comments: (GA vs MAC. Will confirm with surgeon.)        Anesthesia Quick Evaluation

## 2018-03-25 NOTE — Anesthesia Postprocedure Evaluation (Signed)
Anesthesia Post Note  Patient: James Brown  Procedure(s) Performed: COLONOSCOPY WITH PROPOFOL (N/A )  Patient location during evaluation: Endoscopy Anesthesia Type: General Level of consciousness: awake and alert and oriented Pain management: pain level controlled Respiratory status: spontaneous breathing Cardiovascular status: blood pressure returned to baseline Anesthetic complications: no     Last Vitals:  Vitals:   03/25/18 0830 03/25/18 1054  BP: 111/62   Pulse: 79   Resp: 20   Temp: (!) 35.6 C (!) 36.3 C  SpO2: 94%     Last Pain:  Vitals:   03/25/18 1054  TempSrc:   PainSc: 0-No pain                 Whitnie Deleon

## 2018-03-25 NOTE — Op Note (Signed)
[facility] Gastroenterology Patient Name: James Brown Procedure Date: 03/25/2018 8:28 AM MRN: [id number] Account #: [id number] Date of Birth: 1965-07-08 Admit Type: Outpatient Age: 52 Room: [facility] ENDO ROOM 3 Gender: Male Note Status: Finalized Procedure:            Colonoscopy Indications:          Screening for colorectal malignant neoplasm, This is                        the patient's first colonoscopy Providers:            Lollie Sails, MD Referring MD:         Mauricia Area, MD (Referring MD) Medicines:            Monitored Anesthesia Care Complications:        No immediate complications. Procedure:            Pre-Anesthesia Assessment:                       - ASA Grade Assessment: IV - A patient with severe                        systemic disease that is a constant threat to life.                       After obtaining informed consent, the colonoscope was                        passed under direct vision. Throughout the procedure,                        the patient's blood pressure, pulse, and oxygen                        saturations were monitored continuously. The                        Colonoscope was introduced through the anus and                        advanced to the the cecum, identified by appendiceal                        orifice and ileocecal valve. The colonoscopy was                        unusually difficult due to the patient's body habitus                        and the patient's inability to cooperate. Successful                        completion of the procedure was aided by changing the                        patient to a prone position. Findings:      Two sessile polyps were found in the transverse colon. The polyps were 3       to 4 mm in size. These polyps were removed with a cold biopsy forceps.  Resection and retrieval were complete.      Multiple medium-mouthed diverticula were found in the sigmoid colon,        descending colon and transverse colon.      Nonbleeding ulcerated appearing mucosa with no stigmata of recent       bleeding were present in the transverse colon. This was linear and       longitudinal, and possibly healed in appearance. Biopsies were taken of       these fissure like lesions and separate biopsies were taken from nearby       normal appearing mucosa.      The retroflexed view of the distal rectum and anal verge was normal and       showed no anal or rectal abnormalities.      Prominant internal anal pillars.      Non-bleeding internal hemorrhoids were found during anoscopy. The       hemorrhoids were small and Grade I (internal hemorrhoids that do not       prolapse).      The digital rectal exam was normal otherwise. Impression:           - Two 3 to 4 mm polyps in the transverse colon, removed                        with a cold biopsy forceps. Resected and retrieved.                       - Diverticulosis in the sigmoid colon, in the                        descending colon and in the transverse colon.                       - Mucosal ulceration.                       - The distal rectum and anal verge are normal on                        retroflexion view.                       - Non-bleeding internal hemorrhoids. Recommendation:       - Discharge patient to home.                       - Await pathology results.                       - Return to GI clinic in 1 month. Procedure Code(s):    --- Professional ---                       [phone], Colonoscopy, flexible; with biopsy, single or                        multiple Diagnosis Code(s):    --- Professional ---                       Z12.11, Encounter for screening for malignant neoplasm  of colon                       D12.3, Benign neoplasm of transverse colon (hepatic                        flexure or splenic flexure)                       K64.0, First degree hemorrhoids                        K63.3, Ulcer of intestine                       K57.30, Diverticulosis of large intestine without                        perforation or abscess without bleeding CPT copyright 2017 American Medical Association. All rights reserved. The codes documented in this report are preliminary and upon coder review may  be revised to meet current compliance requirements. Lollie Sails, MD 03/25/2018 10:51:43 AM This report has been signed electronically. Number of Addenda: 0 Note Initiated On: 03/25/2018 8:28 AM Scope Withdrawal Time: 0 hours 16 minutes 53 seconds  Total Procedure Duration: 0 hours 25 minutes 58 seconds       [facility]

## 2018-03-25 NOTE — Anesthesia Post-op Follow-up Note (Signed)
Anesthesia QCDR form completed.        

## 2018-03-25 NOTE — H&P (Signed)
Outpatient short stay form Pre-procedure 03/25/2018 9:52 AM James Sails MD  Primary Physician: Dr Dion Body  Reason for visit: Screening colonoscopy  History of present illness: Patient is a 52 year old male presenting today as above.  This is his first colonoscopy.  He arrived this morning and had his blood glucose checked it was low at 33.  Recheck verified this.  Subsequently he is currently been getting a D50 solution and we will recheck prior to the procedure.  His metabolic panel otherwise showed a chloride low of 94 CO2 normal at 26 BUN elevated at 52 with a creatinine of 12.96.(Patient have is a history of hemodialysis Monday Wednesday and Friday.)  Apparently patient had taken his oral agent last night while fasting.    Current Facility-Administered Medications:  .  0.9 %  sodium chloride infusion, , Intravenous, Continuous, James Sails, MD .  0.9 %  sodium chloride infusion, , Intravenous, Continuous, James Sails, MD, Last Rate: 20 mL/hr at 03/25/18 0902, 1,000 mL at 03/25/18 0902 .  fentaNYL (SUBLIMAZE) injection 25 mcg, 25 mcg, Intravenous, Q5 min PRN, Alvin Critchley, MD .  ondansetron [facility]) injection 4 mg, 4 mg, Intravenous, Once PRN, Alvin Critchley, MD  Medications Prior to Admission  Medication Sig Dispense Refill Last Dose  . calcium carbonate (TUMS) 500 MG chewable tablet Chew 1 tablet by mouth 3 (three) times daily as needed for indigestion.    Past Week at Unknown time  . cetirizine (ZYRTEC) 10 MG tablet Take 1 tablet (10 mg total) by mouth daily. 1 po q day prn allergies 10 tablet 0 Past Week at Unknown time  . chlorpheniramine-HYDROcodone (TUSSIONEX PENNKINETIC ER) 10-8 MG/5ML SUER Take 5 mLs by mouth at bedtime as needed for cough. 140 mL 0 Past Week at Unknown time  . Multiple Vitamins-Minerals (MULTIVITAMIN WITH MINERALS) tablet Take 1 tablet by mouth daily.   Past Week at Unknown time  . multivitamin (RENA-VIT) TABS tablet Take 1 tablet by  mouth daily.   Past Week at Unknown time  . oxyCODONE-acetaminophen (PERCOCET/ROXICET) 5-325 MG tablet Take 1-2 tablets by mouth every 4 (four) hours as needed for severe pain. 20 tablet 0 Past Week at Unknown time  . predniSONE (DELTASONE) 20 MG tablet Take 1 tablet (20 mg total) by mouth 2 (two) times daily with a meal. 6 tablet 0 Past Week at Unknown time  . simvastatin (ZOCOR) 80 MG tablet Take 80 mg by mouth.   03/24/2018 at Unknown time  . vardenafil (LEVITRA) 20 MG tablet Take 20 mg by mouth.   Past Week at Unknown time     No Known Allergies   Past Medical History:  Diagnosis Date  . AAA (abdominal aortic aneurysm) ([place])   . Constipation   . Dermatitis   . Diabetes ([place])    boderline  . ESRD (end stage renal disease) on dialysis [facility])    started 09/2006 MWF [facility],   [facility]  . Hyperlipidemia   . Hypertension   . Tinea versicolor     Review of systems:      Physical Exam    Heart and lungs: Good rate and rhythm without rub or gallop, lungs are bilaterally clear.    HEENT: Normocephalic atraumatic eyes are anicteric    Other:    Pertinant exam for procedure: Soft nontender nondistended bowel sounds positive normoactive    Planned proceedures: Anoscopy and indicated procedures. I have discussed the risks benefits and complications of procedures to include not limited to  bleeding, infection, perforation and the risk of sedation and the patient wishes to proceed.    James Sails, MD Gastroenterology 03/25/2018  9:52 AM

## 2018-03-26 ENCOUNTER — Encounter: Payer: Self-pay | Admitting: Gastroenterology

## 2018-03-26 LAB — GLUCOSE, CAPILLARY: Glucose-Capillary: 33 mg/dL — CL (ref 70–99)

## 2018-03-26 LAB — SURGICAL PATHOLOGY

## 2019-03-08 ENCOUNTER — Other Ambulatory Visit: Payer: Self-pay

## 2019-03-08 ENCOUNTER — Emergency Department (HOSPITAL_COMMUNITY): Payer: Medicare Other

## 2019-03-08 ENCOUNTER — Encounter (HOSPITAL_COMMUNITY): Payer: Self-pay | Admitting: Emergency Medicine

## 2019-03-08 ENCOUNTER — Inpatient Hospital Stay (HOSPITAL_COMMUNITY)
Admission: EM | Admit: 2019-03-08 | Discharge: 2019-03-13 | DRG: 871 | Disposition: A | Discharge status: Home / Self Care | Payer: Medicare Other | Attending: Internal Medicine | Admitting: Internal Medicine

## 2019-03-08 DIAGNOSIS — Z79899 Other long term (current) drug therapy: Secondary | ICD-10-CM

## 2019-03-08 DIAGNOSIS — J1289 Other viral pneumonia: Secondary | ICD-10-CM | POA: Diagnosis present

## 2019-03-08 DIAGNOSIS — E871 Hypo-osmolality and hyponatremia: Secondary | ICD-10-CM | POA: Diagnosis present

## 2019-03-08 DIAGNOSIS — E669 Obesity, unspecified: Secondary | ICD-10-CM | POA: Diagnosis present

## 2019-03-08 DIAGNOSIS — Z992 Dependence on renal dialysis: Secondary | ICD-10-CM | POA: Diagnosis not present

## 2019-03-08 DIAGNOSIS — E782 Mixed hyperlipidemia: Secondary | ICD-10-CM | POA: Diagnosis present

## 2019-03-08 DIAGNOSIS — A4189 Other specified sepsis: Secondary | ICD-10-CM | POA: Diagnosis present

## 2019-03-08 DIAGNOSIS — J9601 Acute respiratory failure with hypoxia: Secondary | ICD-10-CM | POA: Diagnosis present

## 2019-03-08 DIAGNOSIS — E860 Dehydration: Secondary | ICD-10-CM | POA: Diagnosis present

## 2019-03-08 DIAGNOSIS — Z6835 Body mass index (BMI) 35.0-35.9, adult: Secondary | ICD-10-CM

## 2019-03-08 DIAGNOSIS — E8889 Other specified metabolic disorders: Secondary | ICD-10-CM | POA: Diagnosis present

## 2019-03-08 DIAGNOSIS — Z7984 Long term (current) use of oral hypoglycemic drugs: Secondary | ICD-10-CM | POA: Diagnosis not present

## 2019-03-08 DIAGNOSIS — I9589 Other hypotension: Secondary | ICD-10-CM | POA: Diagnosis present

## 2019-03-08 DIAGNOSIS — E162 Hypoglycemia, unspecified: Secondary | ICD-10-CM | POA: Diagnosis present

## 2019-03-08 DIAGNOSIS — Z888 Allergy status to other drugs, medicaments and biological substances status: Secondary | ICD-10-CM

## 2019-03-08 DIAGNOSIS — Z8679 Personal history of other diseases of the circulatory system: Secondary | ICD-10-CM

## 2019-03-08 DIAGNOSIS — E11649 Type 2 diabetes mellitus with hypoglycemia without coma: Secondary | ICD-10-CM | POA: Diagnosis present

## 2019-03-08 DIAGNOSIS — I10 Essential (primary) hypertension: Secondary | ICD-10-CM | POA: Diagnosis present

## 2019-03-08 DIAGNOSIS — U071 COVID-19: Secondary | ICD-10-CM | POA: Diagnosis present

## 2019-03-08 DIAGNOSIS — E875 Hyperkalemia: Secondary | ICD-10-CM | POA: Diagnosis present

## 2019-03-08 DIAGNOSIS — E1129 Type 2 diabetes mellitus with other diabetic kidney complication: Secondary | ICD-10-CM | POA: Diagnosis present

## 2019-03-08 DIAGNOSIS — I120 Hypertensive chronic kidney disease with stage 5 chronic kidney disease or end stage renal disease: Secondary | ICD-10-CM | POA: Diagnosis present

## 2019-03-08 DIAGNOSIS — E1122 Type 2 diabetes mellitus with diabetic chronic kidney disease: Secondary | ICD-10-CM | POA: Diagnosis present

## 2019-03-08 DIAGNOSIS — J1282 Pneumonia due to coronavirus disease 2019: Secondary | ICD-10-CM

## 2019-03-08 DIAGNOSIS — N186 End stage renal disease: Secondary | ICD-10-CM | POA: Diagnosis present

## 2019-03-08 DIAGNOSIS — I714 Abdominal aortic aneurysm, without rupture: Secondary | ICD-10-CM | POA: Diagnosis present

## 2019-03-08 DIAGNOSIS — D638 Anemia in other chronic diseases classified elsewhere: Secondary | ICD-10-CM | POA: Diagnosis present

## 2019-03-08 DIAGNOSIS — J9690 Respiratory failure, unspecified, unspecified whether with hypoxia or hypercapnia: Secondary | ICD-10-CM

## 2019-03-08 DIAGNOSIS — Z09 Encounter for follow-up examination after completed treatment for conditions other than malignant neoplasm: Secondary | ICD-10-CM

## 2019-03-08 LAB — CBC WITH DIFFERENTIAL/PLATELET
Abs Immature Granulocytes: 0.01 10*3/uL (ref 0.00–0.07)
Basophils Absolute: 0 10*3/uL (ref 0.0–0.1)
Basophils Relative: 0 %
Eosinophils Absolute: 0 10*3/uL (ref 0.0–0.5)
Eosinophils Relative: 0 %
HCT: 35 % — ABNORMAL LOW (ref 39.0–52.0)
Hemoglobin: 11.3 g/dL — ABNORMAL LOW (ref 13.0–17.0)
Immature Granulocytes: 0 %
Lymphocytes Relative: 9 %
Lymphs Abs: 0.6 10*3/uL — ABNORMAL LOW (ref 0.7–4.0)
MCH: 26.7 pg (ref 26.0–34.0)
MCHC: 32.3 g/dL (ref 30.0–36.0)
MCV: 82.5 fL (ref 80.0–100.0)
Monocytes Absolute: 0.4 10*3/uL (ref 0.1–1.0)
Monocytes Relative: 6 %
Neutro Abs: 5.5 10*3/uL (ref 1.7–7.7)
Neutrophils Relative %: 85 %
Platelets: 177 10*3/uL (ref 150–400)
RBC: 4.24 MIL/uL (ref 4.22–5.81)
RDW: 14.6 % (ref 11.5–15.5)
WBC: 6.5 10*3/uL (ref 4.0–10.5)
nRBC: 0 % (ref 0.0–0.2)

## 2019-03-08 LAB — COMPREHENSIVE METABOLIC PANEL
ALT: 37 U/L (ref 0–44)
AST: 101 U/L — ABNORMAL HIGH (ref 15–41)
Albumin: 2.8 g/dL — ABNORMAL LOW (ref 3.5–5.0)
Alkaline Phosphatase: 20 U/L — ABNORMAL LOW (ref 38–126)
Anion gap: 20 — ABNORMAL HIGH (ref 5–15)
BUN: 78 mg/dL — ABNORMAL HIGH (ref 6–20)
CO2: 21 mmol/L — ABNORMAL LOW (ref 22–32)
Calcium: 7.3 mg/dL — ABNORMAL LOW (ref 8.9–10.3)
Chloride: 91 mmol/L — ABNORMAL LOW (ref 98–111)
Creatinine, Ser: 17.67 mg/dL — ABNORMAL HIGH (ref 0.61–1.24)
GFR calc Af Amer: 3 mL/min — ABNORMAL LOW (ref >60)
GFR calc non Af Amer: 3 mL/min — ABNORMAL LOW (ref >60)
Glucose, Bld: 76 mg/dL (ref 70–99)
Potassium: 4.3 mmol/L (ref 3.5–5.1)
Sodium: 132 mmol/L — ABNORMAL LOW (ref 135–145)
Total Bilirubin: 0.6 mg/dL (ref 0.3–1.2)
Total Protein: 7.3 g/dL (ref 6.5–8.1)

## 2019-03-08 LAB — C-REACTIVE PROTEIN: CRP: 8.7 mg/dL — ABNORMAL HIGH (ref <1.0)

## 2019-03-08 LAB — LACTIC ACID, PLASMA
Lactic Acid, Venous: 1.3 mmol/L (ref 0.5–1.9)
Lactic Acid, Venous: 0.6 mmol/L (ref 0.5–1.9)

## 2019-03-08 LAB — FIBRINOGEN: Fibrinogen: 540 mg/dL — ABNORMAL HIGH (ref 210–475)

## 2019-03-08 LAB — CBG MONITORING, ED
Glucose-Capillary: 71 mg/dL (ref 70–99)
Glucose-Capillary: 29 mg/dL — CL (ref 70–99)
Glucose-Capillary: 128 mg/dL — ABNORMAL HIGH (ref 70–99)
Glucose-Capillary: 185 mg/dL — ABNORMAL HIGH (ref 70–99)
Glucose-Capillary: 33 mg/dL — CL (ref 70–99)

## 2019-03-08 LAB — LACTATE DEHYDROGENASE: LDH: 282 U/L — ABNORMAL HIGH (ref 98–192)

## 2019-03-08 LAB — SARS CORONAVIRUS 2 BY RT PCR (HOSPITAL ORDER, PERFORMED IN ~~LOC~~ HOSPITAL LAB): SARS Coronavirus 2: POSITIVE — AB

## 2019-03-08 LAB — TRIGLYCERIDES: Triglycerides: 197 mg/dL — ABNORMAL HIGH (ref <150)

## 2019-03-08 LAB — FERRITIN: Ferritin: 3655 ng/mL — ABNORMAL HIGH (ref 24–336)

## 2019-03-08 LAB — PROCALCITONIN: Procalcitonin: 3.02 ng/mL

## 2019-03-08 LAB — D-DIMER, QUANTITATIVE: D-Dimer, Quant: 1.22 ug/mL-FEU — ABNORMAL HIGH (ref 0.00–0.50)

## 2019-03-08 MED ORDER — DEXTROSE 5 % IV SOLN
INTRAVENOUS | Status: DC
  Administered 2019-03-08: 17:00:00 via INTRAVENOUS

## 2019-03-08 MED ORDER — DEXTROSE 10 % IV SOLN
INTRAVENOUS | Status: DC
  Administered 2019-03-08: 20:00:00 via INTRAVENOUS

## 2019-03-08 MED ORDER — DEXTROSE 50 % IV SOLN
INTRAVENOUS | Status: AC
  Administered 2019-03-08: 23:00:00
  Filled 2019-03-08: qty 50

## 2019-03-08 MED ORDER — CHLORHEXIDINE GLUCONATE CLOTH 2 % EX PADS
6.0000 | MEDICATED_PAD | Freq: Every day | CUTANEOUS | Status: DC
  Administered 2019-03-10 – 2019-03-11 (×2): 6 via TOPICAL

## 2019-03-08 MED ORDER — ACETAMINOPHEN 325 MG PO TABS
650.0000 mg | ORAL_TABLET | Freq: Once | ORAL | Status: AC
  Administered 2019-03-08: 650 mg via ORAL
  Filled 2019-03-08: qty 2

## 2019-03-08 MED ORDER — DEXTROSE 50 % IV SOLN
50.0000 mL | Freq: Once | INTRAVENOUS | Status: AC
  Administered 2019-03-08: 50 mL via INTRAVENOUS

## 2019-03-08 MED ORDER — DEXTROSE 50 % IV SOLN
1.0000 | Freq: Once | INTRAVENOUS | Status: AC
  Administered 2019-03-08: 50 mL via INTRAVENOUS

## 2019-03-08 MED ORDER — DEXTROSE 50 % IV SOLN
INTRAVENOUS | Status: AC
  Administered 2019-03-08: 18:00:00
  Filled 2019-03-08: qty 50

## 2019-03-08 MED ORDER — DEXTROSE 50 % IV SOLN
INTRAVENOUS | Status: AC
  Administered 2019-03-08: 22:00:00
  Filled 2019-03-08: qty 50

## 2019-03-08 NOTE — ED Provider Notes (Signed)
[place] EMERGENCY DEPARTMENT Provider Note   CSN: [id number] Arrival date & time: 03/08/19  1431     History   Chief Complaint No chief complaint on file.   HPI James Brown is a 53 y.o. male.     HPI   James Brown is a 53 y.o. male, with a history of AAA, DM, ESRD on dialysis, presenting to the ED with generally feeling unwell for about the past week. Patient is accompanied by his daughter at the bedside.  She states patient was confused upon waking this morning around 5 AM.  They called EMS, who found patient to be hypoglycemic, he improved back to baseline with administration of D10, and refused transport.  Patient's family tried to get patient to eat some food, but patient stated he just did not feel like eating.  They were able to get him to eat some snacks and part of a sandwich. This afternoon, patient again began acting confused.  EMS was again called and patient's blood sugar was noted to be 60.  After administration of D10, patient again improved. Here in the ED, patient states, "I just do not feel well."  He states he did not take his medications yesterday or today.  Daughter states patient felt warm and they noted a temperature of 100 F.  He received Tylenol around noon.  Patient is on a Monday, Wednesday, Friday dialysis schedule with last dialysis performed September 11.  He denies cough, shortness of breath, chest pain, abdominal pain, N/V/D, syncope, or any other complaints.  Past Medical History:  Diagnosis Date  . AAA (abdominal aortic aneurysm) ([place])   . Constipation   . Dermatitis   . Diabetes ([place])    boderline  . ESRD (end stage renal disease) on dialysis [facility])    started 09/2006 MWF [facility],   [facility]  . Hyperlipidemia   . Hypertension   . Tinea versicolor     Patient Active Problem List   Diagnosis Date Noted  . COVID-19 virus infection 03/08/2019  . Hypoglycemia 03/08/2019  . Mechanical complication of  other vascular device, implant, and graft-Right arm  10/05/2013  . Redness-Right arm  10/05/2013  . 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 DM (diabetes mellitus) type II controlled with renal manifestation ([place]) 10/21/2012  . Hyperparathyroidism, secondary ([place]) 10/09/2012  . Other complications due to renal dialysis device, implant, and graft 07/23/2011  . ESRD 04/28/2009  . Essential hypertension 01/30/2009  . Hyperlipidemia, mixed 01/25/2009  . OBSTRUCTIVE SLEEP APNEA 01/25/2009    Past Surgical History:  Procedure Laterality Date  . AV FISTULA PLACEMENT  06/2005   Right Arm  . AV FISTULA PLACEMENT Right 09/04/2013   Procedure: REVISION OF RIGHT BRACHIOCEPHALIC FISTULA WITH INSERTION OF ARTERIOVENOUS (AV) GORE-TEX GRAFT ARM;  Surgeon: Mal Misty, MD;  Location: [place];  Service: Vascular;  Laterality: Right;  . AV FISTULA PLACEMENT Left 06/10/2015   Procedure: ARTERIOVENOUS (AV) FISTULA CREATION LEFT UPPER ARM;  Surgeon: Angelia Mould, MD;  Location: [place];  Service: Vascular;  Laterality: Left;  . COLONOSCOPY WITH PROPOFOL N/A 03/25/2018   Procedure: COLONOSCOPY WITH PROPOFOL;  Surgeon: Lollie Sails, MD;  Location: [facility] ENDOSCOPY;  Service: Endoscopy;  Laterality: N/A;  . PARATHYROIDECTOMY    . PARATHYROIDECTOMY Left 10/21/2012   Procedure: TOTAL PARATHYROIDECTOMY WITH AUTOTRANSPLANT TO THE LEFT FOREARM;  Surgeon: Earnstine Regal, MD;  Location: [place];  Service: General;  Laterality: Left;        Home  Medications    Prior to Admission medications   Medication Sig Start Date End Date Taking? Authorizing Provider  atorvastatin (LIPITOR) 20 MG tablet Take 20 mg by mouth daily.   Yes [provider]  Calcium Acetate 667 MG TABS Take 1 tablet by mouth daily.   Yes [provider]  calcium carbonate (TUMS) 500 MG chewable tablet Chew 1 tablet by mouth 3 (three) times daily as needed for indigestion.    Yes [provider]  fenofibrate 160 MG  tablet Take 160 mg by mouth daily.   Yes [provider]  glimepiride (AMARYL) 1 MG tablet Take 2 mg by mouth daily with breakfast.   Yes [provider]  midodrine (PROAMATINE) 10 MG tablet Take 10 mg by mouth 3 (three) times daily.   Yes [provider]  simvastatin (ZOCOR) 80 MG tablet Take 80 mg by mouth.   Yes [provider]    Family History Family History  Adopted: Yes    Social History Social History   Tobacco Use  . Smoking status: Never Smoker  . Smokeless tobacco: Never Used  Substance Use Topics  . Alcohol use: No    Alcohol/week: 0.0 standard drinks  . Drug use: No     Allergies   Aplisol [tuberculin]   Review of Systems Review of Systems  Constitutional: Positive for activity change, appetite change and fever.  Respiratory: Negative for cough and shortness of breath.   Cardiovascular: Negative for chest pain and leg swelling.  Gastrointestinal: Negative for abdominal pain, blood in stool, diarrhea, nausea and vomiting.  Musculoskeletal: Negative for back pain and neck pain.  Neurological: Positive for weakness. Negative for syncope.  Psychiatric/Behavioral: Positive for confusion (resolved).  All other systems reviewed and are negative.    Physical Exam Updated Vital Signs BP 99/63 (BP Location: Right Arm)   Pulse 95   Temp 99.2 F (37.3 C) (Oral)   Resp 16   Ht 5\' 6"  (1.676 m)   Wt 99.8 kg   SpO2 97%   BMI 35.51 kg/m   Physical Exam Vitals signs and nursing note reviewed.  Constitutional:      Appearance: He is well-developed. He is ill-appearing. He is not diaphoretic.  HENT:     Head: Normocephalic and atraumatic.     Mouth/Throat:     Mouth: Mucous membranes are moist.     Pharynx: Oropharynx is clear.  Eyes:     Conjunctiva/sclera: Conjunctivae normal.  Neck:     Musculoskeletal: Neck supple.  Cardiovascular:     Rate and Rhythm: Normal rate and regular rhythm.     Pulses: Normal pulses.           Radial pulses are 2+ on the right side and 2+ on the left side.       Posterior tibial pulses are 2+ on the right side and 2+ on the left side.     Heart sounds: Normal heart sounds.     Comments: Tactile temperature in the extremities appropriate and equal bilaterally. Pulmonary:     Effort: Pulmonary effort is normal. No respiratory distress.     Breath sounds: Normal breath sounds.  Abdominal:     Palpations: Abdomen is soft.     Tenderness: There is no abdominal tenderness. There is no guarding.  Musculoskeletal:     Right lower leg: No edema.     Left lower leg: No edema.  Lymphadenopathy:     Cervical: No cervical adenopathy.  Skin:  General: Skin is warm and dry.     Comments: Fistula nontender and nonerythematous.  No evidence of infection.  Neurological:     Mental Status: He is alert and oriented to person, place, and time.  Psychiatric:        Mood and Affect: Mood and affect normal.        Speech: Speech normal.        Behavior: Behavior normal.      ED Treatments / Results  Labs (all labs ordered are listed, but only abnormal results are displayed) Labs Reviewed  SARS CORONAVIRUS 2 (HOSPITAL ORDER, [facility]) - Abnormal; Notable for the following components:      Result Value   SARS Coronavirus 2 POSITIVE (*)    All other components within normal limits  COMPREHENSIVE METABOLIC PANEL - Abnormal; Notable for the following components:   Sodium 132 (*)    Chloride 91 (*)    CO2 21 (*)    BUN 78 (*)    Creatinine, Ser 17.67 (*)    Calcium 7.3 (*)    Albumin 2.8 (*)    AST 101 (*)    Alkaline Phosphatase 20 (*)    GFR calc non Af Amer 3 (*)    GFR calc Af Amer 3 (*)    Anion gap 20 (*)    All other components within normal limits  CBC WITH DIFFERENTIAL/PLATELET - Abnormal; Notable for the following components:   Hemoglobin 11.3 (*)    HCT 35.0 (*)    Lymphs Abs 0.6 (*)    All other components within normal limits  CBG  MONITORING, ED - Abnormal; Notable for the following components:   Glucose-Capillary 29 (*)    All other components within normal limits  CBG MONITORING, ED - Abnormal; Notable for the following components:   Glucose-Capillary 128 (*)    All other components within normal limits  CULTURE, BLOOD (ROUTINE X 2)  CULTURE, BLOOD (ROUTINE X 2)  LACTIC ACID, PLASMA  LACTIC ACID, PLASMA  CBG MONITORING, ED    EKG EKG Interpretation  Date/Time:  Sunday March 08 2019 14:32:33 EDT Ventricular Rate:  97 PR Interval:    QRS Duration: 94 QT Interval:  377 QTC Calculation: 479 R Axis:   -46 Text Interpretation:  Sinus rhythm Left anterior fascicular block Low voltage, precordial leads Abnormal R-wave progression, late transition Borderline prolonged QT interval since last tracing no significant change Confirmed by Malvin Johns [id number]) on 03/08/2019 4:59:50 PM   Radiology Dg Chest Portable 1 View  Result Date: 03/08/2019 CLINICAL DATA:  Acute weakness and fever. EXAM: PORTABLE CHEST 1 VIEW COMPARISON:  06/23/2015 and prior radiographs FINDINGS: The cardiomediastinal silhouette is unremarkable. There is no evidence of focal airspace disease, pulmonary edema, suspicious pulmonary nodule/mass, pleural effusion, or pneumothorax. No acute bony abnormalities are identified. IMPRESSION: No active disease. Electronically Signed   By: Margarette Canada M.D.   On: 03/08/2019 15:53    Procedures .Critical Care Performed by: Lorayne Bender, PA-C Authorized by: Lorayne Bender, PA-C   Critical care provider statement:    Critical care time (minutes):  35   Critical care time was exclusive of:  Separately billable procedures and treating other patients   Critical care was necessary to treat or prevent imminent or life-threatening deterioration of the following conditions:  Metabolic crisis   Critical care was time spent personally by me on the following activities:  Development of treatment plan with patient or  surrogate, evaluation of patient's response to treatment, examination of patient, obtaining history from patient or surrogate, ordering and performing treatments and interventions, ordering and review of laboratory studies, ordering and review of radiographic studies, pulse oximetry and re-evaluation of patient's condition   I assumed direction of critical care for this patient from another provider in my specialty: no     (including critical care time)  Medications Ordered in ED Medications  dextrose 10 % infusion ( Intravenous New Bag/Given 03/08/19 2026)  Chlorhexidine Gluconate Cloth 2 % PADS 6 each (has no administration in time range)  dextrose 50 % solution (has no administration in time range)  acetaminophen (TYLENOL) tablet 650 mg (650 mg Oral Given 03/08/19 1655)  dextrose 50 % solution (  Given 03/08/19 1826)  dextrose 50 % solution 50 mL (50 mLs Intravenous Given 03/08/19 2153)     Initial Impression / Assessment and Plan / ED Course  I have reviewed the triage vital signs and the nursing notes.  Pertinent labs & imaging results that were available during my care of the patient were reviewed by me and considered in my medical decision making (see chart for details).  Clinical Course as of Mar 07 2153  Sun Mar 08, 2019  1947 Spoke with Dr. Jonelle Sidle, hospitalist. Agrees to admit the patient.    [SJ]  1949 Spoke with Dr. Royce Macadamia, nephrologist.  D10 may run at a maximum of 40 mL/h, but as soon as patient's blood sugars are controlled, discontinue.   [SJ]    Clinical Course User Index [SJ] Azaliah Carrero C, PA-C       Patient presents initially with complaint of intermittent hypoglycemia.  He had 2 episodes of hypoglycemia prior to arrival. The challenge with this patient was striking a balance between maintaining an adequate blood glucose level and avoiding fluid overload.  D50 would, of course, raise his blood sugar for the time, but then the level would again drop.  Slow infusion of  D10 was initiated with regular assessment of patient's breathing status, SPO2, and lung sounds. During times when the patient's blood sugar was at an adequate level, he was alert and oriented. At no time during my care the patient did he exhibit difficulty breathing or sustained drop in his SPO2. Patient did test positive for COVID-19.  In regards to his hypotension, patient states he typically has a systolic blood pressure in the 90s.  This was verified upon review of past vital signs.  He adds, "They sometimes have to give me medication to raise my blood pressure before I start dialysis."  Patient was admitted for further management of his recurrent hypoglycemia.   Findings and plan of care discussed with Malvin Johns, MD.    Vitals:   03/08/19 1845 03/08/19 1915 03/08/19 1930 03/08/19 2000  BP: (!) 87/48 (!) 95/49 (!) 99/59 102/61  Pulse: 77 76 87 83  Resp: 15 19 (!) 21 16  Temp:      TempSrc:      SpO2: 99% 97% 96% 92%  Weight:      Height:         Final Clinical Impressions(s) / ED Diagnoses   Final diagnoses:  Hypoglycemia  COVID-19    ED Discharge Orders    None       Layla Maw 03/08/19 2154    Malvin Johns, MD 03/08/19 2202

## 2019-03-08 NOTE — H&P (Signed)
History and Physical   Roberta Lippard EPP:[id number] DOB: [birth year] DOA: 03/08/2019  Referring MD/NP/PA: Dr. Tamera Punt  PCP: Mauricia Area, MD   Outpatient Specialists: [facility]  Patient coming from: Home  Chief Complaint: Fever and weakness  HPI: James Brown is a 53 y.o. male with medical history significant of end-stage renal disease on hemodialysis on Mondays Wednesdays Fridays, abdominal aortic aneurysm, diabetes, hypertension, hyperlipidemia, who was brought in by his daughter and EMS after patient was found confused this morning.  He has been feeling unwell over the past week.  He has had fever and occasional chills.  He has continued to go to his hemodialysis.  Patient was seen by EMS and found to have significant hypoglycemia.  Sugar was about 60.  He will receive D10.  Initially refused transport but this happened once again so he agrees to come to the ER where he was still found to have blood sugar in the 60s.  Patient initiated on D10 drip.  As part of his work-up for not feeling good COVID-19 testing was done and was positive.  Patient denied any known contacts.  He is not having any significant respiratory symptoms except for shortness of breath and cough which is mild.  Chest x-ray and further evaluation showed no findings typical of COVID-19 infection.  Patient is not requiring any oxygen.  He is being admitted however for further work-up.  ED Course: Temperature is 100.9 blood pressure 85/58 with pulse of 89 respirate of 21 oxygen sat 91% on room air.  White count 6.5 hemoglobin 11.3 platelets 177.  Sodium is 132 potassium 4.3 chloride 91 CO2 21 BUN 78 creatinine 17.67 and calcium 7.3.  Procalcitonin 3.02.  COVID-19 screen is positive.  LDH 202 triglyceride 197 ferritin 3655 CRP of 8.7 d-dimer 1.22 and fibrinogen 540.  CBC largely within normal.  Patient initiated on oxygen.  Nephrology consulted and patient will be admitted for further work-up   Review of Systems: As per HPI otherwise 10 point review of systems negative.    Past Medical History:  Diagnosis Date  . AAA (abdominal aortic aneurysm) ([place])   . Constipation   . Dermatitis   . Diabetes ([place])    boderline  . ESRD (end stage renal disease) on dialysis [facility])    started 09/2006 MWF [facility],   [facility]  . Hyperlipidemia   . Hypertension   . Tinea versicolor     Past Surgical History:  Procedure Laterality Date  . AV FISTULA PLACEMENT  06/2005   Right Arm  . AV FISTULA PLACEMENT Right 09/04/2013   Procedure: REVISION OF RIGHT BRACHIOCEPHALIC FISTULA WITH INSERTION OF ARTERIOVENOUS (AV) GORE-TEX GRAFT ARM;  Surgeon: Mal Misty, MD;  Location: [place];  Service: Vascular;  Laterality: Right;  . AV FISTULA PLACEMENT Left 06/10/2015   Procedure: ARTERIOVENOUS (AV) FISTULA CREATION LEFT UPPER ARM;  Surgeon: Angelia Mould, MD;  Location: [place];  Service: Vascular;  Laterality: Left;  . COLONOSCOPY WITH PROPOFOL N/A 03/25/2018   Procedure: COLONOSCOPY WITH PROPOFOL;  Surgeon: Lollie Sails, MD;  [facility] ENDOSCOPY;  Service: Endoscopy;  Laterality: N/A;  . PARATHYROIDECTOMY    . PARATHYROIDECTOMY Left 10/21/2012   Procedure: TOTAL PARATHYROIDECTOMY WITH AUTOTRANSPLANT TO THE LEFT FOREARM;  Surgeon: Earnstine Regal, MD;  Location: [place];  Service: General;  Laterality: Left;     reports that he has never smoked. He has never used smokeless tobacco. He reports that he does not drink alcohol or use drugs.  Allergies  Allergen Reactions  . Aplisol [Tuberculin] Swelling    Family History  Adopted: Yes     Prior to Admission medications   Medication Sig Start Date End Date Taking? Authorizing Provider  atorvastatin (LIPITOR) 20 MG tablet Take 20 mg by mouth daily.   Yes [provider]  Calcium Acetate 667 MG TABS Take 1 tablet by mouth daily.   Yes [provider]  calcium carbonate (TUMS) 500 MG chewable tablet Chew 1 tablet by mouth  3 (three) times daily as needed for indigestion.    Yes [provider]  fenofibrate 160 MG tablet Take 160 mg by mouth daily.   Yes [provider]  glimepiride (AMARYL) 1 MG tablet Take 2 mg by mouth daily with breakfast.   Yes [provider]  midodrine (PROAMATINE) 10 MG tablet Take 10 mg by mouth 3 (three) times daily.   Yes [provider]  simvastatin (ZOCOR) 80 MG tablet Take 80 mg by mouth.   Yes [provider]    Physical Exam: Vitals:   03/08/19 1834 03/08/19 1845 03/08/19 1915 03/08/19 1930  BP: (!) 87/60 (!) 87/48 (!) 95/49 (!) 99/59  Pulse: 82 77 76 87  Resp: 18 15 19  (!) 21  Temp: 99.1 F (37.3 C)     TempSrc:      SpO2: 96% 99% 97% 96%  Weight:      Height:          Constitutional: Obese, pleasant no acute distress Vitals:   03/08/19 1834 03/08/19 1845 03/08/19 1915 03/08/19 1930  BP: (!) 87/60 (!) 87/48 (!) 95/49 (!) 99/59  Pulse: 82 77 76 87  Resp: 18 15 19  (!) 21  Temp: 99.1 F (37.3 C)     TempSrc:      SpO2: 96% 99% 97% 96%  Weight:      Height:       Eyes: PERRL, lids and conjunctivae normal ENMT: Mucous membranes are dry posterior pharynx clear of any exudate or lesions.Normal dentition.  Neck: normal, supple, no masses, no thyromegaly Respiratory: Decreased air entry bilaterally with some crackles at the bases. Normal respiratory effort. No accessory muscle use.  Cardiovascular: Regular rate and rhythm, no murmurs / rubs / gallops. No extremity edema. 2+ pedal pulses. No carotid bruits.  Abdomen: no tenderness, no masses palpated. No hepatosplenomegaly. Bowel sounds positive.  Musculoskeletal: no clubbing / cyanosis. No joint deformity upper and lower extremities. Good ROM, no contractures. Normal muscle tone.  Skin: no rashes, lesions, ulcers. No induration Neurologic: CN 2-12 grossly intact. Sensation intact, DTR normal. Strength 5/5 in all 4.  Psychiatric: Normal judgment and insight. Alert and  oriented x 3. Normal mood.     Labs on Admission: I have personally reviewed following labs and imaging studies  CBC: Recent Labs  Lab 03/08/19 1553  WBC 6.5  NEUTROABS 5.5  HGB 11.3*  HCT 35.0*  MCV 82.5  PLT 782   Basic Metabolic Panel: Recent Labs  Lab 03/08/19 1553  NA 132*  K 4.3  CL 91*  CO2 21*  GLUCOSE 76  BUN 78*  CREATININE 17.67*  CALCIUM 7.3*   GFR: Estimated Creatinine Clearance: 5.5 mL/min (A) (by C-G formula based on SCr of 17.67 mg/dL (H)). Liver Function Tests: Recent Labs  Lab 03/08/19 1553  AST 101*  ALT 37  ALKPHOS 20*  BILITOT 0.6  PROT 7.3  ALBUMIN 2.8*   No results for input(s): LIPASE, AMYLASE in the last 168 hours. No  results for input(s): AMMONIA in the last 168 hours. Coagulation Profile: No results for input(s): INR, PROTIME in the last 168 hours. Cardiac Enzymes: No results for input(s): CKTOTAL, CKMB, CKMBINDEX, TROPONINI in the last 168 hours. BNP (last 3 results) No results for input(s): PROBNP in the last 8760 hours. HbA1C: No results for input(s): HGBA1C in the last 72 hours. CBG: Recent Labs  Lab 03/08/19 1600 03/08/19 1803 03/08/19 1832  GLUCAP 71 29* 128*   Lipid Profile: Recent Labs    03/08/19 2026  TRIG 197*   Thyroid Function Tests: No results for input(s): TSH, T4TOTAL, FREET4, T3FREE, THYROIDAB in the last 72 hours. Anemia Panel: No results for input(s): VITAMINB12, FOLATE, FERRITIN, TIBC, IRON, RETICCTPCT in the last 72 hours. Urine analysis: No results found for: COLORURINE, APPEARANCEUR, LABSPEC, PHURINE, GLUCOSEU, HGBUR, BILIRUBINUR, KETONESUR, PROTEINUR, UROBILINOGEN, NITRITE, LEUKOCYTESUR Sepsis Labs: @LABRCNTIP (procalcitonin:4,lacticidven:4) ) Recent Results (from the past 240 hour(s))  SARS Coronavirus 2 [facility] order, Performed in [facility] hospital lab) Nasopharyngeal Nasopharyngeal Swab     Status: Abnormal   Collection Time: 03/08/19  4:01 PM   Specimen: Nasopharyngeal Swab   Result Value Ref Range Status   SARS Coronavirus 2 POSITIVE (A) NEGATIVE Final    Comment: CRITICAL RESULT CALLED TO, READ BACK BY AND VERIFIED WITH: RN [place], [place] [id number] FCP (NOTE) If result is NEGATIVE SARS-CoV-2 target nucleic acids are NOT DETECTED. The SARS-CoV-2 RNA is generally detectable in upper and lower  respiratory specimens during the acute phase of infection. The lowest  concentration of SARS-CoV-2 viral copies this assay can detect is 250  copies / mL. A negative result does not preclude SARS-CoV-2 infection  and should not be used as the sole basis for treatment or other  patient management decisions.  A negative result may occur with  improper specimen collection / handling, submission of specimen other  than nasopharyngeal swab, presence of viral mutation(s) within the  areas targeted by this assay, and inadequate number of viral copies  (<250 copies / mL). A negative result must be combined with clinical  observations, patient history, and epidemiological information. If result is POSITIVE SARS-CoV-2 target nucleic acids are DETECT ED. The SARS-CoV-2 RNA is generally detectable in upper and lower  respiratory specimens during the acute phase of infection.  Positive  results are indicative of active infection with SARS-CoV-2.  Clinical  correlation with patient history and other diagnostic information is  necessary to determine patient infection status.  Positive results do  not rule out bacterial infection or co-infection with other viruses. If result is PRESUMPTIVE POSTIVE SARS-CoV-2 nucleic acids MAY BE PRESENT.   A presumptive positive result was obtained on the submitted specimen  and confirmed on repeat testing.  While 2019 novel coronavirus  (SARS-CoV-2) nucleic acids may be present in the submitted sample  additional confirmatory testing may be necessary for epidemiological  and / or clinical management purposes  to differentiate between   SARS-CoV-2 and other Sarbecovirus currently known to infect humans.  If clinically indicated additional testing with an alternate test  methodology (LAB74 [id number]) is advised. The SARS-CoV-2 RNA is generally  detectable in upper and lower respiratory specimens during the acute  phase of infection. The expected result is Negative. Fact Sheet for Patients:  StrictlyIdeas.no Fact Sheet for Healthcare Providers: BankingDealers.co.za This test is not yet approved or cleared by the [place] FDA and has been authorized for detection and/or diagnosis of SARS-CoV-2 by FDA under an Emergency Use Authorization (EUA).  This EUA will  remain in effect (meaning this test can be used) for the duration of the COVID-19 declaration under Section 564(b)(1) of the Act, 21 U.S.C. section 360bbb-3(b)(1), unless the authorization is terminated or revoked sooner. Performed at [facility], [place] [address] [place], [place] [address]      Radiological Exams on Admission: Dg Chest Portable 1 View  Result Date: 03/08/2019 CLINICAL DATA:  Acute weakness and fever. EXAM: PORTABLE CHEST 1 VIEW COMPARISON:  06/23/2015 and prior radiographs FINDINGS: The cardiomediastinal silhouette is unremarkable. There is no evidence of focal airspace disease, pulmonary edema, suspicious pulmonary nodule/mass, pleural effusion, or pneumothorax. No acute bony abnormalities are identified. IMPRESSION: No active disease. Electronically Signed   By: Margarette Canada M.D.   On: 03/08/2019 15:53     Assessment/Plan Principal Problem:   Hypoglycemia Active Problems:   Hyperlipidemia, mixed   Essential hypertension   ESRD   DM (diabetes mellitus) type II controlled with renal manifestation ([place])   COVID-19 virus infection     #1 hypoglycemia: Patient is on glimepiride.  We will hold it.  In the setting of known dialysis patient probably retaining H and extending its half-life.   Continue with the D10 but decreased to 40 cc an hour.  Continue to check blood sugars every 2 hours until stabilized.  Use sliding scale insulin thereafter.  #2  COVID-19 infection: At this point patient is not hypoxic.  He is not requiring oxygen or having significant respiratory symptoms.  I will mainly do supportive care with DVT prophylaxis.  Patient is not a candidate for Actemra or MDS severe.  We will hold off on steroids also.  #3 essential hypertension: Patient is relatively hypotensive.  He is on Midodrin.  We will resume that and continue treatment.  #4 end-stage renal disease: On hemodialysis Mondays Wednesdays and Fridays.  Scheduled for hemodialysis tomorrow.  Nephrology consulted.  #5 diabetes: Continue aspirin No. 1  #6 hyperlipidemia: Continue home regimen.   DVT prophylaxis: Heparin Code Status: Full code Family Communication: Daughter at bedside Disposition Plan: To be determined Consults called: Nephrology Admission status: Inpatient  Severity of Illness: The appropriate patient status for this patient is INPATIENT. Inpatient status is judged to be reasonable and necessary in order to provide the required intensity of service to ensure the patient's safety. The patient's presenting symptoms, physical exam findings, and initial radiographic and laboratory data in the context of their chronic comorbidities is felt to place them at high risk for further clinical deterioration. Furthermore, it is not anticipated that the patient will be medically stable for discharge from the hospital within 2 midnights of admission. The following factors support the patient status of inpatient.   " The patient's presenting symptoms include confusion and weakness with fever. " The worrisome physical exam findings include mild dehydration. " The initial radiographic and laboratory data are worrisome because of evidence of COVID-19. " The chronic co-morbidities include end-stage renal disease.    * I certify that at the point of admission it is my clinical judgment that the patient will require inpatient hospital care spanning beyond 2 midnights from the point of admission due to high intensity of service, high risk for further deterioration and high frequency of surveillance required.Barbette Merino MD [facility] Pager [phone]  If 7PM-7AM, please contact night-coverage [email or web address] Password [email or web address]  03/08/2019, 9:18 PM

## 2019-03-08 NOTE — ED Notes (Signed)
Unsuccessful blood draw attempt, RN informed

## 2019-03-08 NOTE — ED Notes (Signed)
CBG 29, Joy, PA at the bedside, 1 amp d50 given, will give patient sandwich and juice

## 2019-03-08 NOTE — ED Notes (Signed)
ED TO INPATIENT HANDOFF REPORT  ED Nurse Name and Phone #: Harriette Bouillon [phone]  S Name/Age/Gender James Brown 53 y.o. male Room/Bed: 046C/046C  Code Status   Code Status: Prior  Home/SNF/Other Home Patient oriented to: self, place, time and situation Is this baseline? Yes   Triage Complete: Triage complete  Chief Complaint HYPOGLYCEMIA AND HYPOTENSION  Triage Note [facility]- pt here for evaluation of hypoglycemia and hypotension. Pt is a diabetic but currently working on trying to get meds for his diabetes. When EMS arrived pts sugar was 66. EMS gave about 100 mL of D10 through an IV and gave 100 mL of NS for blood pressure.   Allergies Allergies  Allergen Reactions  . Aplisol [Tuberculin] Swelling    Level of Care/Admitting Diagnosis ED Disposition    ED Disposition Condition [facility] Area: [place] [[id number]]  Level of Care: Telemetry Medical [104]  Covid Evaluation: Confirmed COVID Positive  Diagnosis: COVID-19 virus infection [[id number]]  Admitting Physician: Elwyn Reach [[id number]]  Attending Physician: Elwyn Reach [[id number]]  Estimated length of stay: past midnight tomorrow  Certification:: I certify this patient will need inpatient services for at least 2 midnights  PT Class (Do Not Modify): Inpatient [101]  PT Acc Code (Do Not Modify): Private [1]       B Medical/Surgery History Past Medical History:  Diagnosis Date  . AAA (abdominal aortic aneurysm) ([place])   . Constipation   . Dermatitis   . Diabetes ([place])    boderline  . ESRD (end stage renal disease) on dialysis [facility])    started 09/2006 MWF [facility],   [facility]  . Hyperlipidemia   . Hypertension   . Tinea versicolor    Past Surgical History:  Procedure Laterality Date  . AV FISTULA PLACEMENT  06/2005   Right Arm  . AV FISTULA PLACEMENT Right 09/04/2013   Procedure: REVISION OF RIGHT BRACHIOCEPHALIC FISTULA WITH INSERTION OF ARTERIOVENOUS (AV)  GORE-TEX GRAFT ARM;  Surgeon: Mal Misty, MD;  Location: [place];  Service: Vascular;  Laterality: Right;  . AV FISTULA PLACEMENT Left 06/10/2015   Procedure: ARTERIOVENOUS (AV) FISTULA CREATION LEFT UPPER ARM;  Surgeon: Angelia Mould, MD;  Location: [place];  Service: Vascular;  Laterality: Left;  . COLONOSCOPY WITH PROPOFOL N/A 03/25/2018   Procedure: COLONOSCOPY WITH PROPOFOL;  Surgeon: Lollie Sails, MD;  Location: [facility] ENDOSCOPY;  Service: Endoscopy;  Laterality: N/A;  . PARATHYROIDECTOMY    . PARATHYROIDECTOMY Left 10/21/2012   Procedure: TOTAL PARATHYROIDECTOMY WITH AUTOTRANSPLANT TO THE LEFT FOREARM;  Surgeon: Earnstine Regal, MD;  Location: [place];  Service: General;  Laterality: Left;     A IV Location/Drains/Wounds Patient Lines/Drains/Airways Status   Active Line/Drains/Airways    Name:   Placement date:   Placement time:   Site:   Days:   Peripheral IV 03/08/19 Right Hand   03/08/19    1627    Hand   less than 1   Peripheral IV 03/08/19 Right Antecubital   03/08/19    1627    Antecubital   less than 1   Fistula / Graft Right Upper arm Arteriovenous vein graft   -    -    Upper arm      Fistula / Graft Left Upper arm Arteriovenous fistula   06/10/15    0819    Upper arm   1367   Incision 10/21/12 Neck Other (Comment)   10/21/12  1341     2329   Incision 10/21/12 Arm Left   10/21/12    1433     2329   Incision (Closed) 06/10/15 Arm Left   06/10/15    0822     1367          Intake/Output Last 24 hours No intake or output data in the 24 hours ending 03/08/19 2016  Labs/Imaging Results for orders placed or performed during the hospital encounter of 03/08/19 (from the past 48 hour(s))  Comprehensive metabolic panel     Status: Abnormal   Collection Time: 03/08/19  3:53 PM  Result Value Ref Range   Sodium 132 (L) 135 - 145 mmol/L   Potassium 4.3 3.5 - 5.1 mmol/L   Chloride 91 (L) 98 - 111 mmol/L   CO2 21 (L) 22 - 32 mmol/L   Glucose, Bld 76 70 - 99 mg/dL   BUN  78 (H) 6 - 20 mg/dL   Creatinine, Ser 17.67 (H) 0.61 - 1.24 mg/dL   Calcium 7.3 (L) 8.9 - 10.3 mg/dL   Total Protein 7.3 6.5 - 8.1 g/dL   Albumin 2.8 (L) 3.5 - 5.0 g/dL   AST 101 (H) 15 - 41 U/L   ALT 37 0 - 44 U/L   Alkaline Phosphatase 20 (L) 38 - 126 U/L   Total Bilirubin 0.6 0.3 - 1.2 mg/dL   GFR calc non Af Amer 3 (L) >60 mL/min   GFR calc Af Amer 3 (L) >60 mL/min   Anion gap 20 (H) 5 - 15    Comment: Performed at [facility], [address], [place], [place] [address]  Lactic acid, plasma     Status: None   Collection Time: 03/08/19  3:53 PM  Result Value Ref Range   Lactic Acid, Venous 1.3 0.5 - 1.9 mmol/L    Comment: Performed at [facility] [address], [place], [place] [address]  CBC with Differential     Status: Abnormal   Collection Time: 03/08/19  3:53 PM  Result Value Ref Range   WBC 6.5 4.0 - 10.5 K/uL   RBC 4.24 4.22 - 5.81 MIL/uL   Hemoglobin 11.3 (L) 13.0 - 17.0 g/dL   HCT 35.0 (L) 39.0 - 52.0 %   MCV 82.5 80.0 - 100.0 fL   MCH 26.7 26.0 - 34.0 pg   MCHC 32.3 30.0 - 36.0 g/dL   RDW 14.6 11.5 - 15.5 %   Platelets 177 150 - 400 K/uL   nRBC 0.0 0.0 - 0.2 %   Neutrophils Relative % 85 %   Neutro Abs 5.5 1.7 - 7.7 K/uL   Lymphocytes Relative 9 %   Lymphs Abs 0.6 (L) 0.7 - 4.0 K/uL   Monocytes Relative 6 %   Monocytes Absolute 0.4 0.1 - 1.0 K/uL   Eosinophils Relative 0 %   Eosinophils Absolute 0.0 0.0 - 0.5 K/uL   Basophils Relative 0 %   Basophils Absolute 0.0 0.0 - 0.1 K/uL   Immature Granulocytes 0 %   Abs Immature Granulocytes 0.01 0.00 - 0.07 K/uL    Comment: Performed at [facility] [address], [place], [place] [address]  CBG monitoring, ED     Status: None   Collection Time: 03/08/19  4:00 PM  Result Value Ref Range   Glucose-Capillary 71 70 - 99 mg/dL  SARS Coronavirus 2 [facility] order, Performed in [facility] hospital lab) Nasopharyngeal Nasopharyngeal Swab     Status:  Abnormal   Collection Time: 03/08/19  4:01 PM    Specimen: Nasopharyngeal Swab  Result Value Ref Range   SARS Coronavirus 2 POSITIVE (A) NEGATIVE    Comment: CRITICAL RESULT CALLED TO, READ BACK BY AND VERIFIED WITH: RN [place], [place] [phone] FCP (NOTE) If result is NEGATIVE SARS-CoV-2 target nucleic acids are NOT DETECTED. The SARS-CoV-2 RNA is generally detectable in upper and lower  respiratory specimens during the acute phase of infection. The lowest  concentration of SARS-CoV-2 viral copies this assay can detect is 250  copies / mL. A negative result does not preclude SARS-CoV-2 infection  and should not be used as the sole basis for treatment or other  patient management decisions.  A negative result may occur with  improper specimen collection / handling, submission of specimen other  than nasopharyngeal swab, presence of viral mutation(s) within the  areas targeted by this assay, and inadequate number of viral copies  (<250 copies / mL). A negative result must be combined with clinical  observations, patient history, and epidemiological information. If result is POSITIVE SARS-CoV-2 target nucleic acids are DETECT ED. The SARS-CoV-2 RNA is generally detectable in upper and lower  respiratory specimens during the acute phase of infection.  Positive  results are indicative of active infection with SARS-CoV-2.  Clinical  correlation with patient history and other diagnostic information is  necessary to determine patient infection status.  Positive results do  not rule out bacterial infection or co-infection with other viruses. If result is PRESUMPTIVE POSTIVE SARS-CoV-2 nucleic acids MAY BE PRESENT.   A presumptive positive result was obtained on the submitted specimen  and confirmed on repeat testing.  While 2019 novel coronavirus  (SARS-CoV-2) nucleic acids may be present in the submitted sample  additional confirmatory testing may be necessary for epidemiological  and / or clinical management purposes  to  differentiate between  SARS-CoV-2 and other Sarbecovirus currently known to infect humans.  If clinically indicated additional testing with an alternate test  methodology (LAB74 [id number]) is advised. The SARS-CoV-2 RNA is generally  detectable in upper and lower respiratory specimens during the acute  phase of infection. The expected result is Negative. Fact Sheet for Patients:  StrictlyIdeas.no Fact Sheet for Healthcare Providers: BankingDealers.co.za This test is not yet approved or cleared by the [place] FDA and has been authorized for detection and/or diagnosis of SARS-CoV-2 by FDA under an Emergency Use Authorization (EUA).  This EUA will remain in effect (meaning this test can be used) for the duration of the COVID-19 declaration under Section 564(b)(1) of the Act, 21 U.S.C. section 360bbb-3(b)(1), unless the authorization is terminated or revoked sooner. Performed at [facility], [place] [address], [place], [place] [address]   Lactic acid, plasma     Status: None   Collection Time: 03/08/19  5:45 PM  Result Value Ref Range   Lactic Acid, Venous 0.6 0.5 - 1.9 mmol/L    Comment: Performed at [facility] [address] [place], [place] [address]  CBG monitoring, ED     Status: Abnormal   Collection Time: 03/08/19  6:03 PM  Result Value Ref Range   Glucose-Capillary 29 (LL) 70 - 99 mg/dL  CBG monitoring, ED     Status: Abnormal   Collection Time: 03/08/19  6:32 PM  Result Value Ref Range   Glucose-Capillary 128 (H) 70 - 99 mg/dL   Dg Chest Portable 1 View  Result Date: 03/08/2019 CLINICAL DATA:  Acute weakness and  fever. EXAM: PORTABLE CHEST 1 VIEW COMPARISON:  06/23/2015 and prior radiographs FINDINGS: The cardiomediastinal silhouette is unremarkable. There is no evidence of focal airspace disease, pulmonary edema, suspicious pulmonary nodule/mass, pleural effusion, or pneumothorax. No acute bony abnormalities are  identified. IMPRESSION: No active disease. Electronically Signed   By: Margarette Canada M.D.   On: 03/08/2019 15:53    Pending Labs Unresulted Labs (From admission, onward)    Start     Ordered   03/08/19 1904  D-dimer, quantitative  ONCE - STAT,   STAT    Comments: Used for prognosis and bed placement. Do not order CT or V/Q.    03/08/19 1904   03/08/19 1904  Procalcitonin  ONCE - STAT,   STAT     03/08/19 1904   03/08/19 1904  Lactate dehydrogenase  Once,   STAT     03/08/19 1904   03/08/19 1904  Ferritin  Once,   STAT     03/08/19 1904   03/08/19 1904  Triglycerides  Once,   STAT     03/08/19 1904   03/08/19 1904  Fibrinogen  Once,   STAT     03/08/19 1904   03/08/19 1904  C-reactive protein  Once,   STAT     03/08/19 1904   03/08/19 1530  Culture, blood (routine x 2)  BLOOD CULTURE X 2,   STAT     03/08/19 1530          Vitals/Pain Today's Vitals   03/08/19 1834 03/08/19 1845 03/08/19 1915 03/08/19 1930  BP: (!) 87/60 (!) 87/48 (!) 95/49 (!) 99/59  Pulse: 82 77 76 87  Resp: 18 15 19  (!) 21  Temp: 99.1 F (37.3 C)     TempSrc:      SpO2: 96% 99% 97% 96%  Weight:      Height:      PainSc:        Isolation Precautions Airborne and Contact precautions  Medications Medications  dextrose 10 % infusion (has no administration in time range)  acetaminophen (TYLENOL) tablet 650 mg (650 mg Oral Given 03/08/19 1655)  dextrose 50 % solution (  Given 03/08/19 1826)    Mobility walks Low fall risk   Focused Assessments Pulmonary Assessment Handoff:  Lung sounds:   O2 Device: Room Air        R Recommendations: See Admitting Provider Note  Report given to:   Additional Notes:

## 2019-03-08 NOTE — Consult Note (Signed)
[facility] Renal Consultation Note  Requesting MD: Jossie Ng, MD Indication for Consultation:  ESRD  Chief complaint: fever, malaise  HPI:  James Brown is a 53 y.o. male with a history of end-stage renal disease on hemodialysis Monday Wednesday Friday at [facility].  He last went to dialysis on Friday/9/11.  The patient presented to the ER with fever and "not feeling well."  He was found to be COVID positive.  Note that he is also been persistently hypoglycemic here.  He has required multiple dextrose boluses and was started on a continuous dextrose infusion by the ER which has been decreased from 100 to 40 an hour at our request.  He states that his blood pressure is chronically low and usually in the 90s.  He has not had respiratory distress here in the ER.  He has been satting in the high 90s on room air.  His family called EMS today twice - once with altered mental status which improved with correction of glucose per report and then with weakness.  After the second call they sent him to the ER.   Appropriate PPE including a 95 mask, gown, hair covering, shoe coverings, face shield, and and gloves were used in the examination of this patient.  Staff observed donning/doffing of PPE.  PMHx:   Past Medical History:  Diagnosis Date  . AAA (abdominal aortic aneurysm) ([place])   . Constipation   . Dermatitis   . Diabetes ([place])    boderline  . ESRD (end stage renal disease) on dialysis [facility])    started 09/2006 MWF [facility],   [facility]  . Hyperlipidemia   . Hypertension   . Tinea versicolor     Past Surgical History:  Procedure Laterality Date  . AV FISTULA PLACEMENT  06/2005   Right Arm  . AV FISTULA PLACEMENT Right 09/04/2013   Procedure: REVISION OF RIGHT BRACHIOCEPHALIC FISTULA WITH INSERTION OF ARTERIOVENOUS (AV) GORE-TEX GRAFT ARM;  Surgeon: Mal Misty, MD;  Location: [place];  Service: Vascular;  Laterality: Right;  . AV FISTULA PLACEMENT Left  06/10/2015   Procedure: ARTERIOVENOUS (AV) FISTULA CREATION LEFT UPPER ARM;  Surgeon: Angelia Mould, MD;  Location: [place];  Service: Vascular;  Laterality: Left;  . COLONOSCOPY WITH PROPOFOL N/A 03/25/2018   Procedure: COLONOSCOPY WITH PROPOFOL;  Surgeon: Lollie Sails, MD;  Location: [facility] ENDOSCOPY;  Service: Endoscopy;  Laterality: N/A;  . PARATHYROIDECTOMY    . PARATHYROIDECTOMY Left 10/21/2012   Procedure: TOTAL PARATHYROIDECTOMY WITH AUTOTRANSPLANT TO THE LEFT FOREARM;  Surgeon: Earnstine Regal, MD;  Location: [place];  Service: General;  Laterality: Left;    Family Hx:  Family History  Adopted: Yes    Social History:  reports that he has never smoked. He has never used smokeless tobacco. He reports that he does not drink alcohol or use drugs.  Allergies:  Allergies  Allergen Reactions  . Aplisol [Tuberculin] Swelling    Medications: Prior to Admission medications   Medication Sig Start Date End Date Taking? Authorizing Provider  atorvastatin (LIPITOR) 20 MG tablet Take 20 mg by mouth daily.   Yes [provider]  Calcium Acetate 667 MG TABS Take 1 tablet by mouth daily.   Yes [provider]  calcium carbonate (TUMS) 500 MG chewable tablet Chew 1 tablet by mouth 3 (three) times daily as needed for indigestion.    Yes [provider]  fenofibrate 160 MG tablet Take 160 mg by mouth daily.   Yes  [provider]  glimepiride (AMARYL) 1 MG tablet Take 2 mg by mouth daily with breakfast.   Yes [provider]  midodrine (PROAMATINE) 10 MG tablet Take 10 mg by mouth 3 (three) times daily.   Yes [provider]  simvastatin (ZOCOR) 80 MG tablet Take 80 mg by mouth.   Yes [provider]    I have reviewed the patient's current medications.  Medications have not been updated and he and family are uncertain.  Pharmacy is updating per primary team.  He does state midodrine is normally just once before HD, not TID.    Labs:  BMP Latest Ref Rng & Units 03/08/2019 03/25/2018 06/23/2015  Glucose 70 - 99 mg/dL 76 39(LL) 136(H)  BUN 6 - 20 mg/dL 78(H) 52(H) 33(H)  Creatinine 0.61 - 1.24 mg/dL 17.67(H) 12.96(H) 8.28(H)  Sodium 135 - 145 mmol/L 132(L) 138 137  Potassium 3.5 - 5.1 mmol/L 4.3 3.8 3.9  Chloride 98 - 111 mmol/L 91(L) 94(L) 96(L)  CO2 22 - 32 mmol/L 21(L) 26 24  Calcium 8.9 - 10.3 mg/dL 7.3(L) 7.7(L) 10.4(H)     ROS:  Pertinent items noted in HPI and remainder of comprehensive ROS otherwise negative.   Physical Exam: Vitals:   03/08/19 1915 03/08/19 1930  BP: (!) 95/49 (!) 99/59  Pulse: 76 87  Resp: 19 (!) 21  Temp:    SpO2: 97% 96%     General: Adult male in stretcher no acute distress HEENT: Normocephalic atraumatic Eyes: Extraocular movements intact sclera anicteric Neck: Supple trachea midline Heart: Regular rate and rhythm no rub Lungs: Clear and unlabored Abdomen: Soft nontender nondistended Extremities: No edema appreciated Skin: No rash on extremities exposed Neuro: Alert and oriented and conversant provides a history GU no Foley Access left upper extremity AV fistula in place with thrill  Dialysis orders [facility] Monday Wednesday Friday 4 hours 450 blood flow, dialysate auto flow 1.5 Estimated dry weight 99.5 kg 2K/3.5 calcium bath AV fistula 10,000 unit bolus of heparin every treatment Mostly reaches his dry weight Status post Mircera 50 on 9/2; this now appears off.  Hemoglobin 11.8 on 9/9.   Outpatient dialysis unit pressures of 90s to 130s   Assessment/Plan:  # End-stage renal disease - Hemodialysis per Monday Wednesday Friday schedule  # COVID positive - CXR with no evidence of airspace dz - Treatments per primary team - Personal protective equipment per protocol  # Hypoglycemia - Would reduce dextrose infusion to 40 mils an hour of continuous infusion is needed - Blood glucose monitoring per primary team  # Chronic hypotension   - Noted  Midodrine per home meds - Patient states takes 10 mg midodrine before HD  - Spoke with primary team   # Fever - Covid positive - noted blood culture in process   # Anemia of chronic disease - s/p mircera above as an outpatient on 9/2  - Follow trends   # Hypocalcemia - improves with correction for albumin - High calcium bath  - On calcium supplement    Claudia Desanctis 03/08/2019, 8:41 PM

## 2019-03-08 NOTE — ED Notes (Signed)
Attempted to call report.  Nurse unavailable. 

## 2019-03-08 NOTE — ED Triage Notes (Signed)
[facility]- pt here for evaluation of hypoglycemia and hypotension. Pt is a diabetic but currently working on trying to get meds for his diabetes. When EMS arrived pts sugar was 66. EMS gave about 100 mL of D10 through an IV and gave 100 mL of NS for blood pressure.

## 2019-03-08 NOTE — Progress Notes (Signed)
Dr.Blount msg sent to call me about pt's orders and recent Accu Check

## 2019-03-08 NOTE — ED Notes (Signed)
CBG 124 

## 2019-03-08 NOTE — ED Notes (Signed)
CBG 71. RN notified.

## 2019-03-09 ENCOUNTER — Inpatient Hospital Stay (HOSPITAL_COMMUNITY): Payer: Medicare Other

## 2019-03-09 DIAGNOSIS — J1289 Other viral pneumonia: Secondary | ICD-10-CM

## 2019-03-09 DIAGNOSIS — J9601 Acute respiratory failure with hypoxia: Secondary | ICD-10-CM

## 2019-03-09 DIAGNOSIS — E782 Mixed hyperlipidemia: Secondary | ICD-10-CM

## 2019-03-09 DIAGNOSIS — I10 Essential (primary) hypertension: Secondary | ICD-10-CM

## 2019-03-09 DIAGNOSIS — Z992 Dependence on renal dialysis: Secondary | ICD-10-CM

## 2019-03-09 DIAGNOSIS — U071 COVID-19: Secondary | ICD-10-CM

## 2019-03-09 DIAGNOSIS — J9690 Respiratory failure, unspecified, unspecified whether with hypoxia or hypercapnia: Secondary | ICD-10-CM

## 2019-03-09 DIAGNOSIS — E1122 Type 2 diabetes mellitus with diabetic chronic kidney disease: Secondary | ICD-10-CM

## 2019-03-09 DIAGNOSIS — N186 End stage renal disease: Secondary | ICD-10-CM

## 2019-03-09 LAB — COMPREHENSIVE METABOLIC PANEL
ALT: 38 U/L (ref 0–44)
AST: 105 U/L — ABNORMAL HIGH (ref 15–41)
Albumin: 2.5 g/dL — ABNORMAL LOW (ref 3.5–5.0)
Alkaline Phosphatase: 20 U/L — ABNORMAL LOW (ref 38–126)
Anion gap: 21 — ABNORMAL HIGH (ref 5–15)
BUN: 84 mg/dL — ABNORMAL HIGH (ref 6–20)
CO2: 19 mmol/L — ABNORMAL LOW (ref 22–32)
Calcium: 6.5 mg/dL — ABNORMAL LOW (ref 8.9–10.3)
Chloride: 87 mmol/L — ABNORMAL LOW (ref 98–111)
Creatinine, Ser: 18.73 mg/dL — ABNORMAL HIGH (ref 0.61–1.24)
GFR calc Af Amer: 3 mL/min — ABNORMAL LOW (ref >60)
GFR calc non Af Amer: 3 mL/min — ABNORMAL LOW (ref >60)
Glucose, Bld: 160 mg/dL — ABNORMAL HIGH (ref 70–99)
Potassium: 4.7 mmol/L (ref 3.5–5.1)
Sodium: 127 mmol/L — ABNORMAL LOW (ref 135–145)
Total Bilirubin: 0.8 mg/dL (ref 0.3–1.2)
Total Protein: 6.5 g/dL (ref 6.5–8.1)

## 2019-03-09 LAB — GLUCOSE, CAPILLARY
Glucose-Capillary: 104 mg/dL — ABNORMAL HIGH (ref 70–99)
Glucose-Capillary: 116 mg/dL — ABNORMAL HIGH (ref 70–99)
Glucose-Capillary: 124 mg/dL — ABNORMAL HIGH (ref 70–99)
Glucose-Capillary: 143 mg/dL — ABNORMAL HIGH (ref 70–99)
Glucose-Capillary: 174 mg/dL — ABNORMAL HIGH (ref 70–99)
Glucose-Capillary: 235 mg/dL — ABNORMAL HIGH (ref 70–99)
Glucose-Capillary: 239 mg/dL — ABNORMAL HIGH (ref 70–99)
Glucose-Capillary: 240 mg/dL — ABNORMAL HIGH (ref 70–99)
Glucose-Capillary: 35 mg/dL — CL (ref 70–99)
Glucose-Capillary: 36 mg/dL — CL (ref 70–99)
Glucose-Capillary: 49 mg/dL — ABNORMAL LOW (ref 70–99)
Glucose-Capillary: 67 mg/dL — ABNORMAL LOW (ref 70–99)
Glucose-Capillary: 78 mg/dL (ref 70–99)
Glucose-Capillary: 82 mg/dL (ref 70–99)
Glucose-Capillary: 90 mg/dL (ref 70–99)

## 2019-03-09 LAB — CBC WITH DIFFERENTIAL/PLATELET
Abs Immature Granulocytes: 0.02 10*3/uL (ref 0.00–0.07)
Basophils Absolute: 0 10*3/uL (ref 0.0–0.1)
Basophils Relative: 0 %
Eosinophils Absolute: 0 10*3/uL (ref 0.0–0.5)
Eosinophils Relative: 0 %
HCT: 30.8 % — ABNORMAL LOW (ref 39.0–52.0)
Hemoglobin: 10.2 g/dL — ABNORMAL LOW (ref 13.0–17.0)
Immature Granulocytes: 0 %
Lymphocytes Relative: 8 %
Lymphs Abs: 0.5 10*3/uL — ABNORMAL LOW (ref 0.7–4.0)
MCH: 26.4 pg (ref 26.0–34.0)
MCHC: 33.1 g/dL (ref 30.0–36.0)
MCV: 79.8 fL — ABNORMAL LOW (ref 80.0–100.0)
Monocytes Absolute: 0.3 10*3/uL (ref 0.1–1.0)
Monocytes Relative: 5 %
Neutro Abs: 5.3 10*3/uL (ref 1.7–7.7)
Neutrophils Relative %: 87 %
Platelets: 174 10*3/uL (ref 150–400)
RBC: 3.86 MIL/uL — ABNORMAL LOW (ref 4.22–5.81)
RDW: 14.3 % (ref 11.5–15.5)
WBC: 6.2 10*3/uL (ref 4.0–10.5)
nRBC: 0 % (ref 0.0–0.2)

## 2019-03-09 LAB — ABO/RH: ABO/RH(D): A POS

## 2019-03-09 LAB — HIV ANTIBODY (ROUTINE TESTING W REFLEX): HIV Screen 4th Generation wRfx: NONREACTIVE

## 2019-03-09 MED ORDER — LIDOCAINE-PRILOCAINE 2.5-2.5 % EX CREA: 1.0000 "application " | TOPICAL_CREAM | CUTANEOUS | Status: DC | PRN

## 2019-03-09 MED ORDER — DEXTROSE 50 % IV SOLN
INTRAVENOUS | Status: AC
  Administered 2019-03-09: 50 mL via INTRAVENOUS
  Filled 2019-03-09: qty 50

## 2019-03-09 MED ORDER — ACETAMINOPHEN 500 MG PO TABS
1000.0000 mg | ORAL_TABLET | Freq: Once | ORAL | Status: AC
  Administered 2019-03-09: 1000 mg via ORAL
  Filled 2019-03-09: qty 2

## 2019-03-09 MED ORDER — GUAIFENESIN-DM 100-10 MG/5ML PO SYRP
10.0000 mL | ORAL_SOLUTION | ORAL | Status: DC | PRN
  Administered 2019-03-09: 10 mL via ORAL
  Filled 2019-03-09: qty 10

## 2019-03-09 MED ORDER — PENTAFLUOROPROP-TETRAFLUOROETH EX AERO: 1.0000 "application " | INHALATION_SPRAY | CUTANEOUS | Status: DC | PRN

## 2019-03-09 MED ORDER — SODIUM CHLORIDE 0.9 % IV SOLN: 100.0000 mL | INTRAVENOUS | Status: DC | PRN

## 2019-03-09 MED ORDER — DEXAMETHASONE SODIUM PHOSPHATE 10 MG/ML IJ SOLN
6.0000 mg | Freq: Every day | INTRAMUSCULAR | Status: DC
  Administered 2019-03-09 – 2019-03-13 (×5): 6 mg via INTRAVENOUS
  Filled 2019-03-09 (×5): qty 1

## 2019-03-09 MED ORDER — HYDROCOD POLST-CPM POLST ER 10-8 MG/5ML PO SUER: 5.0000 mL | Freq: Two times a day (BID) | ORAL | Status: DC | PRN

## 2019-03-09 MED ORDER — CALCIUM CARBONATE ANTACID 500 MG PO CHEW: 1.0000 | CHEWABLE_TABLET | Freq: Three times a day (TID) | ORAL | Status: DC | PRN

## 2019-03-09 MED ORDER — ATORVASTATIN CALCIUM 40 MG PO TABS: 40.0000 mg | ORAL_TABLET | Freq: Every day | ORAL | Status: DC

## 2019-03-09 MED ORDER — MIDODRINE HCL 5 MG PO TABS
10.0000 mg | ORAL_TABLET | Freq: Three times a day (TID) | ORAL | Status: DC
  Administered 2019-03-09 – 2019-03-13 (×13): 10 mg via ORAL
  Filled 2019-03-09 (×13): qty 2

## 2019-03-09 MED ORDER — HEPARIN SODIUM (PORCINE) 5000 UNIT/ML IJ SOLN
5000.0000 [IU] | Freq: Three times a day (TID) | INTRAMUSCULAR | Status: DC
  Administered 2019-03-09 – 2019-03-13 (×11): 5000 [IU] via SUBCUTANEOUS
  Filled 2019-03-09 (×11): qty 1

## 2019-03-09 MED ORDER — CALCIUM ACETATE (PHOS BINDER) 667 MG PO CAPS
667.0000 mg | ORAL_CAPSULE | Freq: Every day | ORAL | Status: DC
  Administered 2019-03-09 – 2019-03-12 (×4): 667 mg via ORAL
  Filled 2019-03-09 (×4): qty 1

## 2019-03-09 MED ORDER — ALTEPLASE 2 MG IJ SOLR: 2.0000 mg | Freq: Once | INTRAMUSCULAR | Status: DC | PRN

## 2019-03-09 MED ORDER — ONDANSETRON HCL 4 MG/2ML IJ SOLN: 4.0000 mg | Freq: Four times a day (QID) | INTRAMUSCULAR | Status: DC | PRN

## 2019-03-09 MED ORDER — SODIUM CHLORIDE 0.9 % IV SOLN
200.0000 mg | Freq: Once | INTRAVENOUS | Status: AC
  Administered 2019-03-09: 200 mg via INTRAVENOUS
  Filled 2019-03-09: qty 40

## 2019-03-09 MED ORDER — LIDOCAINE HCL (PF) 1 % IJ SOLN: 5.0000 mL | INTRAMUSCULAR | Status: DC | PRN

## 2019-03-09 MED ORDER — SODIUM CHLORIDE 0.9 % IV SOLN
100.0000 mg | INTRAVENOUS | Status: DC
  Administered 2019-03-10 – 2019-03-12 (×3): 100 mg via INTRAVENOUS
  Filled 2019-03-09 (×5): qty 20

## 2019-03-09 MED ORDER — DEXTROSE 50 % IV SOLN
50.0000 mL | Freq: Once | INTRAVENOUS | Status: AC
  Administered 2019-03-09: 50 mL via INTRAVENOUS

## 2019-03-09 MED ORDER — ACETAMINOPHEN 325 MG PO TABS
650.0000 mg | ORAL_TABLET | Freq: Once | ORAL | Status: AC
  Administered 2019-03-09: 650 mg via ORAL
  Filled 2019-03-09: qty 2

## 2019-03-09 MED ORDER — DEXTROSE 50 % IV SOLN
INTRAVENOUS | Status: AC
  Administered 2019-03-09: 06:00:00
  Filled 2019-03-09: qty 50

## 2019-03-09 MED ORDER — DEXTROSE 50 % IV SOLN
INTRAVENOUS | Status: AC
  Administered 2019-03-09: 03:00:00
  Filled 2019-03-09: qty 50

## 2019-03-09 MED ORDER — ATORVASTATIN CALCIUM 10 MG PO TABS
20.0000 mg | ORAL_TABLET | Freq: Every day | ORAL | Status: DC
  Administered 2019-03-09 – 2019-03-13 (×5): 20 mg via ORAL
  Filled 2019-03-09 (×5): qty 2

## 2019-03-09 MED ORDER — DEXTROSE 10 % IV SOLN: INTRAVENOUS | Status: AC

## 2019-03-09 MED ORDER — ONDANSETRON HCL 4 MG PO TABS: 4.0000 mg | ORAL_TABLET | Freq: Four times a day (QID) | ORAL | Status: DC | PRN

## 2019-03-09 MED ORDER — FENOFIBRATE 160 MG PO TABS
160.0000 mg | ORAL_TABLET | Freq: Every day | ORAL | Status: DC
  Administered 2019-03-09 – 2019-03-13 (×5): 160 mg via ORAL
  Filled 2019-03-09 (×5): qty 1

## 2019-03-09 MED ORDER — INSULIN ASPART 100 UNIT/ML ~~LOC~~ SOLN
0.0000 [IU] | Freq: Three times a day (TID) | SUBCUTANEOUS | Status: DC
  Administered 2019-03-10: 2 [IU] via SUBCUTANEOUS
  Administered 2019-03-10: 7 [IU] via SUBCUTANEOUS
  Administered 2019-03-10: 3 [IU] via SUBCUTANEOUS
  Administered 2019-03-11: 5 [IU] via SUBCUTANEOUS
  Administered 2019-03-11: 2 [IU] via SUBCUTANEOUS
  Administered 2019-03-11: 3 [IU] via SUBCUTANEOUS
  Administered 2019-03-12: 5 [IU] via SUBCUTANEOUS
  Administered 2019-03-12: 1 [IU] via SUBCUTANEOUS
  Administered 2019-03-12: 9 [IU] via SUBCUTANEOUS
  Administered 2019-03-13: 3 [IU] via SUBCUTANEOUS

## 2019-03-09 MED ORDER — SODIUM CHLORIDE 0.9 % IV BOLUS
500.0000 mL | Freq: Once | INTRAVENOUS | Status: AC
  Administered 2019-03-09: 500 mL via INTRAVENOUS

## 2019-03-09 MED ORDER — HEPARIN SODIUM (PORCINE) 1000 UNIT/ML DIALYSIS
1000.0000 [IU] | INTRAMUSCULAR | Status: DC | PRN
  Administered 2019-03-11: 1000 [IU] via INTRAVENOUS_CENTRAL

## 2019-03-09 NOTE — Progress Notes (Signed)
PROGRESS NOTE    James Brown  EYC:[id number] DOB: 17-Sep-1965 DOA: 03/08/2019 PCP: Mauricia Area, MD    Brief Narrative:  53 year old male who presented with weakness and fever.  He does have significant past medical history for end-stage renal disease on hemodialysis, MWF, abdominal aortic aneurysm, type 2 diabetes mellitus, hypertension and dyslipidemia.  He was brought by EMS due to altered mentation.  Positive generalized malaise, fevers and chills.  When EMS arrived his capillary glucose was 60.  On his initial physical examination his temperature was 100.9, blood pressure 85/58, heart rate 89-108, respiratory rate 21-31, oxygen saturation 91-92-96%.  He had decreased air entry bilaterally with positive Rales at bases, heart S1-S2 present and rhythmic, the abdomen was soft nontender, no lower extremity edema. Sodium 132, potassium 4.3, chloride 91, bicarb 21, glucose 76, BUN 78, creatinine 17, ferritin 3 655, CRP 8.7, procalcitonin 3, white cell count 6.5, hemoglobin 11.3, hematocrit 35.0, platelets 177, fibrinogen 540, d-dimer 1.2.  SARS COVID-19 positive.  His chest radiograph had increased interstitial markings at bases predominantly on the left.  EKG 97 bpm, left axis deviation, QTC 450, sinus rhythm, no ST segment or T wave changes, poor R wave progression and low voltage.   Patient was admitted to the hospital with a working diagnosis of sepsis due to SARS COVID-19 infection  Assessment & Plan:   Principal Problem:   Pneumonia due to COVID-19 virus Active Problems:   Hyperlipidemia, mixed   Essential hypertension   ESRD   DM (diabetes mellitus) type II controlled with renal manifestation ([place])   COVID-19 virus infection   Hypoglycemia   Respiratory failure ([place])   1.  Acute hypoxic respiratory failure due to SARS COVID-19 pneumonia. Patient's oxygen saturation on admission was 91% on room air, positive dyspnea, follow up chest film with bilateral interstitial  infiltrates left upper and bilateral lower lobes, personally reviewed. Patient with persistent fever. Will continue oxymetry monitoring and will place patient on dexamethasone and remdesivir. Fluid management per HD. Follow with inflammatory markers.   2. T2DM with hypoglycemia. Fasting glucose this am is 160. Patient will be placed on systemic steroids and will need insulin therapy, will continue glucose monitoring. Patient is tolerating po well.  3. ESRD on HD with hyponatremia. Na this am is 127, no signs of volume overload, will continue renal replacement therapy while hospitalized. Continue calcium acetate and carbonate. Patient on midodrine TID.   4. Hypotension/ chronic. Will continue blood pressure monitoring, blood pressure this am 104/75 mmhg.  Continue with midodrine.   5. Dyslipidemia. Will continue atorvastatin and fenofibrate.    6. Obesity. Calculated BMI is 35,3   DVT prophylaxis: heparin   Code Status: full Family Communication: no family at the bedside  Disposition Plan/ discharge barriers: pending clinical improvement.   Body mass index is 35.32 kg/m. Malnutrition Type:      Malnutrition Characteristics:      Nutrition Interventions:     RN Pressure Injury Documentation:     Consultants:   Nephrology   Procedures:     Antimicrobials:       Subjective: Patient continue to be febrile, positive dyspnea at home and on admission, no nausea or vomiting.   Objective: Vitals:   03/09/19 0525 03/09/19 0600 03/09/19 0617 03/09/19 0734  BP:      Pulse:      Resp:  (!) 24    Temp: (!) 102.6 F (39.2 C)  (!) 100.8 F (38.2 C) (!) 100.8 F (38.2 C)  TempSrc: Oral  Oral Oral  SpO2:    99%  Weight:      Height:        Intake/Output Summary (Last 24 hours) at 03/09/2019 0818 Last data filed at 03/09/2019 8676 Gross per 24 hour  Intake 995 ml  Output -  Net 995 ml   Filed Weights   03/08/19 1435 03/08/19 2215  Weight: 99.8 kg 102.3 kg     Examination:   General: deconditioned  Neurology: Awake and alert, non focal  E ENT: positive pallor, no icterus, oral mucosa moist Cardiovascular: No JVD. S1-S2 present, rhythmic, no gallops, rubs, or murmurs. No lower extremity edema. Pulmonary: positive breath sounds bilaterally, with decreased breath sound, with no wheezing, rhonchi or rales. Gastrointestinal. Abdomen with no organomegaly, non tender, no rebound or guarding Skin. No rashes Musculoskeletal: no joint deformities     Data Reviewed: I have personally reviewed following labs and imaging studies  CBC: Recent Labs  Lab 03/08/19 1553 03/09/19 0545  WBC 6.5 6.2  NEUTROABS 5.5 5.3  HGB 11.3* 10.2*  HCT 35.0* 30.8*  MCV 82.5 79.8*  PLT 177 195   Basic Metabolic Panel: Recent Labs  Lab 03/08/19 1553 03/09/19 0545  NA 132* 127*  K 4.3 4.7  CL 91* 87*  CO2 21* 19*  GLUCOSE 76 160*  BUN 78* 84*  CREATININE 17.67* 18.73*  CALCIUM 7.3* 6.5*   GFR: Estimated Creatinine Clearance: 5.3 mL/min (A) (by C-G formula based on SCr of 18.73 mg/dL (H)). Liver Function Tests: Recent Labs  Lab 03/08/19 1553 03/09/19 0545  AST 101* 105*  ALT 37 38  ALKPHOS 20* 20*  BILITOT 0.6 0.8  PROT 7.3 6.5  ALBUMIN 2.8* 2.5*   No results for input(s): LIPASE, AMYLASE in the last 168 hours. No results for input(s): AMMONIA in the last 168 hours. Coagulation Profile: No results for input(s): INR, PROTIME in the last 168 hours. Cardiac Enzymes: No results for input(s): CKTOTAL, CKMB, CKMBINDEX, TROPONINI in the last 168 hours. BNP (last 3 results) No results for input(s): PROBNP in the last 8760 hours. HbA1C: No results for input(s): HGBA1C in the last 72 hours. CBG: Recent Labs  Lab 03/09/19 0227 03/09/19 0336 03/09/19 0528 03/09/19 0634 03/09/19 0733  GLUCAP 36* 104* 49* 116* 90   Lipid Profile: Recent Labs    03/08/19 2026  TRIG 197*   Thyroid Function Tests: No results for input(s): TSH, T4TOTAL, FREET4,  T3FREE, THYROIDAB in the last 72 hours. Anemia Panel: Recent Labs    03/08/19 2026  FERRITIN 3,655*      Radiology Studies: I have reviewed all of the imaging during this hospital visit personally     Scheduled Meds: . atorvastatin  20 mg Oral Daily  . calcium acetate  667 mg Oral QAC supper  . Chlorhexidine Gluconate Cloth  6 each Topical Q0600  . fenofibrate  160 mg Oral Daily  . heparin  5,000 Units Subcutaneous Q8H  . insulin aspart  0-9 Units Subcutaneous TID WC  . midodrine  10 mg Oral TID WC   Continuous Infusions: . sodium chloride    . sodium chloride    . dextrose 75 mL/hr at 03/09/19 0932     LOS: 1 day        Orlando Thalmann Gerome Apley, MD

## 2019-03-09 NOTE — Progress Notes (Signed)
Renal Navigator notes positive COVID 19 test result. Patient will need to receive outpatient HD at [facility] isolation clinic at discharge, which is a TTS schedule with a seat time of 12:00pm. Patient needs to arrive at 11:45am to the side door of the parking lot and wait to be called in by staff. Renal Navigator will discuss this with patient near discharge.  Renal Navigator has notified patient's home clinic/SW and [facility].  [name], [place] Renal Navigator [phone]

## 2019-03-09 NOTE — Progress Notes (Addendum)
Hypoglycemic Event  CBG: 35, repeat 174, 1800 CBG 143  Treatment: D50 50 mL (25 gm) amp d50 followed by ginger ale and crackers, supper ordered  Symptoms: Pale and Shaky lethargic  Follow-up CBG: Time:1700 CBG Result:174,143  Possible Reasons for Event: Change in activity npo during dialysis  Comments/MD notified: MD notified 1719, follow protocol    Gerrit Halls

## 2019-03-09 NOTE — Progress Notes (Signed)
Received page regarding pt MEWS score being in red d/t elevated HR and a temperature of 102.3. blood cultures x2 and chest xray ordered on 09/13. Lactic acid and CBC unremarkable at this time. Pt is positive for COVID-19 which is most likely source of fever. Tylenol ordered and given. Continue to monitor for chest pain/discomfort, SOB, AMS.   Lovey Newcomer, NP [facility] 7p-7a [phone]

## 2019-03-09 NOTE — Progress Notes (Addendum)
[facility] Progress Note  Subjective:  Patient seen in room, no distress, on HD now. No SOB, +cough   Vitals:   03/09/19 0525 03/09/19 0600 03/09/19 0617 03/09/19 0734  BP:      Pulse:      Resp:  (!) 24    Temp: (!) 102.6 F (39.2 C)  (!) 100.8 F (38.2 C) (!) 100.8 F (38.2 C)  TempSrc: Oral  Oral Oral  SpO2:    99%  Weight:      Height:        Inpatient medications: . atorvastatin  20 mg Oral Daily  . calcium acetate  667 mg Oral QAC supper  . Chlorhexidine Gluconate Cloth  6 each Topical Q0600  . dexamethasone (DECADRON) injection  6 mg Intravenous Daily  . fenofibrate  160 mg Oral Daily  . heparin  5,000 Units Subcutaneous Q8H  . insulin aspart  0-9 Units Subcutaneous TID WC  . midodrine  10 mg Oral TID WC   . sodium chloride    . sodium chloride    . remdesivir 200 mg in NS 250 mL     Followed by  . [START ON 03/10/2019] remdesivir 100 mg in NS 250 mL     sodium chloride, sodium chloride, alteplase, calcium carbonate, chlorpheniramine-HYDROcodone, guaiFENesin-dextromethorphan, heparin, lidocaine (PF), lidocaine-prilocaine, ondansetron **OR** ondansetron (ZOFRAN) IV, pentafluoroprop-tetrafluoroeth    Exam:  Gen heavy set male w/o distress, O x 3  + jvd  Chest occ rhonchi, no rales or wheezing   Cor reg no mrg   Abd soft ntnd   Ext nonpitting 1+ pretib edema   Ox 3   Home meds:  - midodrine 10 tid  - simvastatin 40 qd/ fenofibrate 160 qd  - glimepiride 2mg  qam  - calc acetate ac tid   Dialysis: AF MWF (will be COVID shift TTS 12pm G-O at dc)  4h   450/ 1.5  2K/3.5Ca bath  99.5kg  AVF   Hep 10000  mircera 50 on 9/2, now off, Hb 11.8 9/9   Assessment/ Plan: # End-stage renal disease - HD MWF. HD today, up 5-6kg by wts. UF 3-4 L.   # COVID positive - CXR with no evidence of airspace dz - Treatments per primary team  # Hypoglycemia - Would reduce dextrose infusion to 40 mils an hour if continuous infusion is needed  # Chronic  hypotension - takes midodrine 10 tid at home - cont midodrine here  # Volume - up 2-3kg by wts  # Fever - Covid positive - noted blood culture in process   # Anemia of chronic disease - s/p mircera above as an outpatient on 9/2  - Follow trends   # Hypocalcemia - improves with correction for albumin - High calcium bath  - On calcium supplement     Kelly Splinter, MD 03/09/2019, 12:03 PM    Iron/TIBC/Ferritin/ %Sat    Component Value Date/Time   FERRITIN 3,655 (H) 03/08/2019 2026   Recent Labs  Lab 03/09/19 0545  NA 127*  K 4.7  CL 87*  CO2 19*  GLUCOSE 160*  BUN 84*  CREATININE 18.73*  CALCIUM 6.5*  ALBUMIN 2.5*   Recent Labs  Lab 03/09/19 0545  AST 105*  ALT 38  ALKPHOS 20*  BILITOT 0.8  PROT 6.5   Recent Labs  Lab 03/09/19 0545  WBC 6.2  HGB 10.2*  HCT 30.8*  PLT 174

## 2019-03-09 NOTE — Progress Notes (Signed)
Dr.Blount called to respond to [facility] sent

## 2019-03-09 NOTE — Progress Notes (Signed)
Dr. Kennon Holter msg sent 2 hours after accu check bld suars 30's and 40's

## 2019-03-09 NOTE — Progress Notes (Signed)
CN Producer, television/film/video given updates on Temp and recent Accu Check and that HD stated they will take him last. Breakfast ordered for pt.

## 2019-03-09 NOTE — Progress Notes (Signed)
Pt started on oxygen 2 L/M [place] for c/o SOB. Oxygen sat on Rm Air 94%

## 2019-03-09 NOTE — Progress Notes (Signed)
Dr.Blount msg sent MEWS red r/t temp 102.3 and HR 114. Tylenol given per orders

## 2019-03-10 LAB — COMPREHENSIVE METABOLIC PANEL
ALT: 45 U/L — ABNORMAL HIGH (ref 0–44)
AST: 124 U/L — ABNORMAL HIGH (ref 15–41)
Albumin: 2.8 g/dL — ABNORMAL LOW (ref 3.5–5.0)
Alkaline Phosphatase: 24 U/L — ABNORMAL LOW (ref 38–126)
Anion gap: 22 — ABNORMAL HIGH (ref 5–15)
BUN: 65 mg/dL — ABNORMAL HIGH (ref 6–20)
CO2: 19 mmol/L — ABNORMAL LOW (ref 22–32)
Calcium: 8.6 mg/dL — ABNORMAL LOW (ref 8.9–10.3)
Chloride: 89 mmol/L — ABNORMAL LOW (ref 98–111)
Creatinine, Ser: 15.15 mg/dL — ABNORMAL HIGH (ref 0.61–1.24)
GFR calc Af Amer: 4 mL/min — ABNORMAL LOW (ref >60)
GFR calc non Af Amer: 3 mL/min — ABNORMAL LOW (ref >60)
Glucose, Bld: 215 mg/dL — ABNORMAL HIGH (ref 70–99)
Potassium: 5.3 mmol/L — ABNORMAL HIGH (ref 3.5–5.1)
Sodium: 130 mmol/L — ABNORMAL LOW (ref 135–145)
Total Bilirubin: 0.8 mg/dL (ref 0.3–1.2)
Total Protein: 7.8 g/dL (ref 6.5–8.1)

## 2019-03-10 LAB — CBC WITH DIFFERENTIAL/PLATELET
Abs Immature Granulocytes: 0.05 10*3/uL (ref 0.00–0.07)
Basophils Absolute: 0 10*3/uL (ref 0.0–0.1)
Basophils Relative: 0 %
Eosinophils Absolute: 0 10*3/uL (ref 0.0–0.5)
Eosinophils Relative: 0 %
HCT: 35.9 % — ABNORMAL LOW (ref 39.0–52.0)
Hemoglobin: 12.2 g/dL — ABNORMAL LOW (ref 13.0–17.0)
Immature Granulocytes: 1 %
Lymphocytes Relative: 13 %
Lymphs Abs: 1.1 10*3/uL (ref 0.7–4.0)
MCH: 27.2 pg (ref 26.0–34.0)
MCHC: 34 g/dL (ref 30.0–36.0)
MCV: 80.1 fL (ref 80.0–100.0)
Monocytes Absolute: 0.5 10*3/uL (ref 0.1–1.0)
Monocytes Relative: 6 %
Neutro Abs: 6.7 10*3/uL (ref 1.7–7.7)
Neutrophils Relative %: 80 %
Platelets: 269 10*3/uL (ref 150–400)
RBC: 4.48 MIL/uL (ref 4.22–5.81)
RDW: 14.6 % (ref 11.5–15.5)
WBC: 8.3 10*3/uL (ref 4.0–10.5)
nRBC: 0.2 % (ref 0.0–0.2)

## 2019-03-10 LAB — GLUCOSE, CAPILLARY
Glucose-Capillary: 195 mg/dL — ABNORMAL HIGH (ref 70–99)
Glucose-Capillary: 200 mg/dL — ABNORMAL HIGH (ref 70–99)
Glucose-Capillary: 234 mg/dL — ABNORMAL HIGH (ref 70–99)
Glucose-Capillary: 245 mg/dL — ABNORMAL HIGH (ref 70–99)
Glucose-Capillary: 308 mg/dL — ABNORMAL HIGH (ref 70–99)
Glucose-Capillary: 330 mg/dL — ABNORMAL HIGH (ref 70–99)

## 2019-03-10 LAB — C-REACTIVE PROTEIN: CRP: 22.6 mg/dL — ABNORMAL HIGH (ref <1.0)

## 2019-03-10 LAB — D-DIMER, QUANTITATIVE: D-Dimer, Quant: 1.88 ug/mL-FEU — ABNORMAL HIGH (ref 0.00–0.50)

## 2019-03-10 LAB — FERRITIN: Ferritin: 5495 ng/mL — ABNORMAL HIGH (ref 24–336)

## 2019-03-10 LAB — HEMOGLOBIN A1C
Hgb A1c MFr Bld: 6.2 % — ABNORMAL HIGH (ref 4.8–5.6)
Mean Plasma Glucose: 131 mg/dL

## 2019-03-10 LAB — HEPATITIS B SURFACE ANTIGEN: Hepatitis B Surface Ag: NEGATIVE

## 2019-03-10 MED ORDER — CHLORHEXIDINE GLUCONATE CLOTH 2 % EX PADS
6.0000 | MEDICATED_PAD | Freq: Every day | CUTANEOUS | Status: DC
  Administered 2019-03-12 – 2019-03-13 (×2): 6 via TOPICAL

## 2019-03-10 MED ORDER — INSULIN ASPART 100 UNIT/ML ~~LOC~~ SOLN
6.0000 [IU] | Freq: Once | SUBCUTANEOUS | Status: AC
  Administered 2019-03-10: 6 [IU] via SUBCUTANEOUS

## 2019-03-10 NOTE — Progress Notes (Signed)
PROGRESS NOTE    James Brown  SJG:[id number] DOB: 08-Jun-1966 DOA: 03/08/2019 PCP: Mauricia Area, MD    Brief Narrative:  53 year old male who presented with weakness and fever.  He does have significant past medical history for end-stage renal disease on hemodialysis, MWF, abdominal aortic aneurysm, type 2 diabetes mellitus, hypertension and dyslipidemia.  He was brought by EMS due to altered mentation.  Positive generalized malaise, fevers and chills.  When EMS arrived his capillary glucose was 60.  On his initial physical examination his temperature was 100.9, blood pressure 85/58, heart rate 89-108, respiratory rate 21-31, oxygen saturation 91-92-96%.  He had decreased air entry bilaterally with positive Rales at bases, heart S1-S2 present and rhythmic, the abdomen was soft nontender, no lower extremity edema. Sodium 132, potassium 4.3, chloride 91, bicarb 21, glucose 76, BUN 78, creatinine 17, ferritin 3 655, CRP 8.7, procalcitonin 3, white cell count 6.5, hemoglobin 11.3, hematocrit 35.0, platelets 177, fibrinogen 540, d-dimer 1.2.  SARS COVID-19 positive.  His chest radiograph had increased interstitial markings at bases predominantly on the left.  EKG 97 bpm, left axis deviation, QTC 450, sinus rhythm, no ST segment or T wave changes, poor R wave progression and low voltage.   Patient was admitted to the hospital with a working diagnosis of sepsis due to SARS COVID-19 infection/ viral pneumonia.  Clinically patient has been improving, he is high risk for ARDS due to COVID 19, will plan to complete 5 days of Remdesivir and dexamethasone before dc.   Assessment & Plan:   Principal Problem:   Pneumonia due to COVID-19 virus Active Problems:   Hyperlipidemia, mixed   Essential hypertension   ESRD   DM (diabetes mellitus) type II controlled with renal manifestation ([place])   COVID-19 virus infection   Hypoglycemia   Respiratory failure ([place])    1.  Acute hypoxic  respiratory failure due to SARS COVID-19 viral pneumonia. On room air oxygenation is 94% on room air, dyspnea has improved, but rising inflammatory markers. Ferritin 3,655 to 5,495, CRP 8,7 to 22,6, d dimer from 1,22 to 1,88. Will continue with close oxymetry monitoring, IV Remdesivir and dexamethasone. Continue to follow up on inflammatory markers.   2. T2DM with hypoglycemia. Patient on on systemic steroids, fasting glucose 215, will continue insulin sliding scale for glucose cover and monitoring. Patient is tolerating po well.  3. ESRD on HD with hyponatremia. Improved Na at 130, clinically euvilemic, will continue to renal replacement therapy while hospitalized.  4. Hypotension/ chronic. Systolic blood pressure 94 to  88 with MAP more than 65, will continue with midodrine per home regimen.   5. Dyslipidemia. On atorvastatin and fenofibrate.    6. Obesity.  BMI is 35,3   DVT prophylaxis: heparin sq  Code Status: full Family Communication: no family at the bedside  Disposition Plan/ discharge barriers: pending clinical improvement.       Body mass index is 34.67 kg/m. Malnutrition Type:      Malnutrition Characteristics:      Nutrition Interventions:     RN Pressure Injury Documentation:     Consultants:   Nephrology   Procedures:     Antimicrobials:   Remdesivir     Subjective: Patient had improved fever along with dyspnea, no nausea or vomiting, tolerated HD well yesterday. No nausea or vomiting.   Objective: Vitals:   03/09/19 1515 03/09/19 1526 03/09/19 2255 03/10/19 0904  BP: (!) 121/53 131/61 94/61 (!) 88/63  Pulse: (!) 107 (!) 107  86  Resp:  20 14 17   Temp:  99.9 F (37.7 C) 99.9 F (37.7 C) 99.2 F (37.3 C)  TempSrc:  Oral Oral Oral  SpO2:  98% 98% 94%  Weight:  100.4 kg    Height:        Intake/Output Summary (Last 24 hours) at 03/10/2019 0955 Last data filed at 03/09/2019 2045 Gross per 24 hour  Intake 718 ml  Output 4000  ml  Net -3282 ml   Filed Weights   03/08/19 2215 03/09/19 1220 03/09/19 1526  Weight: 102.3 kg 104.8 kg 100.4 kg    Examination:   General: Not in pain or dyspnea, deconditioned  Neurology: Awake and alert, non focal  E ENT: mild pallor, no icterus, oral mucosa moist Cardiovascular: No JVD. S1-S2 present, rhythmic, no gallops, rubs, or murmurs. No lower extremity edema. Pulmonary: positive breath sounds bilaterally. No wheezing, rhonchi or rales. Gastrointestinal. Abdomen with no organomegaly, non tender, no rebound or guarding Skin. No rashes Musculoskeletal: no joint deformities     Data Reviewed: I have personally reviewed following labs and imaging studies  CBC: Recent Labs  Lab 03/08/19 1553 03/09/19 0545 03/10/19 0426  WBC 6.5 6.2 8.3  NEUTROABS 5.5 5.3 6.7  HGB 11.3* 10.2* 12.2*  HCT 35.0* 30.8* 35.9*  MCV 82.5 79.8* 80.1  PLT 177 174 169   Basic Metabolic Panel: Recent Labs  Lab 03/08/19 1553 03/09/19 0545 03/10/19 0426  NA 132* 127* 130*  K 4.3 4.7 5.3*  CL 91* 87* 89*  CO2 21* 19* 19*  GLUCOSE 76 160* 215*  BUN 78* 84* 65*  CREATININE 17.67* 18.73* 15.15*  CALCIUM 7.3* 6.5* 8.6*   GFR: Estimated Creatinine Clearance: 6.5 mL/min (A) (by C-G formula based on SCr of 15.15 mg/dL (H)). Liver Function Tests: Recent Labs  Lab 03/08/19 1553 03/09/19 0545 03/10/19 0426  AST 101* 105* 124*  ALT 37 38 45*  ALKPHOS 20* 20* 24*  BILITOT 0.6 0.8 0.8  PROT 7.3 6.5 7.8  ALBUMIN 2.8* 2.5* 2.8*   No results for input(s): LIPASE, AMYLASE in the last 168 hours. No results for input(s): AMMONIA in the last 168 hours. Coagulation Profile: No results for input(s): INR, PROTIME in the last 168 hours. Cardiac Enzymes: No results for input(s): CKTOTAL, CKMB, CKMBINDEX, TROPONINI in the last 168 hours. BNP (last 3 results) No results for input(s): PROBNP in the last 8760 hours. HbA1C: Recent Labs    03/09/19 0545  HGBA1C 6.2*   CBG: Recent Labs  Lab  03/09/19 2047 03/09/19 2247 03/10/19 0046 03/10/19 0451 03/10/19 0902  GLUCAP 239* 235* 245* 200* 195*   Lipid Profile: Recent Labs    03/08/19 2026  TRIG 197*   Thyroid Function Tests: No results for input(s): TSH, T4TOTAL, FREET4, T3FREE, THYROIDAB in the last 72 hours. Anemia Panel: Recent Labs    03/08/19 2026 03/10/19 0426  FERRITIN 3,655* 5,495*      Radiology Studies: I have reviewed all of the imaging during this hospital visit personally     Scheduled Meds: . atorvastatin  20 mg Oral Daily  . calcium acetate  667 mg Oral QAC supper  . Chlorhexidine Gluconate Cloth  6 each Topical Q0600  . dexamethasone (DECADRON) injection  6 mg Intravenous Daily  . fenofibrate  160 mg Oral Daily  . heparin  5,000 Units Subcutaneous Q8H  . insulin aspart  0-9 Units Subcutaneous TID WC  . midodrine  10 mg Oral TID WC   Continuous Infusions: . sodium chloride    .  sodium chloride    . remdesivir 100 mg in NS 250 mL       LOS: 2 days        Antwoine Zorn Gerome Apley, MD

## 2019-03-10 NOTE — Progress Notes (Signed)
[facility] Progress Note  Subjective:  D/w PMD, pt is doing better , will be there until 9/18 to complete antiviral Rx   Vitals:   03/09/19 1515 03/09/19 1526 03/09/19 2255 03/10/19 0904  BP: (!) 121/53 131/61 94/61 (!) 88/63  Pulse: (!) 107 (!) 107  86  Resp:  20 14 17   Temp:  99.9 F (37.7 C) 99.9 F (37.7 C) 99.2 F (37.3 C)  TempSrc:  Oral Oral Oral  SpO2:  98% 98% 94%  Weight:  100.4 kg    Height:        Inpatient medications: . atorvastatin  20 mg Oral Daily  . calcium acetate  667 mg Oral QAC supper  . Chlorhexidine Gluconate Cloth  6 each Topical Q0600  . dexamethasone (DECADRON) injection  6 mg Intravenous Daily  . fenofibrate  160 mg Oral Daily  . heparin  5,000 Units Subcutaneous Q8H  . insulin aspart  0-9 Units Subcutaneous TID WC  . midodrine  10 mg Oral TID WC   . sodium chloride    . sodium chloride    . remdesivir 100 mg in NS 250 mL     sodium chloride, sodium chloride, alteplase, calcium carbonate, chlorpheniramine-HYDROcodone, guaiFENesin-dextromethorphan, heparin, lidocaine (PF), lidocaine-prilocaine, ondansetron **OR** ondansetron (ZOFRAN) IV, pentafluoroprop-tetrafluoroeth    Exam:  Patient not examined directly given COVID-19 + status, utilizing exam of the primary team and observations of RN's.     Home meds:  - midodrine 10 tid  - simvastatin 40 qd/ fenofibrate 160 qd  - glimepiride 2mg  qam  - calc acetate ac tid   Dialysis: AF MWF (will be COVID shift TTS 12pm G-O at dc)  4h   450/ 1.5  2K/3.5Ca bath  99.5kg  AVF   Hep 10000  mircera 50 on 9/2, now off, Hb 11.8 9/9   Assessment/ Plan: # End-stage renal disease - HD MWF. HD yest w/ 4L off, close to dry wt. Needs HD tomorrow for Sarajane Jews (B/Cr) then will transition to TTS to get him on the OP COVID schedule (TTS at G-O).   # COVID positive - CXR with no evidence of airspace dz - Treatments per primary team  # Hypoglycemia - per primary team  # Chronic  hypotension - takes midodrine 10 tid at home - cont midodrine here  # Volume - up 2-3kg by wts  # Fever - Covid positive - noted blood culture in process   # Anemia of chronic disease - s/p mircera above as an outpatient on 9/2  - Follow trends   # Hypocalcemia - improves with correction for albumin - High calcium bath  - On calcium supplement     Kelly Splinter, MD 03/10/2019, 12:29 PM    Iron/TIBC/Ferritin/ %Sat    Component Value Date/Time   FERRITIN 5,495 (H) 03/10/2019 0426   Recent Labs  Lab 03/10/19 0426  NA 130*  K 5.3*  CL 89*  CO2 19*  GLUCOSE 215*  BUN 65*  CREATININE 15.15*  CALCIUM 8.6*  ALBUMIN 2.8*   Recent Labs  Lab 03/10/19 0426  AST 124*  ALT 45*  ALKPHOS 24*  BILITOT 0.8  PROT 7.8   Recent Labs  Lab 03/10/19 0426  WBC 8.3  HGB 12.2*  HCT 35.9*  PLT 269

## 2019-03-10 NOTE — Progress Notes (Signed)
Patient son called and updated

## 2019-03-10 NOTE — Progress Notes (Signed)
msg sent to Dr.Kirby. Accu check 308. No noc coverage ordered. Pt on Dexamethasone. Hx of sugars in the 30's and 40's.

## 2019-03-11 LAB — COMPREHENSIVE METABOLIC PANEL
ALT: 45 U/L — ABNORMAL HIGH (ref 0–44)
AST: 104 U/L — ABNORMAL HIGH (ref 15–41)
Albumin: 2.4 g/dL — ABNORMAL LOW (ref 3.5–5.0)
Alkaline Phosphatase: 25 U/L — ABNORMAL LOW (ref 38–126)
Anion gap: 23 — ABNORMAL HIGH (ref 5–15)
BUN: 118 mg/dL — ABNORMAL HIGH (ref 6–20)
CO2: 17 mmol/L — ABNORMAL LOW (ref 22–32)
Calcium: 7.2 mg/dL — ABNORMAL LOW (ref 8.9–10.3)
Chloride: 87 mmol/L — ABNORMAL LOW (ref 98–111)
Creatinine, Ser: 17.75 mg/dL — ABNORMAL HIGH (ref 0.61–1.24)
GFR calc Af Amer: 3 mL/min — ABNORMAL LOW (ref >60)
GFR calc non Af Amer: 3 mL/min — ABNORMAL LOW (ref >60)
Glucose, Bld: 211 mg/dL — ABNORMAL HIGH (ref 70–99)
Potassium: 6.4 mmol/L (ref 3.5–5.1)
Sodium: 127 mmol/L — ABNORMAL LOW (ref 135–145)
Total Bilirubin: 1.1 mg/dL (ref 0.3–1.2)
Total Protein: 6.4 g/dL — ABNORMAL LOW (ref 6.5–8.1)

## 2019-03-11 LAB — CBC WITH DIFFERENTIAL/PLATELET
Abs Immature Granulocytes: 0.06 10*3/uL (ref 0.00–0.07)
Basophils Absolute: 0 10*3/uL (ref 0.0–0.1)
Basophils Relative: 0 %
Eosinophils Absolute: 0 10*3/uL (ref 0.0–0.5)
Eosinophils Relative: 0 %
HCT: 33.6 % — ABNORMAL LOW (ref 39.0–52.0)
Hemoglobin: 11.2 g/dL — ABNORMAL LOW (ref 13.0–17.0)
Immature Granulocytes: 1 %
Lymphocytes Relative: 10 %
Lymphs Abs: 0.7 10*3/uL (ref 0.7–4.0)
MCH: 26.6 pg (ref 26.0–34.0)
MCHC: 33.3 g/dL (ref 30.0–36.0)
MCV: 79.8 fL — ABNORMAL LOW (ref 80.0–100.0)
Monocytes Absolute: 0.5 10*3/uL (ref 0.1–1.0)
Monocytes Relative: 8 %
Neutro Abs: 5.2 10*3/uL (ref 1.7–7.7)
Neutrophils Relative %: 81 %
Platelets: 312 10*3/uL (ref 150–400)
RBC: 4.21 MIL/uL — ABNORMAL LOW (ref 4.22–5.81)
RDW: 14.4 % (ref 11.5–15.5)
WBC: 6.5 10*3/uL (ref 4.0–10.5)
nRBC: 0 % (ref 0.0–0.2)

## 2019-03-11 LAB — GLUCOSE, CAPILLARY
Glucose-Capillary: 201 mg/dL — ABNORMAL HIGH (ref 70–99)
Glucose-Capillary: 282 mg/dL — ABNORMAL HIGH (ref 70–99)
Glucose-Capillary: 168 mg/dL — ABNORMAL HIGH (ref 70–99)
Glucose-Capillary: 280 mg/dL — ABNORMAL HIGH (ref 70–99)

## 2019-03-11 LAB — D-DIMER, QUANTITATIVE: D-Dimer, Quant: 1.63 ug/mL-FEU — ABNORMAL HIGH (ref 0.00–0.50)

## 2019-03-11 LAB — C-REACTIVE PROTEIN: CRP: 16.9 mg/dL — ABNORMAL HIGH (ref <1.0)

## 2019-03-11 LAB — FERRITIN: Ferritin: 5018 ng/mL — ABNORMAL HIGH (ref 24–336)

## 2019-03-11 MED ORDER — HEPARIN SODIUM (PORCINE) 1000 UNIT/ML DIALYSIS
5000.0000 [IU] | INTRAMUSCULAR | Status: DC | PRN
  Filled 2019-03-11: qty 5

## 2019-03-11 MED ORDER — INSULIN ASPART 100 UNIT/ML ~~LOC~~ SOLN
4.0000 [IU] | Freq: Once | SUBCUTANEOUS | Status: AC
  Administered 2019-03-11: 4 [IU] via SUBCUTANEOUS

## 2019-03-11 MED ORDER — HEPARIN SODIUM (PORCINE) 1000 UNIT/ML IJ SOLN
INTRAMUSCULAR | Status: AC
  Filled 2019-03-11: qty 10

## 2019-03-11 MED ORDER — CHLORHEXIDINE GLUCONATE CLOTH 2 % EX PADS: 6.0000 | MEDICATED_PAD | Freq: Every day | CUTANEOUS | Status: DC

## 2019-03-11 NOTE — Progress Notes (Signed)
Inpatient Diabetes Program Recommendations  AACE/ADA: New Consensus Statement on Inpatient Glycemic Control   Target Ranges:  Prepandial:   less than 140 mg/dL      Peak postprandial:   less than 180 mg/dL (1-2 hours)      Critically ill patients:  140 - 180 mg/dL   Results for James Brown, James Brown (MRN [id number]) as of 03/11/2019 12:42  Ref. Range 03/10/2019 04:51 03/10/2019 09:02 03/10/2019 14:17 03/10/2019 17:27 03/10/2019 21:01 03/11/2019 08:14 03/11/2019 11:52  Glucose-Capillary Latest Ref Range: 70 - 99 mg/dL 200 (H) 195 (H) 234 (H) 330 (H) 308 (H) 201 (H) 282 (H)  Results for James Brown, James Brown (MRN [id number]) as of 03/11/2019 12:42  Ref. Range 03/09/2019 05:45  Hemoglobin A1C Latest Ref Range: 4.8 - 5.6 % 6.2 (H)   Review of Glycemic Control  Diabetes history: DM2 Outpatient Diabetes medications: Amaryl 2 mg daily Current orders for Inpatient glycemic control: Novolog 0-9 units TID with meals; Decadron 6 mg daily  Inpatient Diabetes Program Recommendations:   Insulin-Basal: If steroids are continued, please consider ordering Levemir 5 units Q24H.  Insulin-Meal Coverage: If steroids are continued, please consider ordering Novolog 3 units TID with meals for meal coverage if patient eats at least 50% of meals.   Thanks, Barnie Alderman, RN, MSN, CDE Diabetes Coordinator Inpatient Diabetes Program [phone] (Team Pager from 8am to 5pm)

## 2019-03-11 NOTE — Progress Notes (Signed)
[facility] Progress Note  Subjective:  Per PMD no new issues, stable   Vitals:   03/11/19 1515 03/11/19 1530 03/11/19 1600 03/11/19 1610  BP: (!) 92/50 (!) 82/43 (!) 88/42 93/60  Pulse: 78 82 80 70  Resp:    18  Temp:    97.8 F (36.6 C)  TempSrc:    Oral  SpO2:    97%  Weight:    98.3 kg  Height:        Inpatient medications: . atorvastatin  20 mg Oral Daily  . calcium acetate  667 mg Oral QAC supper  . Chlorhexidine Gluconate Cloth  6 each Topical Q0600  . Chlorhexidine Gluconate Cloth  6 each Topical Q0600  . dexamethasone (DECADRON) injection  6 mg Intravenous Daily  . fenofibrate  160 mg Oral Daily  . heparin      . heparin  5,000 Units Subcutaneous Q8H  . insulin aspart  0-9 Units Subcutaneous TID WC  . midodrine  10 mg Oral TID WC   . sodium chloride    . sodium chloride    . remdesivir 100 mg in NS 250 mL 100 mg (03/11/19 1644)   sodium chloride, sodium chloride, alteplase, calcium carbonate, chlorpheniramine-HYDROcodone, guaiFENesin-dextromethorphan, heparin, [START ON 03/12/2019] heparin, lidocaine (PF), lidocaine-prilocaine, ondansetron **OR** ondansetron (ZOFRAN) IV, pentafluoroprop-tetrafluoroeth    Exam:  Patient not examined directly given COVID-19 + status, utilizing exam of the primary team and observations of RN's.     Home meds:  - midodrine 10 tid  - simvastatin 40 qd/ fenofibrate 160 qd  - glimepiride 2mg  qam  - calc acetate ac tid   Dialysis: AF MWF (will be COVID shift TTS 12pm G-O at dc)  4h   450/ 1.5  2K/3.5Ca bath  99.5kg  AVF   Hep 10000  mircera 50 on 9/2, now off, Hb 11.8 9/9   Assessment/ Plan: # End-stage renal disease - HD MWF. HD yest w/ 4L off, close to dry wt.  Getting HD today for ^^solute then transition to TTS COVID schedule (TTS at G-O). Short HD tomorrow.   # COVID positive - CXR with no evidence of airspace dz - Treatments per primary team  # Hypoglycemia - per primary team  # Chronic  hypotension - takes midodrine 10 tid at home - cont midodrine here  # Volume - at dry wt  # Fever - Covid positive - noted blood culture in process   # Anemia of chronic disease - s/p mircera above as an outpatient on 9/2  - Follow trends   # Hypocalcemia - improves with correction for albumin - High calcium bath  - On calcium supplement     Kelly Splinter, MD 03/11/2019, 5:02 PM    Iron/TIBC/Ferritin/ %Sat    Component Value Date/Time   FERRITIN 5,018 (H) 03/11/2019 0514   Recent Labs  Lab 03/11/19 0514  NA 127*  K 6.4*  CL 87*  CO2 17*  GLUCOSE 211*  BUN 118*  CREATININE 17.75*  CALCIUM 7.2*  ALBUMIN 2.4*   Recent Labs  Lab 03/11/19 0514  AST 104*  ALT 45*  ALKPHOS 25*  BILITOT 1.1  PROT 6.4*   Recent Labs  Lab 03/11/19 0514  WBC 6.5  HGB 11.2*  HCT 33.6*  PLT 312

## 2019-03-11 NOTE — Progress Notes (Signed)
Informed pt of his sxs of low bld sugar which is flailing of the arms or stiff tremor of the RUE. Asked pt if he had a bld sugar machine at home and he stated "yes, but I don't use it." Explained to pt the Roxanne Mins' sensor that goes on his arm so that his family can also check his bld sugar if he faints or he is unable to do it. Note put on board for MD about the sensor and also the pt needing Diabetic Education but pt seems forgetful or not tracking on what is being said so daughter or son probably will need to be present for all teaching. Pt stated the sensor thing sounds good.

## 2019-03-11 NOTE — Progress Notes (Signed)
PROGRESS NOTE    James Brown  DJT:[id number] DOB: 10-28-1965 DOA: 03/08/2019 PCP: Mauricia Area, MD    Brief Narrative:  53 year old male who presented with weakness and fever. He does have significant past medical history for end-stage renal disease on hemodialysis, MWF, abdominalaortic aneurysm, type 2 diabetes mellitus, hypertension and dyslipidemia. He was brought by EMS due to altered mentation. Positive generalized malaise, fevers and chills. When EMS arrived his capillary glucose was 60.On his initial physical examination his temperature was 100.9, blood pressure 85/58, heart rate 89-108, respiratory rate 21-31, oxygen saturation 91-92-96%. He had decreased air entry bilaterally with positive Rales at bases, heart S1-S2 present and rhythmic, the abdomen was soft nontender, no lower extremity edema. Sodium 132, potassium 4.3, chloride 91, bicarb 21, glucose 76, BUN 78, creatinine 17, ferritin 3 655,CRP 8.7, procalcitonin 3,white cell count 6.5, hemoglobin 11.3, hematocrit 35.0, platelets 177,fibrinogen 540, d-dimer 1.2.SARS COVID-19 positive.His chest radiograph had increased interstitial markings at bases predominantly on the left. EKG 97 bpm, left axis deviation, QTC 450, sinus rhythm, no ST segment or T wave changes, poor R wave progression and low voltage.   Patient was admitted to the hospital with a working diagnosis of sepsis due to SARS COVID-19 infection/ viral pneumonia.  Clinically patient has been improving, he is high risk for ARDS due to COVID 19, will plan to complete 5 days of Remdesivir and dexamethasone before dc.   Assessment & Plan:   Principal Problem:   Pneumonia due to COVID-19 virus Active Problems:   Hyperlipidemia, mixed   Essential hypertension   ESRD   DM (diabetes mellitus) type II controlled with renal manifestation ([place])   COVID-19 virus infection   Hypoglycemia   Respiratory failure ([place])    1.Acute hypoxic  respiratory failure due to SARS COVID-19 viral pneumonia.symptoms continue to improve, today oxygenation  on room air oxygenation is 96%.   Inflammatory markers: are trending down,                                     Ferritin 3,655 > 5,495,>5,018                                     CRP 8,7 > 22,6, >16.9                                       d dimer from 1,22 > 1,88 > 1.63   Plan to continue remdesivir for 5 doses along with systemic steroids with dexamethasone.   2. T2DM with hypoglycemia. Fasting glucose this am is at 118, will continue glucose cover and monitoring with insulin sliding scale, patient on systemic steroids. At home on glimepiride.   3. ESRD on HD with hyponatremia and hyperkalemia. Na at 127 with K at 6,4, volume stable. Plan for hemodialysis today. Continue calcium acetate and calcium carbonate.   4. Hypotension/ chronic. his systolic blood pressure has been stable at 101 and 98. Continue with midodrine tid.   5. Dyslipidemia. Continue with atorvastatin and fenofibrate.   6. Obesity.  BMI calculated is 35,3  DVT prophylaxis:heparinsq Code Status:full Family Communication:no family at the bedside Disposition Plan/ discharge barriers:plan for dc 9/18 after completing 5 doses of remdesivir.      Body mass index  is 34.67 kg/m. Malnutrition Type:      Malnutrition Characteristics:      Nutrition Interventions:     RN Pressure Injury Documentation:     Consultants:   Nephrology   Procedures:     Antimicrobials:   Remdesivir.     Subjective: Patient continue to improve symptoms, no significant dyspnea, no chest pain, no nausea or vomiting. Has been out of bed.   Objective: Vitals:   03/11/19 0200 03/11/19 0300 03/11/19 0400 03/11/19 0812  BP:    98/68  Pulse:    62  Resp: (!) 21 17 (!) 21 18  Temp:    97.8 F (36.6 C)  TempSrc:      SpO2:    100%  Weight:      Height:        Intake/Output Summary (Last 24 hours)  at 03/11/2019 0836 Last data filed at 03/10/2019 2300 Gross per 24 hour  Intake 447.83 ml  Output -  Net 447.83 ml   Filed Weights   03/08/19 2215 03/09/19 1220 03/09/19 1526  Weight: 102.3 kg 104.8 kg 100.4 kg    Examination:   General: Not in pain or dyspnea, deconditioned  Neurology: Awake and alert, non focal  E ENT: mild pallor, no icterus, oral mucosa moist Cardiovascular: No JVD. S1-S2 present, rhythmic, no gallops, rubs, or murmurs. No lower extremity edema. Pulmonary: positive breath sounds bilaterally. Gastrointestinal. Abdomen with no organomegaly, non tender, no rebound or guarding Skin. No rashes Musculoskeletal: no joint deformities     Data Reviewed: I have personally reviewed following labs and imaging studies  CBC: Recent Labs  Lab 03/08/19 1553 03/09/19 0545 03/10/19 0426 03/11/19 0514  WBC 6.5 6.2 8.3 6.5  NEUTROABS 5.5 5.3 6.7 5.2  HGB 11.3* 10.2* 12.2* 11.2*  HCT 35.0* 30.8* 35.9* 33.6*  MCV 82.5 79.8* 80.1 79.8*  PLT 177 174 269 607   Basic Metabolic Panel: Recent Labs  Lab 03/08/19 1553 03/09/19 0545 03/10/19 0426 03/11/19 0514  NA 132* 127* 130* 127*  K 4.3 4.7 5.3* 6.4*  CL 91* 87* 89* 87*  CO2 21* 19* 19* 17*  GLUCOSE 76 160* 215* 211*  BUN 78* 84* 65* 118*  CREATININE 17.67* 18.73* 15.15* 17.75*  CALCIUM 7.3* 6.5* 8.6* 7.2*   GFR: Estimated Creatinine Clearance: 5.6 mL/min (A) (by C-G formula based on SCr of 17.75 mg/dL (H)). Liver Function Tests: Recent Labs  Lab 03/08/19 1553 03/09/19 0545 03/10/19 0426 03/11/19 0514  AST 101* 105* 124* 104*  ALT 37 38 45* 45*  ALKPHOS 20* 20* 24* 25*  BILITOT 0.6 0.8 0.8 1.1  PROT 7.3 6.5 7.8 6.4*  ALBUMIN 2.8* 2.5* 2.8* 2.4*   No results for input(s): LIPASE, AMYLASE in the last 168 hours. No results for input(s): AMMONIA in the last 168 hours. Coagulation Profile: No results for input(s): INR, PROTIME in the last 168 hours. Cardiac Enzymes: No results for input(s): CKTOTAL,  CKMB, CKMBINDEX, TROPONINI in the last 168 hours. BNP (last 3 results) No results for input(s): PROBNP in the last 8760 hours. HbA1C: Recent Labs    03/09/19 0545  HGBA1C 6.2*   CBG: Recent Labs  Lab 03/10/19 0902 03/10/19 1417 03/10/19 1727 03/10/19 2101 03/11/19 0814  GLUCAP 195* 234* 330* 308* 201*   Lipid Profile: Recent Labs    03/08/19 2026  TRIG 197*   Thyroid Function Tests: No results for input(s): TSH, T4TOTAL, FREET4, T3FREE, THYROIDAB in the last 72 hours. Anemia Panel: Recent Labs  03/10/19 0426 03/11/19 0514  FERRITIN 5,495* 5,018*      Radiology Studies: I have reviewed all of the imaging during this hospital visit personally     Scheduled Meds: . heparin      . atorvastatin  20 mg Oral Daily  . calcium acetate  667 mg Oral QAC supper  . Chlorhexidine Gluconate Cloth  6 each Topical Q0600  . Chlorhexidine Gluconate Cloth  6 each Topical Q0600  . dexamethasone (DECADRON) injection  6 mg Intravenous Daily  . fenofibrate  160 mg Oral Daily  . heparin  5,000 Units Subcutaneous Q8H  . insulin aspart  0-9 Units Subcutaneous TID WC  . midodrine  10 mg Oral TID WC   Continuous Infusions: . sodium chloride    . sodium chloride    . remdesivir 100 mg in NS 250 mL 500 mL/hr at 03/10/19 1800     LOS: 3 days        Monice Lundy Gerome Apley, MD

## 2019-03-11 NOTE — Progress Notes (Signed)
CRITICAL VALUE ALERT  Critical Value:  Potassium 6.4  Date & Time Notied:  0715   Provider notified at 0730  Orders Received/Actions taken: Pt scheduled for HD in 1 hour

## 2019-03-12 ENCOUNTER — Inpatient Hospital Stay (HOSPITAL_COMMUNITY): Payer: Medicare Other

## 2019-03-12 LAB — COMPREHENSIVE METABOLIC PANEL
ALT: 44 U/L (ref 0–44)
AST: 76 U/L — ABNORMAL HIGH (ref 15–41)
Albumin: 2.4 g/dL — ABNORMAL LOW (ref 3.5–5.0)
Alkaline Phosphatase: 31 U/L — ABNORMAL LOW (ref 38–126)
Anion gap: 21 — ABNORMAL HIGH (ref 5–15)
BUN: 89 mg/dL — ABNORMAL HIGH (ref 6–20)
CO2: 22 mmol/L (ref 22–32)
Calcium: 7 mg/dL — ABNORMAL LOW (ref 8.9–10.3)
Chloride: 89 mmol/L — ABNORMAL LOW (ref 98–111)
Creatinine, Ser: 12.88 mg/dL — ABNORMAL HIGH (ref 0.61–1.24)
GFR calc Af Amer: 5 mL/min — ABNORMAL LOW (ref >60)
GFR calc non Af Amer: 4 mL/min — ABNORMAL LOW (ref >60)
Glucose, Bld: 261 mg/dL — ABNORMAL HIGH (ref 70–99)
Potassium: 5.2 mmol/L — ABNORMAL HIGH (ref 3.5–5.1)
Sodium: 132 mmol/L — ABNORMAL LOW (ref 135–145)
Total Bilirubin: 0.7 mg/dL (ref 0.3–1.2)
Total Protein: 6.4 g/dL — ABNORMAL LOW (ref 6.5–8.1)

## 2019-03-12 LAB — GLUCOSE, CAPILLARY
Glucose-Capillary: 242 mg/dL — ABNORMAL HIGH (ref 70–99)
Glucose-Capillary: 263 mg/dL — ABNORMAL HIGH (ref 70–99)
Glucose-Capillary: 135 mg/dL — ABNORMAL HIGH (ref 70–99)
Glucose-Capillary: 402 mg/dL — ABNORMAL HIGH (ref 70–99)
Glucose-Capillary: 330 mg/dL — ABNORMAL HIGH (ref 70–99)

## 2019-03-12 LAB — C-REACTIVE PROTEIN: CRP: 10.4 mg/dL — ABNORMAL HIGH (ref <1.0)

## 2019-03-12 LAB — D-DIMER, QUANTITATIVE: D-Dimer, Quant: 1.04 ug/mL-FEU — ABNORMAL HIGH (ref 0.00–0.50)

## 2019-03-12 LAB — FERRITIN: Ferritin: 5761 ng/mL — ABNORMAL HIGH (ref 24–336)

## 2019-03-12 MED ORDER — HEPARIN SODIUM (PORCINE) 1000 UNIT/ML DIALYSIS
5000.0000 [IU] | INTRAMUSCULAR | Status: DC | PRN
  Filled 2019-03-12: qty 5

## 2019-03-12 MED ORDER — INSULIN ASPART 100 UNIT/ML ~~LOC~~ SOLN
5.0000 [IU] | Freq: Once | SUBCUTANEOUS | Status: AC
  Administered 2019-03-12: 5 [IU] via SUBCUTANEOUS

## 2019-03-12 NOTE — Progress Notes (Signed)
Pt's oxygen saturation ranging from 78-100. Oxygen saturation going down to 78 and then raising back to 100 without intervention. This nurse assesses pt who is easily and aroused and oriented x4. Pt is instructed to slide up in bed and oxygen is placed on pt at 2Ls per [place]. BGM taken. Pt's oxygen remaining in the 90s. Will continue to monitor.

## 2019-03-12 NOTE — Progress Notes (Signed)
PROGRESS NOTE    Serafin Lamar  HCW:[id number] DOB: September 20, 1965 DOA: 03/08/2019 PCP: Mauricia Area, MD    Brief Narrative:  53 year old male who presented with weakness and fever. He does have significant past medical history for end-stage renal disease on hemodialysis, MWF, abdominalaortic aneurysm, type 2 diabetes mellitus, hypertension and dyslipidemia. He was brought by EMS due to altered mentation. Positive generalized malaise, fevers and chills. When EMS arrived his capillary glucose was 60.On his initial physical examination his temperature was 100.9, blood pressure 85/58, heart rate 89-108, respiratory rate 21-31, oxygen saturation 91-92-96%. He had decreased air entry bilaterally with positive Rales at bases, heart S1-S2 present and rhythmic, the abdomen was soft nontender, no lower extremity edema. Sodium 132, potassium 4.3, chloride 91, bicarb 21, glucose 76, BUN 78, creatinine 17, ferritin 3 655,CRP 8.7, procalcitonin 3,white cell count 6.5, hemoglobin 11.3, hematocrit 35.0, platelets 177,fibrinogen 540, d-dimer 1.2.SARS COVID-19 positive.His chest radiograph had increased interstitial markings at bases predominantly on the left. EKG 97 bpm, left axis deviation, QTC 450, sinus rhythm, no ST segment or T wave changes, poor R wave progression and low voltage.   Patient was admitted to the hospital with a working diagnosis of sepsis due to SARS COVID-19 infection/ viral pneumonia.  Clinically patient has been improving, he is high risk for ARDS due to SARS COVID 19, plan to complete 5 days of Remdesivir and dexamethasone before dc.   Assessment & Plan:   Principal Problem:   Pneumonia due to COVID-19 virus Active Problems:   Hyperlipidemia, mixed   Essential hypertension   ESRD   DM (diabetes mellitus) type II controlled with renal manifestation ([place])   COVID-19 virus infection   Hypoglycemia   Respiratory failure ([place])    1.Acute hypoxic  respiratory failure due to SARS COVID-19viralpneumonia.Had episodic oxygen desaturation last night, down to 78%. This am with improving dyspnea and cough, oxygen saturation is up to 97% back on room air.   Inflammatory markers: are trending down,                                     Ferritin 3,655 > 5,495,>5,018> 5,761                                     CRP 8,7 > 22,6, >16.9>10.4                                       d dimer from 1,22 > 1,88 > 1.63>1,04   Patient will complete 5 days of SARS COVID 19 therapy with Remdesivir and dexamethasone. Plan for dc home after the last dose if continue to be clinically stable.     2. T2DM with hypoglycemia.Fasting glucose this am is at 261, if patient gets discharged tomorrow, his last dose of dexamethasone will be in am. For now will continue glucose cover and monitoring with insulin sliding scale. To resume glimepiride at discharge per home regimen.  3. ESRD on HD with hyponatremia and hyperkalemia. Today's Na is 132 and K at 5,2. adequate volume status. 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 Will continue with nephrology recommendations for renal replacement therapy.    4. Hypotension/ chronic. Continue with midodrine tid for blood pressure control. Today's systolic  108 to 134 mmHg.     5. Dyslipidemia.On atorvastatin and fenofibrate.   6. Obesity. BMI  is 35,3 will need outpatient follow up.   DVT prophylaxis:heparinsq Code Status:full Family Communication:no family at the bedside Disposition Plan/ discharge barriers:plan for dc 9/18 after completing 5 doses of remdesivir.     Body mass index is 34.91 kg/m. Malnutrition Type:      Malnutrition Characteristics:      Nutrition Interventions:     RN Pressure Injury Documentation:     Consultants:   Nephrology   Procedures:     Antimicrobials:       Subjective: Patient had episode of desaturation last night,  improved with supplemental 02 per [place], this am with no dyspnea, cough is improving, no sore throat. No nausea or vomiting and tolerating po well.   Objective: Vitals:   03/12/19 1206 03/12/19 1215 03/12/19 1230 03/12/19 1245  BP: (!) 97/55 (!) 110/56 (!) 94/59 113/61  Pulse: 72 73 84 67  Resp: 18 18 20 16   Temp:      TempSrc:      SpO2:      Weight:      Height:        Intake/Output Summary (Last 24 hours) at 03/12/2019 1253 Last data filed at 03/12/2019 0747 Gross per 24 hour  Intake 600 ml  Output 2100 ml  Net -1500 ml   Filed Weights   03/11/19 1248 03/11/19 1610 03/12/19 1058  Weight: 101.5 kg 98.3 kg 101.1 kg    Examination:   General: deconditioned  Neurology: Awake and alert, non focal  E ENT: mild pallor, no icterus, oral mucosa moist Cardiovascular: No JVD. S1-S2 present, rhythmic, no gallops, rubs, or murmurs. No lower extremity edema. Pulmonary: positive breath sounds bilaterally. Gastrointestinal. Abdomen with no organomegaly, non tender, no rebound or guarding Skin. No rashes Musculoskeletal: no joint deformities     Data Reviewed: I have personally reviewed following labs and imaging studies  CBC: Recent Labs  Lab 03/08/19 1553 03/09/19 0545 03/10/19 0426 03/11/19 0514  WBC 6.5 6.2 8.3 6.5  NEUTROABS 5.5 5.3 6.7 5.2  HGB 11.3* 10.2* 12.2* 11.2*  HCT 35.0* 30.8* 35.9* 33.6*  MCV 82.5 79.8* 80.1 79.8*  PLT 177 174 269 093   Basic Metabolic Panel: Recent Labs  Lab 03/08/19 1553 03/09/19 0545 03/10/19 0426 03/11/19 0514 03/12/19 0518  NA 132* 127* 130* 127* 132*  K 4.3 4.7 5.3* 6.4* 5.2*  CL 91* 87* 89* 87* 89*  CO2 21* 19* 19* 17* 22  GLUCOSE 76 160* 215* 211* 261*  BUN 78* 84* 65* 118* 89*  CREATININE 17.67* 18.73* 15.15* 17.75* 12.88*  CALCIUM 7.3* 6.5* 8.6* 7.2* 7.0*   GFR: Estimated Creatinine Clearance: 7.7 mL/min (A) (by C-G formula based on SCr of 12.88 mg/dL (H)). Liver Function Tests: Recent Labs  Lab 03/08/19 1553  03/09/19 0545 03/10/19 0426 03/11/19 0514 03/12/19 0518  AST 101* 105* 124* 104* 76*  ALT 37 38 45* 45* 44  ALKPHOS 20* 20* 24* 25* 31*  BILITOT 0.6 0.8 0.8 1.1 0.7  PROT 7.3 6.5 7.8 6.4* 6.4*  ALBUMIN 2.8* 2.5* 2.8* 2.4* 2.4*   No results for input(s): LIPASE, AMYLASE in the last 168 hours. No results for input(s): AMMONIA in the last 168 hours. Coagulation Profile: No results for input(s): INR, PROTIME in the last 168 hours. Cardiac Enzymes: No results for input(s): CKTOTAL, CKMB, CKMBINDEX, TROPONINI in the last 168 hours. BNP (last 3 results) No results for input(s):  PROBNP in the last 8760 hours. HbA1C: No results for input(s): HGBA1C in the last 72 hours. CBG: Recent Labs  Lab 03/11/19 1523 03/11/19 2101 03/12/19 0301 03/12/19 0725 03/12/19 1219  GLUCAP 168* 280* 242* 263* 135*   Lipid Profile: No results for input(s): CHOL, HDL, LDLCALC, TRIG, CHOLHDL, LDLDIRECT in the last 72 hours. Thyroid Function Tests: No results for input(s): TSH, T4TOTAL, FREET4, T3FREE, THYROIDAB in the last 72 hours. Anemia Panel: Recent Labs    03/11/19 0514 03/12/19 0518  FERRITIN 5,018* 5,761*      Radiology Studies: I have reviewed all of the imaging during this hospital visit personally     Scheduled Meds: . atorvastatin  20 mg Oral Daily  . calcium acetate  667 mg Oral QAC supper  . Chlorhexidine Gluconate Cloth  6 each Topical Q0600  . Chlorhexidine Gluconate Cloth  6 each Topical Q0600  . dexamethasone (DECADRON) injection  6 mg Intravenous Daily  . fenofibrate  160 mg Oral Daily  . heparin  5,000 Units Subcutaneous Q8H  . insulin aspart  0-9 Units Subcutaneous TID WC  . midodrine  10 mg Oral TID WC   Continuous Infusions: . sodium chloride    . sodium chloride    . remdesivir 100 mg in NS 250 mL 100 mg (03/11/19 1644)     LOS: 4 days        Mauricio Gerome Apley, MD

## 2019-03-12 NOTE — Progress Notes (Signed)
Renal Navigator called patient in his room to discuss change in OP HD clinic and schedule at discharge now that he has tested positive for COVID 19. Renal Navigator informed him that he will now receive OP HD treatment at [facility] T[facility] 12:00pm and that he should arrive to the side door of the clinic at 11:45am and wait in the parking lot until he is called in to the clinic by staff. He stated understanding. He states that his son will provide transportation.  Plan for discharge tomorrow, which patient is aware of. He understands that he will not have HD tomorrow and will start in the clinic on Saturday.  [facility] [address], [place], [place]   [name], [place] Renal Navigator [phone]

## 2019-03-12 NOTE — Progress Notes (Signed)
Inpatient Diabetes Program Recommendations  AACE/ADA: New Consensus Statement on Inpatient Glycemic Control (2015)  Target Ranges:  Prepandial:   less than 140 mg/dL      Peak postprandial:   less than 180 mg/dL (1-2 hours)      Critically ill patients:  140 - 180 mg/dL   Lab Results  Component Value Date   GLUCAP 263 (H) 03/12/2019   HGBA1C 6.2 (H) 03/09/2019    Review of Glycemic Control Results for James Brown, James Brown (MRN [id number]) as of 03/12/2019 12:05  Ref. Range 03/11/2019 15:23 03/11/2019 21:01 03/12/2019 03:01 03/12/2019 07:25  Glucose-Capillary Latest Ref Range: 70 - 99 mg/dL 168 (H) 280 (H) 242 (H) 263 (H)  Diabetes history: DM2 Outpatient Diabetes medications: Amaryl 2 mg daily Current orders for Inpatient glycemic control: Novolog 0-9 units TID with meals; Decadron 6 mg daily  Inpatient Diabetes Program Recommendations:   Insulin-Basal: If steroids are continued, please consider ordering Levemir 5 units Q24H.  Insulin-Meal Coverage: If steroids are continued, please consider ordering Novolog 3 units TID with meals for meal coverage if patient eats at least 50% of meals.  Thanks,  Adah Perl, RN, BC-ADM Inpatient Diabetes Coordinator Pager [phone] (8a-5p)

## 2019-03-12 NOTE — Progress Notes (Signed)
[facility] Progress Note  Subjective: seen in room, doing well, on RA.  No c/o's. Going home "tomorrow".    Vitals:   03/12/19 1245 03/12/19 1300 03/12/19 1315 03/12/19 1321  BP: 113/61 (!) 105/55 (!) 134/52 (!) 108/50  Pulse: 67 67 61 70  Resp: 16 13 19 19   Temp:    97.6 F (36.4 C)  TempSrc:    Oral  SpO2:    97%  Weight:    99.3 kg  Height:        Inpatient medications: . atorvastatin  20 mg Oral Daily  . calcium acetate  667 mg Oral QAC supper  . Chlorhexidine Gluconate Cloth  6 each Topical Q0600  . Chlorhexidine Gluconate Cloth  6 each Topical Q0600  . dexamethasone (DECADRON) injection  6 mg Intravenous Daily  . fenofibrate  160 mg Oral Daily  . heparin  5,000 Units Subcutaneous Q8H  . insulin aspart  0-9 Units Subcutaneous TID WC  . midodrine  10 mg Oral TID WC   . sodium chloride    . sodium chloride    . remdesivir 100 mg in NS 250 mL 100 mg (03/12/19 1616)   sodium chloride, sodium chloride, alteplase, calcium carbonate, chlorpheniramine-HYDROcodone, guaiFENesin-dextromethorphan, heparin, heparin, [START ON 03/13/2019] heparin, lidocaine (PF), lidocaine-prilocaine, ondansetron **OR** ondansetron (ZOFRAN) IV, pentafluoroprop-tetrafluoroeth    Exam:  Patient not examined directly given COVID-19 + status, utilizing exam of the primary team and observations of RN's.     Home meds:  - midodrine 10 tid  - simvastatin 40 qd/ fenofibrate 160 qd  - glimepiride 2mg  qam  - calc acetate ac tid   Dialysis: AF MWF (will be COVID shift TTS 12pm G-O at dc)  4h   450/ 1.5  2K/3.5Ca bath  99.5kg  AVF   Hep 10000  mircera 50 on 9/2, now off, Hb 11.8 9/9   Assessment/ Plan: # End-stage renal disease - HD MWF. HD yest w/ 4L off, close to dry wt. TTS COVID schedule (TTS at G-O). HD today then Sat.   # COVID positive - CXR with no evidence of airspace dz - Treatments per primary team. Doing very well, for dc tomorrow.   # Hypoglycemia - per primary  team  # Chronic hypotension - takes midodrine 10 tid at home - cont midodrine here  # Volume - at dry wt  # Fever - Covid positive - noted blood culture negative   # Anemia of chronic disease - s/p mircera above as an outpatient on 9/2  - Follow trends   # Hypocalcemia - improves with correction for albumin     Kelly Splinter, MD 03/12/2019, 4:29 PM    Iron/TIBC/Ferritin/ %Sat    Component Value Date/Time   FERRITIN 5,761 (H) 03/12/2019 0518   Recent Labs  Lab 03/12/19 0518  NA 132*  K 5.2*  CL 89*  CO2 22  GLUCOSE 261*  BUN 89*  CREATININE 12.88*  CALCIUM 7.0*  ALBUMIN 2.4*   Recent Labs  Lab 03/12/19 0518  AST 76*  ALT 44  ALKPHOS 31*  BILITOT 0.7  PROT 6.4*   Recent Labs  Lab 03/11/19 0514  WBC 6.5  HGB 11.2*  HCT 33.6*  PLT 312

## 2019-03-13 LAB — COMPREHENSIVE METABOLIC PANEL
ALT: 46 U/L — ABNORMAL HIGH (ref 0–44)
AST: 71 U/L — ABNORMAL HIGH (ref 15–41)
Albumin: 2.3 g/dL — ABNORMAL LOW (ref 3.5–5.0)
Alkaline Phosphatase: 37 U/L — ABNORMAL LOW (ref 38–126)
Anion gap: 19 — ABNORMAL HIGH (ref 5–15)
BUN: 99 mg/dL — ABNORMAL HIGH (ref 6–20)
CO2: 23 mmol/L (ref 22–32)
Calcium: 6.7 mg/dL — ABNORMAL LOW (ref 8.9–10.3)
Chloride: 91 mmol/L — ABNORMAL LOW (ref 98–111)
Creatinine, Ser: 11.67 mg/dL — ABNORMAL HIGH (ref 0.61–1.24)
GFR calc Af Amer: 5 mL/min — ABNORMAL LOW (ref >60)
GFR calc non Af Amer: 4 mL/min — ABNORMAL LOW (ref >60)
Glucose, Bld: 218 mg/dL — ABNORMAL HIGH (ref 70–99)
Potassium: 5.2 mmol/L — ABNORMAL HIGH (ref 3.5–5.1)
Sodium: 133 mmol/L — ABNORMAL LOW (ref 135–145)
Total Bilirubin: 0.8 mg/dL (ref 0.3–1.2)
Total Protein: 6.3 g/dL — ABNORMAL LOW (ref 6.5–8.1)

## 2019-03-13 LAB — C-REACTIVE PROTEIN: CRP: 6.2 mg/dL — ABNORMAL HIGH (ref <1.0)

## 2019-03-13 LAB — GLUCOSE, CAPILLARY: Glucose-Capillary: 216 mg/dL — ABNORMAL HIGH (ref 70–99)

## 2019-03-13 LAB — CULTURE, BLOOD (ROUTINE X 2)
Culture: NO GROWTH
Culture: NO GROWTH
Special Requests: ADEQUATE
Special Requests: ADEQUATE

## 2019-03-13 LAB — D-DIMER, QUANTITATIVE: D-Dimer, Quant: 0.92 ug/mL-FEU — ABNORMAL HIGH (ref 0.00–0.50)

## 2019-03-13 LAB — FERRITIN: Ferritin: 4804 ng/mL — ABNORMAL HIGH (ref 24–336)

## 2019-03-13 NOTE — Progress Notes (Signed)
Inpatient Diabetes Program Recommendations  AACE/ADA: New Consensus Statement on Inpatient Glycemic Control  Target Ranges:  Prepandial:   less than 140 mg/dL      Peak postprandial:   less than 180 mg/dL (1-2 hours)      Critically ill patients:  140 - 180 mg/dL   Results for James Brown, James Brown (MRN [id number]) as of 03/13/2019 09:11  Ref. Range 03/12/2019 07:25 03/12/2019 12:19 03/12/2019 17:24 03/12/2019 21:15 03/13/2019 08:08  Glucose-Capillary Latest Ref Range: 70 - 99 mg/dL 263 (H) 135 (H) 402 (H) 330 (H) 216 (H)   Review of Glycemic Control  Diabetes history: DM2 Outpatient Diabetes medications: Amaryl 2 mg daily Current orders for Inpatient glycemic control: Novolog 0-9 units TID with meals; Decadron 6 mg daily  Inpatient Diabetes Program Recommendations:   Insulin-Basal: If steroids are continued, please consider ordering Levemir 5 units Q24H.  Insulin-Meal Coverage: If steroids are continued, please consider ordering Novolog 3 units TID with meals for meal coverage if patient eats at least 50% of meals.   Thanks, Barnie Alderman, RN, MSN, CDE Diabetes Coordinator Inpatient Diabetes Program [phone] (Team Pager from 8am to 5pm)

## 2019-03-13 NOTE — Progress Notes (Signed)
Called daughter Ermalinda Barrios to come pick patient up for discharge. Reviewed discharge instructions with patient, including how to isolate himself from family in the home until he recovers from [place]. He will follow up with his PCP.

## 2019-03-13 NOTE — Progress Notes (Signed)
Renal Navigator notified [facility] OP [facility] clinic of patient's discharge today and start at [place] shift tomorrow.  [name], [place] Renal Navigator [phone]

## 2019-03-13 NOTE — Discharge Summary (Signed)
Physician Discharge Summary  James Brown WUJ:[id number] DOB: Nov 11, 1965 DOA: 03/08/2019  PCP: Mauricia Area, MD  Admit date: 03/08/2019 Discharge date: 03/13/2019  Admitted From: Home Discharge disposition: Home   Code Status: Full Code  Diet Recommendation: Calorie controlled, salt controlled diet  Recommendations for Outpatient Follow-Up:   1. Follow-up with PCP  Discharge Diagnosis:   Principal Problem:   Pneumonia due to COVID-19 virus Active Problems:   Hyperlipidemia, mixed   Essential hypertension   ESRD   DM (diabetes mellitus) type II controlled with renal manifestation ([place])   COVID-19 virus infection   Hypoglycemia   Respiratory failure ([place])    History of Present Illness / Brief narrative:  53 year old male who presented with weakness and fever. He does have significant past medical history for end-stage renal disease on hemodialysis, MWF, abdominalaortic aneurysm, type 2 diabetes mellitus, hypertension and dyslipidemia. He was brought by EMS due to altered mentation. Positive generalized malaise, fevers and chills. When EMS arrived his capillary glucose was 60.On his initial physical examination his temperature was 100.9, blood pressure 85/58, heart rate 89-108, respiratory rate 21-31, oxygen saturation 91-92-96%. He had decreased air entry bilaterally with positive Rales at bases, heart S1-S2 present and rhythmic, the abdomen was soft nontender, no lower extremity edema. Sodium 132, potassium 4.3, chloride 91, bicarb 21, glucose 76, BUN 78, creatinine 17, ferritin 3 655,CRP 8.7, procalcitonin 3,white cell count 6.5, hemoglobin 11.3, hematocrit 35.0, platelets 177,fibrinogen 540, d-dimer 1.2.SARS COVID-19 positive.His chest radiograph had increased interstitial markings at bases predominantly on the left. EKG 97 bpm, left axis deviation, QTC 450, sinus rhythm, no ST segment or T wave changes, poor R wave progression and low  voltage.  Patient was admitted to the hospital with a working diagnosis of sepsis due to SARS COVID-19 infection/ viral pneumonia.  Patient clinically improved with steroid and Remdesivir. Subjective:  Seen and examined this morning.  Middle-aged male.  Not in distress.  Not on oxygen supplementation. Wants to go home today.  Hospital Course:  1.Acute hypoxic respiratory failure due to SARS COVID-19viralpneumonia.symptoms continue to improve, currently on room air. Inflammatory markers: are trending down,                                     Ferritin 3,655 > 5,495,>5,018> 4804                                     CRP 8,7 > 22,6, >16.9 > 10.4                                      d dimer from 1,22 > 1,88 > 1.63 > 0.92  Patient completed the course of remdesivir for 5 doses along with systemic steroids with dexamethasone.   2. T2DM with hypoglycemia. -A1c6.2.  Blood glucose level was elevated in this hospitalization.  Systemic steroids.  Continue glimepiride at home.   3. ESRD on HD with hyponatremia and hyperkalemia.  Nephrology consultation appreciated.   Continue dialysis.  Continue calcium acetate and calcium carbonate.   4. Hypotension/ chronic.his systolic blood pressure has been stable at 101 and 98. Continue with midodrine tid.   5. Dyslipidemia.Continue with atorvastatin and fenofibrate.   6. Obesity. BMI calculated is 35.3  Stable for discharge  to home today  Discharge Exam:   Vitals:   03/12/19 1315 03/12/19 1321 03/13/19 0001 03/13/19 0813  BP: (!) 134/52 (!) 108/50 97/69 91/66   Pulse: 61 70  64  Resp: 19 19 20 20   Temp:  97.6 F (36.4 C) 97.7 F (36.5 C) (!) 97.5 F (36.4 C)  TempSrc:  Oral Oral Axillary  SpO2:  97% 98% 97%  Weight:  99.3 kg    Height:        Body mass index is 34.29 kg/m.  General exam: Appears calm and comfortable.  Skin: No rashes, lesions or ulcers. HEENT: Atraumatic, normocephalic, supple neck, no obvious  bleeding Lungs: Clear to auscultation bilaterally CVS: Regular rate and rhythm, no murmur GI/Abd soft, nontender, nondistended, bowel sounds positive CNS: Alert, awake, oriented x3 Psychiatry: Mood appropriate Extremities: No pedal edema, no calf tenderness  Discharge Instructions:  Wound care: None Discharge Instructions    Diet - low sodium heart healthy   Complete by: As directed    Calorie controlled diet   Increase activity slowly   Complete by: As directed       Allergies as of 03/13/2019      Reactions   Aplisol [tuberculin] Swelling      Medication List    STOP taking these medications   simvastatin 80 MG tablet Commonly known as: ZOCOR     TAKE these medications   atorvastatin 20 MG tablet Commonly known as: LIPITOR Take 20 mg by mouth daily.   Calcium Acetate 667 MG Tabs Take 1 tablet by mouth daily.   fenofibrate 160 MG tablet Take 160 mg by mouth daily.   glimepiride 1 MG tablet Commonly known as: AMARYL Take 2 mg by mouth daily with breakfast.   midodrine 10 MG tablet Commonly known as: PROAMATINE Take 10 mg by mouth 3 (three) times daily.   Tums 500 MG chewable tablet Generic drug: calcium carbonate Chew 1 tablet by mouth 3 (three) times daily as needed for indigestion.       Time coordinating discharge: 35 minutes  The results of significant diagnostics from this hospitalization (including imaging, microbiology, ancillary and laboratory) are listed below for reference.    Procedures and Diagnostic Studies:   Dg Chest 1 View  Result Date: 03/09/2019 CLINICAL DATA:  Shortness of breath.  Positive for COVID-19. EXAM: CHEST  1 VIEW COMPARISON:  One-view chest x-ray 03/08/2019 FINDINGS: The heart size is exaggerate by low lung volumes. Atherosclerotic calcifications are present at the aortic arch. Patchy airspace opacities are more prominent on today's study. There is disease in the upper lobes bilaterally. Medial basilar airspace disease has  increased as well. IMPRESSION: 1. Developing patchy bilateral airspace opacities in the upper and lower lobes. 2. Decreased lung volumes. 3. Aortic atherosclerosis. Electronically Signed   By: San Morelle M.D.   On: 03/09/2019 09:01   Dg Chest Portable 1 View  Result Date: 03/08/2019 CLINICAL DATA:  Acute weakness and fever. EXAM: PORTABLE CHEST 1 VIEW COMPARISON:  06/23/2015 and prior radiographs FINDINGS: The cardiomediastinal silhouette is unremarkable. There is no evidence of focal airspace disease, pulmonary edema, suspicious pulmonary nodule/mass, pleural effusion, or pneumothorax. No acute bony abnormalities are identified. IMPRESSION: No active disease. Electronically Signed   By: Margarette Canada M.D.   On: 03/08/2019 15:53     Labs:   Basic Metabolic Panel: Recent Labs  Lab 03/09/19 0545 03/10/19 0426 03/11/19 0514 03/12/19 0518 03/13/19 0722  NA 127* 130* 127* 132* 133*  K 4.7 5.3* 6.4*  5.2* 5.2*  CL 87* 89* 87* 89* 91*  CO2 19* 19* 17* 22 23  GLUCOSE 160* 215* 211* 261* 218*  BUN 84* 65* 118* 89* 99*  CREATININE 18.73* 15.15* 17.75* 12.88* 11.67*  CALCIUM 6.5* 8.6* 7.2* 7.0* 6.7*   GFR Estimated Creatinine Clearance: 8.4 mL/min (A) (by C-G formula based on SCr of 11.67 mg/dL (H)). Liver Function Tests: Recent Labs  Lab 03/09/19 0545 03/10/19 0426 03/11/19 0514 03/12/19 0518 03/13/19 0722  AST 105* 124* 104* 76* 71*  ALT 38 45* 45* 44 46*  ALKPHOS 20* 24* 25* 31* 37*  BILITOT 0.8 0.8 1.1 0.7 0.8  PROT 6.5 7.8 6.4* 6.4* 6.3*  ALBUMIN 2.5* 2.8* 2.4* 2.4* 2.3*   No results for input(s): LIPASE, AMYLASE in the last 168 hours. No results for input(s): AMMONIA in the last 168 hours. Coagulation profile No results for input(s): INR, PROTIME in the last 168 hours.  CBC: Recent Labs  Lab 03/08/19 1553 03/09/19 0545 03/10/19 0426 03/11/19 0514  WBC 6.5 6.2 8.3 6.5  NEUTROABS 5.5 5.3 6.7 5.2  HGB 11.3* 10.2* 12.2* 11.2*  HCT 35.0* 30.8* 35.9* 33.6*  MCV  82.5 79.8* 80.1 79.8*  PLT 177 174 269 312   Cardiac Enzymes: No results for input(s): CKTOTAL, CKMB, CKMBINDEX, TROPONINI in the last 168 hours. BNP: Invalid input(s): POCBNP CBG: Recent Labs  Lab 03/12/19 0725 03/12/19 1219 03/12/19 1724 03/12/19 2115 03/13/19 0808  GLUCAP 263* 135* 402* 330* 216*   D-Dimer Recent Labs    03/12/19 0518 03/13/19 0722  DDIMER 1.04* 0.92*   Hgb A1c No results for input(s): HGBA1C in the last 72 hours. Lipid Profile No results for input(s): CHOL, HDL, LDLCALC, TRIG, CHOLHDL, LDLDIRECT in the last 72 hours. Thyroid function studies No results for input(s): TSH, T4TOTAL, T3FREE, THYROIDAB in the last 72 hours.  Invalid input(s): FREET3 Anemia work up Recent Labs    03/12/19 0518 03/13/19 0722  FERRITIN 5,761* 4,804*   Microbiology Recent Results (from the past 240 hour(s))  Culture, blood (routine x 2)     Status: None (Preliminary result)   Collection Time: 03/08/19  3:53 PM   Specimen: BLOOD  Result Value Ref Range Status   Specimen Description BLOOD RIGHT ANTECUBITAL  Final   Special Requests   Final    BOTTLES DRAWN AEROBIC AND ANAEROBIC Blood Culture adequate volume   Culture   Final    NO GROWTH 4 DAYS Performed at [facility], [address], [place], [place] [address]    Report Status PENDING  Incomplete  Culture, blood (routine x 2)     Status: None (Preliminary result)   Collection Time: 03/08/19  3:53 PM   Specimen: BLOOD  Result Value Ref Range Status   Specimen Description BLOOD LEFT ANTECUBITAL  Final   Special Requests AEROBIC BOTTLE ONLY Blood Culture adequate volume  Final   Culture   Final    NO GROWTH 4 DAYS Performed at [facility], [place] [address], [place], [place] [address]    Report Status PENDING  Incomplete  SARS Coronavirus 2 [facility] order, Performed in [facility] hospital lab) Nasopharyngeal Nasopharyngeal Swab     Status: Abnormal   Collection Time: 03/08/19  4:01 PM   Specimen:  Nasopharyngeal Swab  Result Value Ref Range Status   SARS Coronavirus 2 POSITIVE (A) NEGATIVE Final    Comment: CRITICAL RESULT CALLED TO, READ BACK BY AND VERIFIED WITH: RN [place], [place] [phone] FCP (NOTE) If result  is NEGATIVE SARS-CoV-2 target nucleic acids are NOT DETECTED. The SARS-CoV-2 RNA is generally detectable in upper and lower  respiratory specimens during the acute phase of infection. The lowest  concentration of SARS-CoV-2 viral copies this assay can detect is 250  copies / mL. A negative result does not preclude SARS-CoV-2 infection  and should not be used as the sole basis for treatment or other  patient management decisions.  A negative result may occur with  improper specimen collection / handling, submission of specimen other  than nasopharyngeal swab, presence of viral mutation(s) within the  areas targeted by this assay, and inadequate number of viral copies  (<250 copies / mL). A negative result must be combined with clinical  observations, patient history, and epidemiological information. If result is POSITIVE SARS-CoV-2 target nucleic acids are DETECT ED. The SARS-CoV-2 RNA is generally detectable in upper and lower  respiratory specimens during the acute phase of infection.  Positive  results are indicative of active infection with SARS-CoV-2.  Clinical  correlation with patient history and other diagnostic information is  necessary to determine patient infection status.  Positive results do  not rule out bacterial infection or co-infection with other viruses. If result is PRESUMPTIVE POSTIVE SARS-CoV-2 nucleic acids MAY BE PRESENT.   A presumptive positive result was obtained on the submitted specimen  and confirmed on repeat testing.  While 2019 novel coronavirus  (SARS-CoV-2) nucleic acids may be present in the submitted sample  additional confirmatory testing may be necessary for epidemiological  and / or clinical management purposes  to  differentiate between  SARS-CoV-2 and other Sarbecovirus currently known to infect humans.  If clinically indicated additional testing with an alternate test  methodology ([id number] [id number]) is advised. The SARS-CoV-2 RNA is generally  detectable in upper and lower respiratory specimens during the acute  phase of infection. The expected result is Negative. Fact Sheet for Patients:  StrictlyIdeas.no Fact Sheet for Healthcare Providers: BankingDealers.co.za This test is not yet approved or cleared by the [place] FDA and has been authorized for detection and/or diagnosis of SARS-CoV-2 by FDA under an Emergency Use Authorization (EUA).  This EUA will remain in effect (meaning this test can be used) for the duration of the COVID-19 declaration under Section 564(b)(1) of the Act, 21 U.S.C. section 360bbb-3(b)(1), unless the authorization is terminated or revoked sooner. Performed at [facility], [place] [address] [place], [place] [address]     Please note: You were cared for by a hospitalist during your hospital stay. Once you are discharged, your primary care physician will handle any further medical issues. Please note that NO REFILLS for any discharge medications will be authorized once you are discharged, as it is imperative that you return to your primary care physician (or establish a relationship with a primary care physician if you do not have one) for your post hospital discharge needs so that they can reassess your need for medications and monitor your lab values.  Signed: Marlowe Aschoff Veola Cafaro  [facility] 03/13/2019, 11:00 AM

## 2019-04-13 ENCOUNTER — Inpatient Hospital Stay (HOSPITAL_COMMUNITY)
Admission: EM | Admit: 2019-04-13 | Discharge: 2019-04-18 | DRG: 208 | Disposition: A | Discharge status: Home / Self Care | Payer: Medicare Other | Attending: Internal Medicine | Admitting: Internal Medicine

## 2019-04-13 ENCOUNTER — Inpatient Hospital Stay (HOSPITAL_COMMUNITY): Payer: Medicare Other

## 2019-04-13 ENCOUNTER — Emergency Department (HOSPITAL_COMMUNITY): Payer: Medicare Other

## 2019-04-13 ENCOUNTER — Other Ambulatory Visit: Payer: Self-pay

## 2019-04-13 DIAGNOSIS — D631 Anemia in chronic kidney disease: Secondary | ICD-10-CM | POA: Diagnosis present

## 2019-04-13 DIAGNOSIS — J9601 Acute respiratory failure with hypoxia: Secondary | ICD-10-CM | POA: Diagnosis present

## 2019-04-13 DIAGNOSIS — I714 Abdominal aortic aneurysm, without rupture: Secondary | ICD-10-CM | POA: Diagnosis present

## 2019-04-13 DIAGNOSIS — I959 Hypotension, unspecified: Secondary | ICD-10-CM | POA: Diagnosis present

## 2019-04-13 DIAGNOSIS — E1165 Type 2 diabetes mellitus with hyperglycemia: Secondary | ICD-10-CM | POA: Diagnosis not present

## 2019-04-13 DIAGNOSIS — I120 Hypertensive chronic kidney disease with stage 5 chronic kidney disease or end stage renal disease: Secondary | ICD-10-CM | POA: Diagnosis present

## 2019-04-13 DIAGNOSIS — N186 End stage renal disease: Secondary | ICD-10-CM | POA: Diagnosis present

## 2019-04-13 DIAGNOSIS — E162 Hypoglycemia, unspecified: Secondary | ICD-10-CM | POA: Diagnosis present

## 2019-04-13 DIAGNOSIS — Z992 Dependence on renal dialysis: Secondary | ICD-10-CM | POA: Diagnosis not present

## 2019-04-13 DIAGNOSIS — U071 COVID-19: Secondary | ICD-10-CM | POA: Diagnosis not present

## 2019-04-13 DIAGNOSIS — Z8619 Personal history of other infectious and parasitic diseases: Secondary | ICD-10-CM | POA: Diagnosis not present

## 2019-04-13 DIAGNOSIS — I9589 Other hypotension: Secondary | ICD-10-CM | POA: Diagnosis present

## 2019-04-13 DIAGNOSIS — J1282 Pneumonia due to coronavirus disease 2019: Secondary | ICD-10-CM | POA: Diagnosis present

## 2019-04-13 DIAGNOSIS — E875 Hyperkalemia: Secondary | ICD-10-CM | POA: Diagnosis present

## 2019-04-13 DIAGNOSIS — Z8679 Personal history of other diseases of the circulatory system: Secondary | ICD-10-CM

## 2019-04-13 DIAGNOSIS — E119 Type 2 diabetes mellitus without complications: Secondary | ICD-10-CM

## 2019-04-13 DIAGNOSIS — E11649 Type 2 diabetes mellitus with hypoglycemia without coma: Secondary | ICD-10-CM | POA: Diagnosis present

## 2019-04-13 DIAGNOSIS — Z7984 Long term (current) use of oral hypoglycemic drugs: Secondary | ICD-10-CM

## 2019-04-13 DIAGNOSIS — R5381 Other malaise: Secondary | ICD-10-CM | POA: Diagnosis not present

## 2019-04-13 DIAGNOSIS — J1289 Other viral pneumonia: Secondary | ICD-10-CM

## 2019-04-13 DIAGNOSIS — G9340 Encephalopathy, unspecified: Secondary | ICD-10-CM | POA: Diagnosis not present

## 2019-04-13 DIAGNOSIS — R0902 Hypoxemia: Secondary | ICD-10-CM

## 2019-04-13 DIAGNOSIS — E785 Hyperlipidemia, unspecified: Secondary | ICD-10-CM | POA: Diagnosis present

## 2019-04-13 DIAGNOSIS — J810 Acute pulmonary edema: Secondary | ICD-10-CM | POA: Diagnosis present

## 2019-04-13 DIAGNOSIS — J9600 Acute respiratory failure, unspecified whether with hypoxia or hypercapnia: Secondary | ICD-10-CM

## 2019-04-13 DIAGNOSIS — J80 Acute respiratory distress syndrome: Secondary | ICD-10-CM | POA: Diagnosis not present

## 2019-04-13 DIAGNOSIS — I342 Nonrheumatic mitral (valve) stenosis: Secondary | ICD-10-CM | POA: Diagnosis not present

## 2019-04-13 DIAGNOSIS — E1122 Type 2 diabetes mellitus with diabetic chronic kidney disease: Secondary | ICD-10-CM | POA: Diagnosis present

## 2019-04-13 DIAGNOSIS — D638 Anemia in other chronic diseases classified elsewhere: Secondary | ICD-10-CM | POA: Diagnosis present

## 2019-04-13 DIAGNOSIS — Z4659 Encounter for fitting and adjustment of other gastrointestinal appliance and device: Secondary | ICD-10-CM

## 2019-04-13 DIAGNOSIS — Z9289 Personal history of other medical treatment: Secondary | ICD-10-CM

## 2019-04-13 LAB — BLOOD GAS, ARTERIAL
Acid-Base Excess: 5.1 mmol/L — ABNORMAL HIGH (ref 0.0–2.0)
Bicarbonate: 28.8 mmol/L — ABNORMAL HIGH (ref 20.0–28.0)
Drawn by: 244901
FIO2: 60
MECHVT: 380 mL
O2 Saturation: 99.6 %
PEEP: 10 cmH2O
Patient temperature: 99.4
RATE: 32 resp/min
pCO2 arterial: 40.4 mmHg (ref 32.0–48.0)
pH, Arterial: 7.468 — ABNORMAL HIGH (ref 7.350–7.450)
pO2, Arterial: 160 mmHg — ABNORMAL HIGH (ref 83.0–108.0)

## 2019-04-13 LAB — POCT I-STAT 7, (LYTES, BLD GAS, ICA,H+H)
Acid-Base Excess: 2 mmol/L (ref 0.0–2.0)
Acid-Base Excess: 1 mmol/L (ref 0.0–2.0)
Bicarbonate: 25.4 mmol/L (ref 20.0–28.0)
Bicarbonate: 27.2 mmol/L (ref 20.0–28.0)
Bicarbonate: 26.5 mmol/L (ref 20.0–28.0)
Calcium, Ion: 1.02 mmol/L — ABNORMAL LOW (ref 1.15–1.40)
Calcium, Ion: 1.05 mmol/L — ABNORMAL LOW (ref 1.15–1.40)
Calcium, Ion: 1 mmol/L — ABNORMAL LOW (ref 1.15–1.40)
HCT: 28 % — ABNORMAL LOW (ref 39.0–52.0)
HCT: 28 % — ABNORMAL LOW (ref 39.0–52.0)
HCT: 25 % — ABNORMAL LOW (ref 39.0–52.0)
Hemoglobin: 9.5 g/dL — ABNORMAL LOW (ref 13.0–17.0)
Hemoglobin: 9.5 g/dL — ABNORMAL LOW (ref 13.0–17.0)
Hemoglobin: 8.5 g/dL — ABNORMAL LOW (ref 13.0–17.0)
O2 Saturation: 97 %
O2 Saturation: 100 %
O2 Saturation: 99 %
Patient temperature: 99.8
Patient temperature: 99.8
Patient temperature: 99.2
Potassium: 4.2 mmol/L (ref 3.5–5.1)
Potassium: 4.8 mmol/L (ref 3.5–5.1)
Potassium: 4.5 mmol/L (ref 3.5–5.1)
Sodium: 140 mmol/L (ref 135–145)
Sodium: 138 mmol/L (ref 135–145)
Sodium: 139 mmol/L (ref 135–145)
TCO2: 26 mmol/L (ref 22–32)
TCO2: 29 mmol/L (ref 22–32)
TCO2: 28 mmol/L (ref 22–32)
pCO2 arterial: 35.2 mmHg (ref 32.0–48.0)
pCO2 arterial: 54.9 mmHg — ABNORMAL HIGH (ref 32.0–48.0)
pCO2 arterial: 44.5 mmHg (ref 32.0–48.0)
pH, Arterial: 7.469 — ABNORMAL HIGH (ref 7.350–7.450)
pH, Arterial: 7.305 — ABNORMAL LOW (ref 7.350–7.450)
pH, Arterial: 7.384 (ref 7.350–7.450)
pO2, Arterial: 91 mmHg (ref 83.0–108.0)
pO2, Arterial: 255 mmHg — ABNORMAL HIGH (ref 83.0–108.0)
pO2, Arterial: 151 mmHg — ABNORMAL HIGH (ref 83.0–108.0)

## 2019-04-13 LAB — BASIC METABOLIC PANEL
Anion gap: 17 — ABNORMAL HIGH (ref 5–15)
BUN: 53 mg/dL — ABNORMAL HIGH (ref 6–20)
CO2: 24 mmol/L (ref 22–32)
Calcium: 8 mg/dL — ABNORMAL LOW (ref 8.9–10.3)
Chloride: 99 mmol/L (ref 98–111)
Creatinine, Ser: 12.02 mg/dL — ABNORMAL HIGH (ref 0.61–1.24)
GFR calc Af Amer: 5 mL/min — ABNORMAL LOW (ref >60)
GFR calc non Af Amer: 4 mL/min — ABNORMAL LOW (ref >60)
Glucose, Bld: 75 mg/dL (ref 70–99)
Potassium: 4 mmol/L (ref 3.5–5.1)
Sodium: 140 mmol/L (ref 135–145)

## 2019-04-13 LAB — POCT I-STAT EG7
Acid-Base Excess: 1 mmol/L (ref 0.0–2.0)
Bicarbonate: 26.5 mmol/L (ref 20.0–28.0)
Calcium, Ion: 0.97 mmol/L — ABNORMAL LOW (ref 1.15–1.40)
HCT: 30 % — ABNORMAL LOW (ref 39.0–52.0)
Hemoglobin: 10.2 g/dL — ABNORMAL LOW (ref 13.0–17.0)
O2 Saturation: 99 %
Potassium: 4.3 mmol/L (ref 3.5–5.1)
Sodium: 140 mmol/L (ref 135–145)
TCO2: 28 mmol/L (ref 22–32)
pCO2, Ven: 43.1 mmHg — ABNORMAL LOW (ref 44.0–60.0)
pH, Ven: 7.398 (ref 7.250–7.430)
pO2, Ven: 134 mmHg — ABNORMAL HIGH (ref 32.0–45.0)

## 2019-04-13 LAB — ECHOCARDIOGRAM LIMITED
Height: 66 in
Weight: 3520 oz

## 2019-04-13 LAB — CBG MONITORING, ED
Glucose-Capillary: 70 mg/dL (ref 70–99)
Glucose-Capillary: 48 mg/dL — ABNORMAL LOW (ref 70–99)
Glucose-Capillary: 85 mg/dL (ref 70–99)
Glucose-Capillary: 73 mg/dL (ref 70–99)

## 2019-04-13 LAB — CBC WITH DIFFERENTIAL/PLATELET
Abs Immature Granulocytes: 0.06 10*3/uL (ref 0.00–0.07)
Basophils Absolute: 0.1 10*3/uL (ref 0.0–0.1)
Basophils Relative: 0 %
Eosinophils Absolute: 0.1 10*3/uL (ref 0.0–0.5)
Eosinophils Relative: 0 %
HCT: 30.9 % — ABNORMAL LOW (ref 39.0–52.0)
Hemoglobin: 9.7 g/dL — ABNORMAL LOW (ref 13.0–17.0)
Immature Granulocytes: 0 %
Lymphocytes Relative: 8 %
Lymphs Abs: 1.2 10*3/uL (ref 0.7–4.0)
MCH: 26.5 pg (ref 26.0–34.0)
MCHC: 31.4 g/dL (ref 30.0–36.0)
MCV: 84.4 fL (ref 80.0–100.0)
Monocytes Absolute: 1.1 10*3/uL — ABNORMAL HIGH (ref 0.1–1.0)
Monocytes Relative: 7 %
Neutro Abs: 13 10*3/uL — ABNORMAL HIGH (ref 1.7–7.7)
Neutrophils Relative %: 85 %
Platelets: 340 10*3/uL (ref 150–400)
RBC: 3.66 MIL/uL — ABNORMAL LOW (ref 4.22–5.81)
RDW: 14.6 % (ref 11.5–15.5)
WBC: 15.5 10*3/uL — ABNORMAL HIGH (ref 4.0–10.5)
nRBC: 0 % (ref 0.0–0.2)

## 2019-04-13 LAB — COMPREHENSIVE METABOLIC PANEL
ALT: 10 U/L (ref 0–44)
AST: 20 U/L (ref 15–41)
Albumin: 2.4 g/dL — ABNORMAL LOW (ref 3.5–5.0)
Alkaline Phosphatase: 41 U/L (ref 38–126)
Anion gap: 12 (ref 5–15)
BUN: 55 mg/dL — ABNORMAL HIGH (ref 6–20)
CO2: 23 mmol/L (ref 22–32)
Calcium: 7.2 mg/dL — ABNORMAL LOW (ref 8.9–10.3)
Chloride: 101 mmol/L (ref 98–111)
Creatinine, Ser: 13.01 mg/dL — ABNORMAL HIGH (ref 0.61–1.24)
GFR calc Af Amer: 5 mL/min — ABNORMAL LOW (ref >60)
GFR calc non Af Amer: 4 mL/min — ABNORMAL LOW (ref >60)
Glucose, Bld: 109 mg/dL — ABNORMAL HIGH (ref 70–99)
Potassium: 5.2 mmol/L — ABNORMAL HIGH (ref 3.5–5.1)
Sodium: 136 mmol/L (ref 135–145)
Total Bilirubin: 0.5 mg/dL (ref 0.3–1.2)
Total Protein: 6 g/dL — ABNORMAL LOW (ref 6.5–8.1)

## 2019-04-13 LAB — PROCALCITONIN
Procalcitonin: 0.76 ng/mL
Procalcitonin: 1.2 ng/mL

## 2019-04-13 LAB — GLUCOSE, CAPILLARY
Glucose-Capillary: 110 mg/dL — ABNORMAL HIGH (ref 70–99)
Glucose-Capillary: 58 mg/dL — ABNORMAL LOW (ref 70–99)
Glucose-Capillary: 33 mg/dL — CL (ref 70–99)
Glucose-Capillary: 127 mg/dL — ABNORMAL HIGH (ref 70–99)
Glucose-Capillary: 87 mg/dL (ref 70–99)

## 2019-04-13 LAB — TROPONIN I (HIGH SENSITIVITY)
Troponin I (High Sensitivity): 63 ng/L — ABNORMAL HIGH (ref <18)
Troponin I (High Sensitivity): 101 ng/L (ref <18)
Troponin I (High Sensitivity): 153 ng/L (ref <18)

## 2019-04-13 LAB — SARS CORONAVIRUS 2 BY RT PCR (HOSPITAL ORDER, PERFORMED IN ~~LOC~~ HOSPITAL LAB): SARS Coronavirus 2: POSITIVE — AB

## 2019-04-13 LAB — LACTIC ACID, PLASMA
Lactic Acid, Venous: 2.1 mmol/L (ref 0.5–1.9)
Lactic Acid, Venous: 1.8 mmol/L (ref 0.5–1.9)

## 2019-04-13 LAB — BRAIN NATRIURETIC PEPTIDE: B Natriuretic Peptide: 977.7 pg/mL — ABNORMAL HIGH (ref 0.0–100.0)

## 2019-04-13 LAB — D-DIMER, QUANTITATIVE: D-Dimer, Quant: 1.63 ug/mL-FEU — ABNORMAL HIGH (ref 0.00–0.50)

## 2019-04-13 LAB — FERRITIN: Ferritin: 952 ng/mL — ABNORMAL HIGH (ref 24–336)

## 2019-04-13 LAB — C-REACTIVE PROTEIN: CRP: 7.1 mg/dL — ABNORMAL HIGH (ref <1.0)

## 2019-04-13 LAB — LACTATE DEHYDROGENASE: LDH: 375 U/L — ABNORMAL HIGH (ref 98–192)

## 2019-04-13 MED ORDER — FENTANYL 2500MCG IN NS 250ML (10MCG/ML) PREMIX INFUSION
50.0000 ug/h | INTRAVENOUS | Status: DC
  Administered 2019-04-14: 75 ug/h via INTRAVENOUS
  Filled 2019-04-13 (×2): qty 250

## 2019-04-13 MED ORDER — DEXTROSE 50 % IV SOLN
1.0000 | Freq: Once | INTRAVENOUS | Status: AC
  Administered 2019-04-13: 06:00:00 50 mL via INTRAVENOUS
  Filled 2019-04-13: qty 50

## 2019-04-13 MED ORDER — HEPARIN SODIUM (PORCINE) 1000 UNIT/ML IJ SOLN
INTRAMUSCULAR | Status: AC
  Filled 2019-04-13: qty 1

## 2019-04-13 MED ORDER — CHLORHEXIDINE GLUCONATE 0.12% ORAL RINSE (MEDLINE KIT)
15.0000 mL | Freq: Two times a day (BID) | OROMUCOSAL | Status: DC
  Administered 2019-04-13 – 2019-04-16 (×6): 15 mL via OROMUCOSAL

## 2019-04-13 MED ORDER — ONDANSETRON HCL 4 MG/2ML IJ SOLN: 4.0000 mg | Freq: Four times a day (QID) | INTRAMUSCULAR | Status: DC | PRN

## 2019-04-13 MED ORDER — SUCCINYLCHOLINE CHLORIDE 20 MG/ML IJ SOLN
INTRAMUSCULAR | Status: AC | PRN
  Administered 2019-04-13: 150 mg via INTRAVENOUS

## 2019-04-13 MED ORDER — PHENYLEPHRINE HCL-NACL 10-0.9 MG/250ML-% IV SOLN
0.0000 ug/min | INTRAVENOUS | Status: DC
  Administered 2019-04-13: 20 ug/min via INTRAVENOUS
  Administered 2019-04-14 (×3): 50 ug/min via INTRAVENOUS
  Filled 2019-04-13 (×7): qty 250

## 2019-04-13 MED ORDER — FENTANYL CITRATE (PF) 100 MCG/2ML IJ SOLN: 50.0000 ug | Freq: Once | INTRAMUSCULAR | Status: DC

## 2019-04-13 MED ORDER — SODIUM CHLORIDE 0.9 % IV BOLUS (SEPSIS): 1000.0000 mL | Freq: Once | INTRAVENOUS | Status: DC

## 2019-04-13 MED ORDER — VANCOMYCIN HCL IN DEXTROSE 1-5 GM/200ML-% IV SOLN
1000.0000 mg | Freq: Once | INTRAVENOUS | Status: DC
  Filled 2019-04-13: qty 200

## 2019-04-13 MED ORDER — PROPOFOL 1000 MG/100ML IV EMUL
5.0000 ug/kg/min | INTRAVENOUS | Status: DC
  Administered 2019-04-13: 08:00:00 40 ug/kg/min via INTRAVENOUS
  Filled 2019-04-13: qty 100

## 2019-04-13 MED ORDER — MIDAZOLAM BOLUS VIA INFUSION
1.0000 mg | INTRAVENOUS | Status: DC | PRN
  Administered 2019-04-13: 1 mg via INTRAVENOUS
  Filled 2019-04-13: qty 2

## 2019-04-13 MED ORDER — MIDAZOLAM HCL 2 MG/2ML IJ SOLN: 2.0000 mg | INTRAMUSCULAR | Status: DC | PRN

## 2019-04-13 MED ORDER — DEXTROSE 50 % IV SOLN
INTRAVENOUS | Status: AC
  Administered 2019-04-13: 50 mL via INTRAVENOUS
  Filled 2019-04-13: qty 50

## 2019-04-13 MED ORDER — DEXAMETHASONE SODIUM PHOSPHATE 10 MG/ML IJ SOLN
6.0000 mg | INTRAMUSCULAR | Status: DC
  Administered 2019-04-13 – 2019-04-15 (×3): 6 mg via INTRAVENOUS
  Filled 2019-04-13 (×3): qty 1

## 2019-04-13 MED ORDER — ETOMIDATE 2 MG/ML IV SOLN: 0.3000 mg/kg | Freq: Once | INTRAVENOUS | Status: DC

## 2019-04-13 MED ORDER — HEPARIN SODIUM (PORCINE) 10000 UNIT/ML IJ SOLN
10000.0000 [IU] | Freq: Three times a day (TID) | INTRAMUSCULAR | Status: DC
  Administered 2019-04-13: 10000 [IU] via SUBCUTANEOUS
  Administered 2019-04-13: 5000 [IU] via SUBCUTANEOUS
  Administered 2019-04-14 – 2019-04-18 (×13): 10000 [IU] via SUBCUTANEOUS
  Filled 2019-04-13 (×17): qty 1

## 2019-04-13 MED ORDER — SODIUM CHLORIDE 0.9 % IV BOLUS (SEPSIS)
1000.0000 mL | Freq: Once | INTRAVENOUS | Status: AC
  Administered 2019-04-13: 06:00:00 1000 mL via INTRAVENOUS

## 2019-04-13 MED ORDER — DEXTROSE 10 % IV SOLN
INTRAVENOUS | Status: DC
  Administered 2019-04-13: 21:00:00 via INTRAVENOUS

## 2019-04-13 MED ORDER — DEXTROSE 10 % IV SOLN
INTRAVENOUS | Status: DC
  Administered 2019-04-13: 07:00:00 via INTRAVENOUS

## 2019-04-13 MED ORDER — VANCOMYCIN HCL 10 G IV SOLR
2000.0000 mg | Freq: Once | INTRAVENOUS | Status: DC
  Filled 2019-04-13: qty 2000

## 2019-04-13 MED ORDER — FENTANYL 2500MCG IN NS 250ML (10MCG/ML) PREMIX INFUSION
0.0000 ug/h | INTRAVENOUS | Status: DC
  Administered 2019-04-13: 100 ug/h via INTRAVENOUS
  Filled 2019-04-13: qty 250

## 2019-04-13 MED ORDER — SODIUM CHLORIDE 0.9 % IV SOLN
2.0000 g | Freq: Once | INTRAVENOUS | Status: AC
  Administered 2019-04-13: 06:00:00 2 g via INTRAVENOUS
  Filled 2019-04-13: qty 2

## 2019-04-13 MED ORDER — FENTANYL CITRATE (PF) 100 MCG/2ML IJ SOLN
INTRAMUSCULAR | Status: AC
  Administered 2019-04-13: 08:00:00 100 ug via INTRAVENOUS
  Filled 2019-04-13: qty 2

## 2019-04-13 MED ORDER — SODIUM CHLORIDE 0.9 % IV SOLN
100.0000 mg | INTRAVENOUS | Status: DC
  Filled 2019-04-13: qty 20

## 2019-04-13 MED ORDER — VANCOMYCIN HCL IN DEXTROSE 1-5 GM/200ML-% IV SOLN
1000.0000 mg | Freq: Once | INTRAVENOUS | Status: AC
  Administered 2019-04-13: 16:00:00 1000 mg via INTRAVENOUS
  Filled 2019-04-13: qty 200

## 2019-04-13 MED ORDER — SUCCINYLCHOLINE CHLORIDE 20 MG/ML IJ SOLN
1.5000 mg/kg | Freq: Once | INTRAMUSCULAR | Status: DC
  Filled 2019-04-13: qty 7.49

## 2019-04-13 MED ORDER — VANCOMYCIN HCL IN DEXTROSE 1-5 GM/200ML-% IV SOLN: 1000.0000 mg | INTRAVENOUS | Status: DC

## 2019-04-13 MED ORDER — SODIUM CHLORIDE 0.9 % IV SOLN
500.0000 mg | Freq: Once | INTRAVENOUS | Status: DC
  Filled 2019-04-13: qty 500

## 2019-04-13 MED ORDER — SODIUM CHLORIDE 0.9 % IV SOLN
1.0000 g | INTRAVENOUS | Status: DC
  Administered 2019-04-14 – 2019-04-16 (×3): 1 g via INTRAVENOUS
  Filled 2019-04-13 (×3): qty 1

## 2019-04-13 MED ORDER — MIDAZOLAM 50MG/50ML (1MG/ML) PREMIX INFUSION
0.0000 mg/h | INTRAVENOUS | Status: DC
  Administered 2019-04-13: 1 mg/h via INTRAVENOUS
  Filled 2019-04-13 (×2): qty 50

## 2019-04-13 MED ORDER — SODIUM CHLORIDE 0.9 % IV SOLN
100.0000 mg | Freq: Two times a day (BID) | INTRAVENOUS | Status: DC
  Administered 2019-04-13 – 2019-04-16 (×7): 100 mg via INTRAVENOUS
  Filled 2019-04-13 (×8): qty 100

## 2019-04-13 MED ORDER — CHLORHEXIDINE GLUCONATE CLOTH 2 % EX PADS
6.0000 | MEDICATED_PAD | Freq: Every day | CUTANEOUS | Status: DC
  Administered 2019-04-13 – 2019-04-14 (×2): 6 via TOPICAL

## 2019-04-13 MED ORDER — PROPOFOL 1000 MG/100ML IV EMUL
INTRAVENOUS | Status: AC | PRN
  Administered 2019-04-13: 10 ug via INTRAVENOUS

## 2019-04-13 MED ORDER — DEXTROSE 50 % IV SOLN
1.0000 | Freq: Once | INTRAVENOUS | Status: AC
  Administered 2019-04-13: 11:00:00 50 mL via INTRAVENOUS

## 2019-04-13 MED ORDER — CHLORHEXIDINE GLUCONATE CLOTH 2 % EX PADS
6.0000 | MEDICATED_PAD | Freq: Every day | CUTANEOUS | Status: DC
  Administered 2019-04-14 – 2019-04-15 (×2): 6 via TOPICAL

## 2019-04-13 MED ORDER — FENTANYL CITRATE (PF) 100 MCG/2ML IJ SOLN
100.0000 ug | INTRAMUSCULAR | Status: DC | PRN
  Administered 2019-04-13 (×2): 100 ug via INTRAVENOUS
  Filled 2019-04-13: qty 2

## 2019-04-13 MED ORDER — PANTOPRAZOLE SODIUM 40 MG IV SOLR
40.0000 mg | INTRAVENOUS | Status: DC
  Administered 2019-04-13 – 2019-04-16 (×4): 40 mg via INTRAVENOUS
  Filled 2019-04-13 (×4): qty 40

## 2019-04-13 MED ORDER — SODIUM CHLORIDE 0.9 % IV SOLN: 2.0000 g | INTRAVENOUS | Status: DC

## 2019-04-13 MED ORDER — ORAL CARE MOUTH RINSE
15.0000 mL | OROMUCOSAL | Status: DC
  Administered 2019-04-13 – 2019-04-15 (×18): 15 mL via OROMUCOSAL

## 2019-04-13 MED ORDER — ALBUMIN HUMAN 25 % IV SOLN
12.5000 g | Freq: Once | INTRAVENOUS | Status: AC
  Administered 2019-04-13: 16:00:00 12.5 g via INTRAVENOUS
  Filled 2019-04-13: qty 50

## 2019-04-13 MED ORDER — FENTANYL BOLUS VIA INFUSION
50.0000 ug | INTRAVENOUS | Status: DC | PRN
  Administered 2019-04-13 – 2019-04-14 (×2): 50 ug via INTRAVENOUS
  Filled 2019-04-13: qty 50

## 2019-04-13 MED ORDER — SODIUM CHLORIDE 0.9 % IV SOLN: INTRAVENOUS | Status: DC | PRN

## 2019-04-13 MED ORDER — DEXTROSE 50 % IV SOLN
INTRAVENOUS | Status: AC
  Administered 2019-04-13: 50 mL
  Filled 2019-04-13: qty 50

## 2019-04-13 MED ORDER — SODIUM CHLORIDE 0.9 % IV SOLN
200.0000 mg | Freq: Once | INTRAVENOUS | Status: AC
  Administered 2019-04-13: 200 mg via INTRAVENOUS
  Filled 2019-04-13: qty 40

## 2019-04-13 MED ORDER — MIDAZOLAM HCL 2 MG/2ML IJ SOLN
2.0000 mg | INTRAMUSCULAR | Status: DC | PRN
  Administered 2019-04-13 – 2019-04-14 (×2): 2 mg via INTRAVENOUS
  Filled 2019-04-13 (×3): qty 2

## 2019-04-13 MED ORDER — ETOMIDATE 2 MG/ML IV SOLN
INTRAVENOUS | Status: AC | PRN
  Administered 2019-04-13: 30 mg via INTRAVENOUS

## 2019-04-13 MED ORDER — VANCOMYCIN HCL IN DEXTROSE 1-5 GM/200ML-% IV SOLN
1000.0000 mg | INTRAVENOUS | Status: AC
  Administered 2019-04-13: 06:00:00 1000 mg via INTRAVENOUS

## 2019-04-13 MED ORDER — MIDAZOLAM 50MG/50ML (1MG/ML) PREMIX INFUSION: 0.5000 mg/h | INTRAVENOUS | Status: DC

## 2019-04-13 MED ORDER — ROCURONIUM BROMIDE 50 MG/5ML IV SOLN
INTRAVENOUS | Status: DC | PRN
  Administered 2019-04-13: 100 mg via INTRAVENOUS

## 2019-04-13 MED ORDER — ROCURONIUM BROMIDE 50 MG/5ML IV SOLN
1.0000 mg/kg | Freq: Once | INTRAVENOUS | Status: DC
  Filled 2019-04-13: qty 9.98

## 2019-04-13 MED ORDER — ENOXAPARIN SODIUM 30 MG/0.3ML ~~LOC~~ SOLN: 30.0000 mg | SUBCUTANEOUS | Status: DC

## 2019-04-13 MED ORDER — MIDAZOLAM HCL 2 MG/2ML IJ SOLN
2.0000 mg | Freq: Once | INTRAMUSCULAR | Status: AC
  Administered 2019-04-13: 2 mg via INTRAVENOUS

## 2019-04-13 MED ORDER — ACETAMINOPHEN 325 MG PO TABS: 650.0000 mg | ORAL_TABLET | ORAL | Status: DC | PRN

## 2019-04-13 MED ORDER — SODIUM CHLORIDE 0.9 % IV SOLN: 500.0000 mg | INTRAVENOUS | Status: DC

## 2019-04-13 NOTE — ED Notes (Signed)
XR confirm tube placement

## 2019-04-13 NOTE — ED Triage Notes (Signed)
Pt arrives by EMS with complaints of SOB X2 days. Endorses cough X2 days. Dry non -productive. Pt SpO2 76 RA. Placed on [place] with improvement to 85%. NRB in route at 96%. When EMS arrives CBG was 41. Gave OJ and PB with CBG increase to 85. Pt HD MWF. Pt placed on 5liter  @92 % CBG 70

## 2019-04-13 NOTE — Progress Notes (Signed)
Renal Navigator notes patient's admission to ICU and COVID positive test on 04/13/19. Patient's home OP HD clinic is SW. Patient received OP HD treatment at [facility] clinic for [place] isolation shift at discharge from hospitalization on 9/18, however, per [facility] clinic manager, had since been repatriated back to home clinic. Renal Navigator has notified both patient's home clinic/SW and COVID isolation clinic/[facility] of patient's positive COVID test. Patient will need to return to [employer] isolation shift, which is TTS 12:00pm (arrival to parking lot at 11:45am) upon discharge for OP HD treatment.  James Brown, [place] Renal Navigator [phone]

## 2019-04-13 NOTE — ED Provider Notes (Addendum)
TIME SEEN: 5:17 AM  CHIEF COMPLAINT: Shortness of breath  HPI: Patient is a 53 year old male with history of non-insulin-dependent diabetes, hypertension, hyperlipidemia, end-stage renal disease on hemodialysis Monday, Wednesday and Friday followed by Dr. Jimmy Brown who presents to the emergency department with shortness of breath.  States this is on going since "the weather changed" 3 to 4 days ago.  He denies any chest pain or chest discomfort.  No fever or chills.  No nausea, vomiting or diarrhea.  Has had a cough with clear sputum production.  Was admitted to the hospital on 03/08/2019 -03/13/2019 for respiratory failure secondary to COVID-19 infection.  Did not require intubation.  States he had been feeling better until the last few days.  He states he has had at least 3 negative tests for COVID 19 since this admission.  Denies any sick contacts or recent travel.  He has not missed any dialysis.  Was last dialyzed on Friday, October 16.  He is due for dialysis this morning.  He does not wear oxygen at home.  Sats were in the 70s on room air upon EMS arrival.  Placed on nonrebreather by EMS.  Currently on 5 L nasal cannula.  He denies being a smoker.  No history of COPD, asthma, CHF, PE, DVT.  No lower extremity swelling or pain.  He states since discharge on 03/13/19 he has had three negative COVID tests at dialysis at [facility].  Patient also found to have a glucose of 41 at home by EMS.  He is on glimepiride.  Not on insulin.  Was hypoglycemic during his last admission.  Given peanut butter and orange juice at home and blood glucose improved to the 80s.  It is now 50.   Son James Brown - [phone]  ROS: See HPI Constitutional: no fever  Eyes: no drainage  ENT: no runny nose   Cardiovascular:  no chest pain  Resp:  + SOB  GI: no vomiting GU: no dysuria Integumentary: no rash  Allergy: no hives  Musculoskeletal: no leg swelling  Neurological: no slurred speech ROS otherwise negative  PAST  MEDICAL HISTORY/PAST SURGICAL HISTORY:  Past Medical History:  Diagnosis Date  . AAA (abdominal aortic aneurysm) ([place])   . Constipation   . Dermatitis   . Diabetes ([place])    boderline  . ESRD (end stage renal disease) on dialysis [facility])    started 09/2006 MWF [facility],   [facility]  . Hyperlipidemia   . Hypertension   . Tinea versicolor     MEDICATIONS:  Prior to Admission medications   Medication Sig Start Date End Date Taking? Authorizing Provider  atorvastatin (LIPITOR) 20 MG tablet Take 20 mg by mouth daily.    [provider]  Calcium Acetate 667 MG TABS Take 1 tablet by mouth daily.    [provider]  calcium carbonate (TUMS) 500 MG chewable tablet Chew 1 tablet by mouth 3 (three) times daily as needed for indigestion.     [provider]  fenofibrate 160 MG tablet Take 160 mg by mouth daily.    [provider]  glimepiride (AMARYL) 1 MG tablet Take 2 mg by mouth daily with breakfast.    [provider]  midodrine (PROAMATINE) 10 MG tablet Take 10 mg by mouth 3 (three) times daily.    [provider]    ALLERGIES:  Allergies  Allergen Reactions  . Aplisol [Tuberculin] Swelling    SOCIAL HISTORY:  Social History   Tobacco Use  .  Smoking status: Never Smoker  . Smokeless tobacco: Never Used  Substance Use Topics  . Alcohol use: No    Alcohol/week: 0.0 standard drinks    FAMILY HISTORY: Family History  Adopted: Yes    EXAM: BP (!) 157/64 (BP Location: Right Arm)   Pulse (!) 123   Resp (!) 48   SpO2 98%  CONSTITUTIONAL: Alert and oriented and responds appropriately to questions.  In mild respiratory distress HEAD: Normocephalic EYES: Conjunctivae clear, pupils appear equal, EOMI ENT: normal nose; moist mucous membranes NECK: Supple, no meningismus, no nuchal rigidity, no LAD  CARD: Regular and tachycardic; S1 and S2 appreciated; no murmurs, no clicks, no rubs, no gallops RESP: Patient is tachypneic.  He has  scattered rhonchorous breath sounds heard diffusely but worse at the right lower lobe.  No wheezing or rales.  He has hypoxic on room air and is currently satting 98% on 5 L nasal cannula.  He is able to speak full sentences but does have increased work of breathing and appears short of breath. ABD/GI: Normal bowel sounds; non-distended; soft, non-tender, no rebound, no guarding, no peritoneal signs, no hepatosplenomegaly BACK:  The back appears normal and is non-tender to palpation, there is no CVA tenderness EXT: Normal ROM in all joints; non-tender to palpation; no edema; normal capillary refill; no cyanosis, no calf tenderness or swelling; dialysis fistula in LUE with good thrill and no redness, warmth, bleeding, tenderness; 2+ left radial pulse   SKIN: Normal color for age and race; warm; no rash NEURO: Moves all extremities equally PSYCH: The patient's mood and manner are appropriate. Grooming and personal hygiene are appropriate.  MEDICAL DECISION MAKING: Patient here with shortness of breath.  Differential includes pulmonary edema, pneumonia, PE, pneumothorax, COVID-19.  He denies any chest pain.  Low suspicion for ACS or dissection.  He has a history of AAA but has no complaints of abdominal pain or back pain.  Will obtain labs, chest x-ray, Covid swab.  EKG shows sinus tachycardia without ischemic abnormality.  Patient will need admission given his new oxygen requirement.  ED PROGRESS: 5:25 AM  Pt has had rapid deterioration and is now breathing 50-60 times a minute. Sats still in the high 90s on 5-6 L [place].   He is still able to speak to me and would like to try BiPAP before intubation. We will send rapid Covid swab as he may need intubation and ICU.  Patient and son updated with plan.  Will obtain ABG.    Discussed possibility of intubation with patient versus BiPAP.  They would prefer BiPAP.  His chest x-ray is concerning for multifocal pneumonia but may be bacterial.  He did have COVID-19 1  month ago but has had multiple negative tests since that time per his report (not seen in our results in Epic).  I think it is very unlikely that he has reinfection but is a possibility.  He would prefer not to be intubated at this time.  Discussed risk and benefits with patient and son.  Will start BiPAP and reassess.  Patient placed in negative pressure room.  Staff instructed to wear N95s and patient on precautions.  I do not feel high flow nasal cannula is appropriate for this patient.  I feel he needs a higher level of positive pressure ventilation.  6:20 AM  Pt states that he is feeling much better.  He is now breathing 25 times a minute instead of 65 times a minute.  His heart rate is  in the low 100s.  His blood gas is reassuring.  Patient's blood glucose did drop again to 48.  Given an amp of D50 and will start D10 infusion.   7:50 AM  Pt's COVID-19 test is positive.  He was positive in mid-September and then reports 3 negative tests after his discharge. Possible reinfection versus continued positive test from previous infection.  Given patient is Covid positive decision was made to take him off of BiPAP.  We discussed that he would not be able to be admitted on bipap while COVID positive.  Immediately after coming off BiPAP patient desaturated into the 80s despite being on nasal cannula 6 L and had immediate increased work of breathing with respiratory rates in the 50s to 60s.  Patient was transitioned to nonrebreather and then intubated.  After intubation patient required significant sedation and paralysis to bring oxygen saturations into the 90s.  PEEP is 16 and FiO2 of 100%.  Will repeat ABG.  Patient did receive approximately 500 mL of IV fluids between antibiotics and dextrose infusion.  He did not receive IVF bolus.  That was ordered incorrectly.  Now having some frothy pink sputum production.  Sedated with propofol infusion and as needed IV fentanyl.  Intubation performed by Dr. Milford Cage, PGY3  EM resident.  I was at bedside throughout procedure.  Will admit to ICU.  7:55 AM  Spoke with Dr. Jonnie Finner with nephrology.  They will coordinate dialysis for patient.  Appreciate nephrology help.  8:07 AM Discussed patient's case with CCM attending, Dr. Patsey Berthold.  I have recommended admission and patient (and family if present) agree with this plan. Admitting physician will place admission orders. Appreciate CCM help.  I reviewed all nursing notes, vitals, pertinent previous records, EKGs, lab and urine results, imaging (as available).   8:11 AM  Updated patient's son James Brown by phone.  He is aware that he will need to quarantine for 14 days as well any recent close contacts.     EKG Interpretation  Date/Time:  Monday April 13 2019 04:44:48 EDT Ventricular Rate:  123 PR Interval:    QRS Duration: 79 QT Interval:  345 QTC Calculation: 494 R Axis:   -30 Text Interpretation:  Sinus tachycardia Ventricular premature complex Aberrant conduction of SV complex(es) Left axis deviation Probable anteroseptal infarct, old Borderline repolarization abnormality Rate faster than precious Confirmed by Pryor Curia [phone]) on 04/13/2019 5:09:45 AM       CRITICAL CARE Performed by: Cyril Mourning Tabrina Esty   Total critical care time: 75 minutes  Critical care time was exclusive of separately billable procedures and treating other patients.  Critical care was necessary to treat or prevent imminent or life-threatening deterioration.  Critical care was time spent personally by me on the following activities: development of treatment plan with patient and/or surrogate as well as nursing, discussions with consultants, evaluation of patient's response to treatment, examination of patient, obtaining history from patient or surrogate, ordering and performing treatments and interventions, ordering and review of laboratory studies, ordering and review of radiographic studies, pulse oximetry and re-evaluation of  patient's condition.   James Brown was evaluated in Emergency Department on 04/13/2019 for the symptoms described in the history of present illness. He was evaluated in the context of the global COVID-19 pandemic, which necessitated consideration that the patient might be at risk for infection with the SARS-CoV-2 virus that causes COVID-19. Institutional protocols and algorithms that pertain to the evaluation of patients at risk for COVID-19 are in a state of rapid  change based on information released by regulatory bodies including the CDC and federal and state organizations. These policies and algorithms were followed during the patient's care in the ED.       Christipher Rieger, Delice Bison, DO 04/13/19 0932    Eddith Mentor, Delice Bison, DO 04/13/19 6720   Addendum:  Patient not given 30 ml/kg IVF bolus for sepsis due to signs of becoming volume overloaded and having COVID 19  And being in HD for 18 years.   Leesha Veno, Delice Bison, DO 04/14/19 1013

## 2019-04-13 NOTE — Progress Notes (Signed)
Pharmacy Antibiotic Note  James Brown is a 53 y.o. male admitted on 04/13/2019 with sepsis.  Pharmacy has been consulted for Cefepime and Vancomycin dosing.  Height: 5\' 6"  (167.6 cm) Weight: 220 lb (99.8 kg) IBW/kg (Calculated) : 63.8  Temp (24hrs), Avg:99.8 F (37.7 C), Min:99.8 F (37.7 C), Max:99.8 F (37.7 C)  Recent Labs  Lab 04/13/19 0503 04/13/19 0519 04/13/19 0825  WBC 15.5*  --   --   CREATININE 12.02*  --   --   LATICACIDVEN  --  2.1* 1.8    Estimated Creatinine Clearance: 8 mL/min (A) (by C-G formula based on SCr of 12.02 mg/dL (H)).    Allergies  Allergen Reactions  . Aplisol [Tuberculin] Swelling    Antimicrobials this admission: 10/19 Cefepime >>  10/19 Vancomycin >>    Microbiology results: 10/19 BCx: pending 10/19 COVID +  Plan: - Will start patient on Cefepime 2 grams IV with HD on MWF - Patient received 1 gram of Vancomycin in the ED prior to consult. Will order an additional dose of Vancomycin 1g x 1 dose for a total loading dose of 2 grams  - Will continue vancomycin dosing as Vancomycin 1000 mg IV every MWF with HD.  - Will continue to watch for patient's HD schedule to change.    Thank you for allowing pharmacy to be a part of this patient's care.  Duanne Limerick PharmD. BCPS  04/13/2019 9:48 AM

## 2019-04-13 NOTE — Procedures (Signed)
   I was present at this dialysis session, have reviewed the session itself and made  appropriate changes Kelly Splinter MD [place] pager [phone]   04/13/2019, 4:50 PM

## 2019-04-13 NOTE — ED Provider Notes (Signed)
Procedure Name: Intubation Date/Time: 04/13/2019 7:53 AM Performed by: Romona Curls, MD Pre-anesthesia Checklist: Patient identified, Emergency Drugs available, Suction available and Patient being monitored Oxygen Delivery Method: Non-rebreather mask Preoxygenation: Pre-oxygenation with 100% oxygen Induction Type: Rapid sequence Ventilation: Mask ventilation without difficulty Laryngoscope Size: Mac and 3 Grade View: Grade II Tube size: 7.5 mm Number of attempts: 1 Airway Equipment and Method: Rigid stylet Placement Confirmation: ETT inserted through vocal cords under direct vision,  Breath sounds checked- equal and bilateral and CO2 detector Secured at: 25 cm Tube secured with: ETT holder         Romona Curls, MD 04/13/19 [employer], Delice Bison, DO 04/13/19 0820

## 2019-04-13 NOTE — ED Notes (Signed)
XR called.

## 2019-04-13 NOTE — Progress Notes (Signed)
Post intubation ABG obtained on settings of VT: 500, RR: 20, FIO2: 100%, PEEP: 16.  Increased VT to 510 (patient's 8cc) and RR to 24.  Decreased FIO2 to 70% and PEEP to 14 as with ARDS protocol.  Sats stable at 100% at this time.  Will continue to monitor.    Ref. Range 04/13/2019 08:47  Sample type Unknown ARTERIAL  pH, Arterial Latest Ref Range: 7.350 - 7.450  7.305 (L)  pCO2 arterial Latest Ref Range: 32.0 - 48.0 mmHg 54.9 (H)  pO2, Arterial Latest Ref Range: 83.0 - 108.0 mmHg 255.0 (H)  TCO2 Latest Ref Range: 22 - 32 mmol/L 29  Bicarbonate Latest Ref Range: 20.0 - 28.0 mmol/L 27.2  O2 Saturation Latest Units: % 100.0  Patient temperature Unknown 99.8 F  Collection site Unknown RADIAL, ALLEN'S TEST ACCEPTABLE

## 2019-04-13 NOTE — Progress Notes (Signed)
Patient transported from ED to room 5Z96 without complications.

## 2019-04-13 NOTE — Progress Notes (Signed)
Placed pt on bipap per Dr. Leonides Schanz.

## 2019-04-13 NOTE — ED Notes (Addendum)
Family at bedside. 

## 2019-04-13 NOTE — Progress Notes (Signed)
Pharmacy Antibiotic Note  James Brown is a 53 y.o. male admitted on 04/13/2019 with sepsis.  Pharmacy has been consulted for Cefepime and Vancomycin dosing. Azithromycin changed to doxy with borderline QTc. ESRD on HD MWF - no missed sessions reported PTA. Renal consult pending.  Plan: Vancomycin 1g IV x 1 to complete 2g IV total load; then 1g IV qHD Change cefepime to 1g IV q24h for now until Renal plans inpatient confirmed Doxy 100mg  IV q12h per MD Monitor clinical progress, c/s, abx plan/LOT Pre-HD vancomycin level as indicated F/u HD schedule/tolerance inpatient for antibiotic maintenance doses   Height: 5\' 6"  (167.6 cm) Weight: 220 lb (99.8 kg) IBW/kg (Calculated) : 63.8  Temp (24hrs), Avg:99.8 F (37.7 C), Min:99.8 F (37.7 C), Max:99.8 F (37.7 C)  Recent Labs  Lab 04/13/19 0503 04/13/19 0519 04/13/19 0825  WBC 15.5*  --   --   CREATININE 12.02*  --   --   LATICACIDVEN  --  2.1* 1.8    Estimated Creatinine Clearance: 8 mL/min (A) (by C-G formula based on SCr of 12.02 mg/dL (H)).    Allergies  Allergen Reactions  . Aplisol [Tuberculin] Swelling    Antimicrobials this admission: 10/19 Cefepime >>  10/19 Vancomycin >>  10/19 doxy >>  Microbiology results: 10/19 BCx: pending 10/19 COVID +  Elicia Lamp, PharmD, BCPS Please check AMION for all [place] contact numbers Clinical Pharmacist 04/13/2019 10:48 AM

## 2019-04-13 NOTE — Progress Notes (Addendum)
Received ABG results from [employer], RRT earlier.  He did not get a chance to change ventilator settings.  Changed ventilator settings to 40% FIO2, PEEP 8, RR 26, per ABG results of  Results for [facility] (MRN [id number]) as of 04/13/2019 20:54  Ref. Range 04/13/2019 18:14  pH, Arterial Latest Ref Range: 7.350 - 7.450  7.468 (H)  pCO2 arterial Latest Ref Range: 32.0 - 48.0 mmHg 40.4  pO2, Arterial Latest Ref Range: 83.0 - 108.0 mmHg 160 (H)  Acid-Base Excess Latest Ref Range: 0.0 - 2.0 mmol/L 5.1 (H)  Bicarbonate Latest Ref Range: 20.0 - 28.0 mmol/L 28.8 (H)  O2 Saturation Latest Units: % 99.6  Patient temperature Unknown 99.4  Collection site Unknown A-LINE   Will continue to monitor patient, no distress noted at this time.

## 2019-04-13 NOTE — Procedures (Signed)
Arterial Catheter Insertion Procedure Note James Brown [id number] 08/12/1965  Procedure: Insertion of Arterial Catheter  Indications: Blood pressure monitoring and Frequent blood sampling  Procedure Details Consent: Unable to obtain consent because of patient sedated on the ventilator. Time Out: Verified patient identification, verified procedure, site/side was marked, verified correct patient position, special equipment/implants available, medications/allergies/relevent history reviewed, required imaging and test results available.  Performed  Maximum sterile technique was used including antiseptics, cap, gloves, gown, hand hygiene, mask and sheet. Skin prep: Chlorhexidine; local anesthetic administered 20 gauge catheter was inserted into right radial artery using the Seldinger technique. ULTRASOUND GUIDANCE USED: NO Evaluation Blood flow good; BP tracing good. Complications: No apparent complications.   Kathie Dike 04/13/2019

## 2019-04-13 NOTE — Consult Note (Addendum)
NAME:  James Brown, MRN:  [id number], DOB:  1965/12/09, LOS: 0 ADMISSION DATE:  04/13/2019, CONSULTATION DATE:  04/13/19 REFERRING MD:  [name], CHIEF COMPLAINT:  Shortness of breath    Brief History   53 year old man CKD on HD, Dm, HLD, here with hypoxemic resp failure.   History of present illness   SOB and cough x 2 days. Clear sputum production Admitted to ED, sat 76 % on RA.  96% on RA.  Hypoglycemic.    PER ED note:  Was admitted to the hospital on 03/08/2019 -03/13/2019 for respiratory failure secondary to COVID-19 infection.  Did not require intubation.  States he had been feeling better until the last few days.  He states he has had at least 3 negative tests for COVID 19 since this admission.  Denies any sick contacts or recent travel.  He has not missed any dialysis.  Was last dialyzed on Friday, October 16.  He is due for dialysis this morning.  He does not wear oxygen at home.  Sats were in the 70s on room air upon EMS arrival.  Placed on nonrebreather by EMS.  Currently on 5 L nasal cannula.  He denies being a smoker.  No history of COPD, asthma, CHF, PE, DVT.  No lower extremity swelling or pain. He states since discharge on 03/13/19 he has had three negative COVID tests at dialysis at [facility].  IN ED:  "We discussed that he would not be able to be admitted on bipap while COVID positive.  Immediately after coming off BiPAP patient desaturated into the 80s despite being on nasal cannula 6 L and had immediate increased work of breathing with respiratory rates in the 50s to 60s.  Patient was transitioned to nonrebreather and then intubated.  After intubation patient required significant sedation and paralysis to bring oxygen saturations into the 90s.  PEEP is 16 and FiO2 of 100%.  Will repeat ABG.  Patient did receive approximately 500 mL of IV fluids between antibiotics and dextrose infusion. 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 No meds and on  midodrine per chart)  Lipitor, fenofibrate, glimeprimide, midodrine TID  Significant Hospital Events      Consults:    Procedures:   10/19 Intubated  Significant Diagnostic Tests:  CXR: dense B infiltrates   Micro Data:  Bl cultures pending 10/19  Antimicrobials:  vanc  Cefepime Azithro  Interim history/subjective:    Objective   Blood pressure 102/67, pulse (!) 109, temperature 99.8 F (37.7 C), temperature source Rectal, resp. rate (!) 24, height 5\' 6"  (1.676 m), weight 99.8 kg, SpO2 99 %.    Vent Mode: PRVC FiO2 (%):  [50 %-100 %] 70 % Set Rate:  [15 bmp-24 bmp] 24 bmp Vt Set:  [500 mL-510 mL] 510 mL PEEP:  [6 cmH20-16 cmH20] 14 cmH20 Pressure Support:  [8 cmH20] 8 cmH20 Plateau Pressure:  [34 cmH20] 34 cmH20   Intake/Output Summary (Last 24 hours) at 04/13/2019 0940 Last data filed at 04/13/2019 0702 Gross per 24 hour  Intake 600 ml  Output -  Net 600 ml   Filed Weights   04/13/19 0517  Weight: 99.8 kg    Examination: General: NAD, intubated (had been given paralytic at 175) HENT: NCAT, intubated  Lungs: Diminished, crackles at bases   Cardiovascular: Sinus tach, no mgr Abdomen: NT, ND, NBC  Extremities: no edema, AV fistula on RUE Neuro: sedated, paralyzed  Resolved Hospital Problem list     Assessment & Plan:  Hypoxemic respiratory failure, ARDS Vent per ards protocol.   TV 6-8ML/KG as tolerates.  Goal Sat >88% Ph >7.3 Pplat goal <30, Driving pressure <09 Prone 18 hrs per day as tolerates.   Echo pending Trop minimally elevated.    CKD - HD MWF, needs HD today stat for volume removal.   Hypoglycemia: q 1 hr BS.  Avoid additional IV fluids   Best practice:  Diet: start TF tomorrow or when more stable Pain/Anxiety/Delirium protocol (if indicated):  fent versed  VAP protocol (if indicated): yes DVT prophylaxis: lovenox will dose based on  GI prophylaxis: ppi Glucose control: will to SSI when needed Mobility: bed rest Code  Status: full Family Communication: Updated son [name], who will be the main contact.   Disposition: ICU  Labs   CBC: Recent Labs  Lab 04/13/19 0503 04/13/19 0535 04/13/19 0600 04/13/19 0847  WBC 15.5*  --   --   --   NEUTROABS 13.0*  --   --   --   HGB 9.7* 10.2* 9.5* 9.5*  HCT 30.9* 30.0* 28.0* 28.0*  MCV 84.4  --   --   --   PLT 340  --   --   --     Basic Metabolic Panel: Recent Labs  Lab 04/13/19 0503 04/13/19 0535 04/13/19 0600 04/13/19 0847  NA 140 140 140 138  K 4.0 4.3 4.2 4.8  CL 99  --   --   --   CO2 24  --   --   --   GLUCOSE 75  --   --   --   BUN 53*  --   --   --   CREATININE 12.02*  --   --   --   CALCIUM 8.0*  --   --   --    GFR: Estimated Creatinine Clearance: 8 mL/min (A) (by C-G formula based on SCr of 12.02 mg/dL (H)). Recent Labs  Lab 04/13/19 0503 04/13/19 0519 04/13/19 0825  WBC 15.5*  --   --   LATICACIDVEN  --  2.1* 1.8    Liver Function Tests: No results for input(s): AST, ALT, ALKPHOS, BILITOT, PROT, ALBUMIN in the last 168 hours. No results for input(s): LIPASE, AMYLASE in the last 168 hours. No results for input(s): AMMONIA in the last 168 hours.  ABG    Component Value Date/Time   PHART 7.305 (L) 04/13/2019 0847   PCO2ART 54.9 (H) 04/13/2019 0847   PO2ART 255.0 (H) 04/13/2019 0847   HCO3 27.2 04/13/2019 0847   TCO2 29 04/13/2019 0847   O2SAT 100.0 04/13/2019 0847     Coagulation Profile: No results for input(s): INR, PROTIME in the last 168 hours.  Cardiac Enzymes: No results for input(s): CKTOTAL, CKMB, CKMBINDEX, TROPONINI in the last 168 hours.  HbA1C: Hgb A1c MFr Bld  Date/Time Value Ref Range Status  03/09/2019 05:45 AM 6.2 (H) 4.8 - 5.6 % Final    Comment:    (NOTE)         Prediabetes: 5.7 - 6.4         Diabetes: >6.4         Glycemic control for adults with diabetes: <7.0     CBG: Recent Labs  Lab 04/13/19 0443 04/13/19 0557 04/13/19 0641 04/13/19 0806  GLUCAP 70 48* 85 73    Review of  Systems:   Unable to assess  Past Medical History  He,  has a past medical history of AAA (abdominal aortic aneurysm) ([place]), Constipation, Dermatitis, Diabetes ([place]), ESRD (end stage renal disease) on dialysis ([place]), Hyperlipidemia, Hypertension, and Tinea versicolor.   Surgical History    Past Surgical History:  Procedure Laterality Date  . AV FISTULA PLACEMENT  06/2005   Right Arm  . AV FISTULA PLACEMENT Right 09/04/2013   Procedure: REVISION OF RIGHT BRACHIOCEPHALIC FISTULA WITH INSERTION OF ARTERIOVENOUS (AV) GORE-TEX GRAFT ARM;  Surgeon: Mal Misty, MD;  Location: [place];  Service: Vascular;  Laterality: Right;  . AV FISTULA PLACEMENT Left 06/10/2015   Procedure: ARTERIOVENOUS (AV) FISTULA CREATION LEFT UPPER ARM;  Surgeon: Angelia Mould, MD;  Location: [place];  Service: Vascular;  Laterality: Left;  . COLONOSCOPY WITH PROPOFOL N/A 03/25/2018   Procedure: COLONOSCOPY WITH PROPOFOL;  Surgeon: Lollie Sails, MD;  Location: [facility] ENDOSCOPY;  Service: Endoscopy;  Laterality: N/A;  . PARATHYROIDECTOMY    . PARATHYROIDECTOMY Left 10/21/2012   Procedure: TOTAL PARATHYROIDECTOMY WITH AUTOTRANSPLANT TO THE LEFT FOREARM;  Surgeon: Earnstine Regal, MD;  Location: [place];  Service: General;  Laterality: Left;     Social History   reports that he has never smoked. He has never used smokeless tobacco. He reports that he does not drink alcohol or use drugs.   Family History   His family history is not on file. He was adopted.   Allergies Allergies  Allergen Reactions  . Aplisol [Tuberculin] Swelling     Home Medications  Prior to Admission medications   Medication Sig Start Date End Date Taking? Authorizing Provider  atorvastatin (LIPITOR) 20 MG tablet Take 20 mg by mouth daily.    [provider]  Calcium Acetate 667 MG TABS Take 1 tablet by mouth daily.    [provider]  calcium carbonate (TUMS) 500 MG chewable tablet Chew 1 tablet by mouth 3 (three) times  daily as needed for indigestion.     [provider]  fenofibrate 160 MG tablet Take 160 mg by mouth daily.    [provider]  glimepiride (AMARYL) 1 MG tablet Take 2 mg by mouth daily with breakfast.    [provider]  midodrine (PROAMATINE) 10 MG tablet Take 10 mg by mouth 3 (three) times daily.    [provider]     Critical care time: 60 min      .

## 2019-04-13 NOTE — Consult Note (Signed)
Renal Service Consult Note [facility]  Rana Pickron 04/13/2019 Sol Blazing Requesting Physician:  Dr Duwayne Heck  Reason for Consult:  ESRD pt w/ resp failure, COVID+ HPI: The patient is a 53 y.o. year-old w/ hx of HTN, HL, DM and ESRD on HD since 2008, was admitted here Sept 13- 18, 2020 for COVID-19 infection. He had mild disease, some basilar infiltrates but did not require ICU care. Rx's remdesivir and dexamethasone, markers were improving and pt dc'd home.  Per ED notes pt said he has tested negative for COVID since dc in September. Was due for HD today and came to ED due to SOB.  In ED CXR showing severe diffuse bilat infiltrates, tried NRB mask but then decompensated and required intubation this morning. Asked to see for ESRD.    Patient is intubated and unable to give any hx.    ROSn/a  Past Medical History  Past Medical History:  Diagnosis Date  . AAA (abdominal aortic aneurysm) ([place])   . Constipation   . Dermatitis   . Diabetes ([place])    boderline  . ESRD (end stage renal disease) on dialysis [facility])    started 09/2006 MWF [facility],   [facility]  . Hyperlipidemia   . Hypertension   . Tinea versicolor    Past Surgical History  Past Surgical History:  Procedure Laterality Date  . AV FISTULA PLACEMENT  06/2005   Right Arm  . AV FISTULA PLACEMENT Right 09/04/2013   Procedure: REVISION OF RIGHT BRACHIOCEPHALIC FISTULA WITH INSERTION OF ARTERIOVENOUS (AV) GORE-TEX GRAFT ARM;  Surgeon: Mal Misty, MD;  Location: [place];  Service: Vascular;  Laterality: Right;  . AV FISTULA PLACEMENT Left 06/10/2015   Procedure: ARTERIOVENOUS (AV) FISTULA CREATION LEFT UPPER ARM;  Surgeon: Angelia Mould, MD;  Location: [place];  Service: Vascular;  Laterality: Left;  . COLONOSCOPY WITH PROPOFOL N/A 03/25/2018   Procedure: COLONOSCOPY WITH PROPOFOL;  Surgeon: Lollie Sails, MD;  Location[facility] ENDOSCOPY;  Service: Endoscopy;  Laterality: N/A;  .  PARATHYROIDECTOMY    . PARATHYROIDECTOMY Left 10/21/2012   Procedure: TOTAL PARATHYROIDECTOMY WITH AUTOTRANSPLANT TO THE LEFT FOREARM;  Surgeon: Earnstine Regal, MD;  Location: [place];  Service: General;  Laterality: Left;   Family History  Family History  Adopted: Yes   Social History  reports that he has never smoked. He has never used smokeless tobacco. He reports that he does not drink alcohol or use drugs. Allergies  Allergies  Allergen Reactions  . Aplisol [Tuberculin] Swelling   Home medications Prior to Admission medications   Medication Sig Start Date End Date Taking? Authorizing Provider  atorvastatin (LIPITOR) 20 MG tablet Take 20 mg by mouth daily.    [provider]  Calcium Acetate 667 MG TABS Take 1 tablet by mouth daily.    [provider]  calcium carbonate (TUMS) 500 MG chewable tablet Chew 1 tablet by mouth 3 (three) times daily as needed for indigestion.     [provider]  fenofibrate 160 MG tablet Take 160 mg by mouth daily.    [provider]  glimepiride (AMARYL) 1 MG tablet Take 2 mg by mouth daily with breakfast.    [provider]  midodrine (PROAMATINE) 10 MG tablet Take 10 mg by mouth 3 (three) times daily.    [provider]   Liver Function Tests No results for input(s): AST, ALT, ALKPHOS, BILITOT, PROT, ALBUMIN in the last 168 hours. No results for input(s): LIPASE,  AMYLASE in the last 168 hours. CBC Recent Labs  Lab 04/13/19 0503 04/13/19 0535 04/13/19 0600 04/13/19 0847  WBC 15.5*  --   --   --   NEUTROABS 13.0*  --   --   --   HGB 9.7* 10.2* 9.5* 9.5*  HCT 30.9* 30.0* 28.0* 28.0*  MCV 84.4  --   --   --   PLT 340  --   --   --    Basic Metabolic Panel Recent Labs  Lab 04/13/19 0503 04/13/19 0535 04/13/19 0600 04/13/19 0847  NA 140 140 140 138  K 4.0 4.3 4.2 4.8  CL 99  --   --   --   CO2 24  --   --   --   GLUCOSE 75  --   --   --   BUN 53*  --   --   --   CREATININE 12.02*  --    --   --   CALCIUM 8.0*  --   --   --    Iron/TIBC/Ferritin/ %Sat    Component Value Date/Time   FERRITIN 4,804 (H) 03/13/2019 4944    Vitals:   04/13/19 1040 04/13/19 1045 04/13/19 1050 04/13/19 1117  BP: 94/67 90/66 93/62    Pulse: 89 90 88   Resp: (!) 24 (!) 24 (!) 24   Temp:      TempSrc:      SpO2: 100% 100% 99% 100%  Weight:      Height:        Exam Gen seen in ED, intubated and sedated on vent No rash, cyanosis or gangrene Sclera anicteric, throat w ETT  No jvd or bruits, flat neck veins Chest bilat ant crackles and post crackles RRR no MRG Abd soft ntnd no mass or ascites +bs, obses GU normal male MS no joint effusions or deformity Ext no sig LE or UE edema, no wounds or ulcers Neuro is on vent, sedated LUA AVF+bruit    Home meds:  - midodrine 10 tid  - glimepiride 2mg  qam  - fenofibrate 160 qd/ atorvastatin 20 qd  - calc acetate ac tid    Outpt HD: MWF AF  4h  99kg  2K/3.5Ca bath   L AVF   Heparin 10000  - mircera 150 ug every 2 wks, last ?     Assessment/ Plan: 1. Acute resp failure - COVID+ , now intubated. Has severe diffuse infiltrates on CXR. No gross vol excess on exam.  Suspect infiltrates due to infection. D/W CCM, will attempt acute regular HD to pull volume, if BP's will tolerate, to try and improve oxygenation.   2. ESRD - usual HD MWF 3. H/o COVID+ infection - in Sept 2020 4. DM2 - on oral agent 5. BP - is on midodrine at home, poss sequelae of COVID infection. OP BP's recently have been good around 150 /70.  6. HL 7. Anemia ckd - Hb 8-10 here, get record last esa      Kelly Splinter  MD 04/13/2019, 11:34 AM

## 2019-04-13 NOTE — ED Notes (Signed)
Intubation with color change. Equal breath sounds bilaterally.

## 2019-04-13 NOTE — ED Notes (Signed)
Dr. Leonides Schanz to stop Bi-pap. Pt placed on 6liters [place]

## 2019-04-13 NOTE — Progress Notes (Signed)
  Echocardiogram 2D Echocardiogram limited echo has been performed.  James Brown M 04/13/2019, 12:20 PM

## 2019-04-14 ENCOUNTER — Other Ambulatory Visit (HOSPITAL_COMMUNITY): Payer: Medicare Other

## 2019-04-14 ENCOUNTER — Inpatient Hospital Stay (HOSPITAL_COMMUNITY): Payer: Medicare Other

## 2019-04-14 DIAGNOSIS — J80 Acute respiratory distress syndrome: Secondary | ICD-10-CM

## 2019-04-14 DIAGNOSIS — J9601 Acute respiratory failure with hypoxia: Secondary | ICD-10-CM | POA: Diagnosis not present

## 2019-04-14 DIAGNOSIS — U071 COVID-19: Secondary | ICD-10-CM | POA: Diagnosis not present

## 2019-04-14 DIAGNOSIS — G9340 Encephalopathy, unspecified: Secondary | ICD-10-CM

## 2019-04-14 DIAGNOSIS — J1289 Other viral pneumonia: Secondary | ICD-10-CM | POA: Diagnosis not present

## 2019-04-14 LAB — CBC WITH DIFFERENTIAL/PLATELET
Abs Immature Granulocytes: 0.1 10*3/uL — ABNORMAL HIGH (ref 0.00–0.07)
Basophils Absolute: 0.1 10*3/uL (ref 0.0–0.1)
Basophils Relative: 0 %
Eosinophils Absolute: 0 10*3/uL (ref 0.0–0.5)
Eosinophils Relative: 0 %
HCT: 28.8 % — ABNORMAL LOW (ref 39.0–52.0)
Hemoglobin: 8.8 g/dL — ABNORMAL LOW (ref 13.0–17.0)
Immature Granulocytes: 1 %
Lymphocytes Relative: 12 %
Lymphs Abs: 1.6 10*3/uL (ref 0.7–4.0)
MCH: 26.1 pg (ref 26.0–34.0)
MCHC: 30.6 g/dL (ref 30.0–36.0)
MCV: 85.5 fL (ref 80.0–100.0)
Monocytes Absolute: 0.4 10*3/uL (ref 0.1–1.0)
Monocytes Relative: 3 %
Neutro Abs: 11.3 10*3/uL — ABNORMAL HIGH (ref 1.7–7.7)
Neutrophils Relative %: 84 %
Platelets: 336 10*3/uL (ref 150–400)
RBC: 3.37 MIL/uL — ABNORMAL LOW (ref 4.22–5.81)
RDW: 14.6 % (ref 11.5–15.5)
WBC: 13.4 10*3/uL — ABNORMAL HIGH (ref 4.0–10.5)
nRBC: 0 % (ref 0.0–0.2)

## 2019-04-14 LAB — GLUCOSE, CAPILLARY
Glucose-Capillary: 117 mg/dL — ABNORMAL HIGH (ref 70–99)
Glucose-Capillary: 203 mg/dL — ABNORMAL HIGH (ref 70–99)
Glucose-Capillary: 170 mg/dL — ABNORMAL HIGH (ref 70–99)
Glucose-Capillary: 190 mg/dL — ABNORMAL HIGH (ref 70–99)
Glucose-Capillary: 173 mg/dL — ABNORMAL HIGH (ref 70–99)
Glucose-Capillary: 189 mg/dL — ABNORMAL HIGH (ref 70–99)
Glucose-Capillary: 184 mg/dL — ABNORMAL HIGH (ref 70–99)

## 2019-04-14 LAB — BASIC METABOLIC PANEL
Anion gap: 16 — ABNORMAL HIGH (ref 5–15)
BUN: 36 mg/dL — ABNORMAL HIGH (ref 6–20)
CO2: 22 mmol/L (ref 22–32)
Calcium: 7.3 mg/dL — ABNORMAL LOW (ref 8.9–10.3)
Chloride: 98 mmol/L (ref 98–111)
Creatinine, Ser: 9.51 mg/dL — ABNORMAL HIGH (ref 0.61–1.24)
GFR calc Af Amer: 7 mL/min — ABNORMAL LOW (ref >60)
GFR calc non Af Amer: 6 mL/min — ABNORMAL LOW (ref >60)
Glucose, Bld: 182 mg/dL — ABNORMAL HIGH (ref 70–99)
Potassium: 5.3 mmol/L — ABNORMAL HIGH (ref 3.5–5.1)
Sodium: 136 mmol/L (ref 135–145)

## 2019-04-14 LAB — POCT I-STAT 7, (LYTES, BLD GAS, ICA,H+H)
Acid-base deficit: 1 mmol/L (ref 0.0–2.0)
Bicarbonate: 24.1 mmol/L (ref 20.0–28.0)
Calcium, Ion: 0.95 mmol/L — ABNORMAL LOW (ref 1.15–1.40)
HCT: 23 % — ABNORMAL LOW (ref 39.0–52.0)
Hemoglobin: 7.8 g/dL — ABNORMAL LOW (ref 13.0–17.0)
O2 Saturation: 98 %
Potassium: 5.1 mmol/L (ref 3.5–5.1)
Sodium: 136 mmol/L (ref 135–145)
TCO2: 25 mmol/L (ref 22–32)
pCO2 arterial: 39.1 mmHg (ref 32.0–48.0)
pH, Arterial: 7.398 (ref 7.350–7.450)
pO2, Arterial: 101 mmHg (ref 83.0–108.0)

## 2019-04-14 LAB — PROCALCITONIN: Procalcitonin: 3.55 ng/mL

## 2019-04-14 LAB — MAGNESIUM: Magnesium: 1.8 mg/dL (ref 1.7–2.4)

## 2019-04-14 LAB — VANCOMYCIN, RANDOM: Vancomycin Rm: 16

## 2019-04-14 MED ORDER — CHLORHEXIDINE GLUCONATE CLOTH 2 % EX PADS
6.0000 | MEDICATED_PAD | Freq: Every day | CUTANEOUS | Status: DC
  Administered 2019-04-15: 07:00:00 6 via TOPICAL

## 2019-04-14 MED ORDER — CHLORHEXIDINE GLUCONATE CLOTH 2 % EX PADS
6.0000 | MEDICATED_PAD | Freq: Every day | CUTANEOUS | Status: DC
  Administered 2019-04-15 – 2019-04-17 (×3): 6 via TOPICAL

## 2019-04-14 MED ORDER — B COMPLEX-C PO TABS
1.0000 | ORAL_TABLET | Freq: Every day | ORAL | Status: DC
  Administered 2019-04-14 – 2019-04-16 (×3): 1
  Filled 2019-04-14 (×3): qty 1

## 2019-04-14 MED ORDER — VITAL HIGH PROTEIN PO LIQD
1000.0000 mL | ORAL | Status: DC
  Administered 2019-04-14: 1000 mL

## 2019-04-14 MED ORDER — INSULIN ASPART 100 UNIT/ML ~~LOC~~ SOLN
1.0000 [IU] | SUBCUTANEOUS | Status: DC
  Administered 2019-04-14 – 2019-04-15 (×5): 2 [IU] via SUBCUTANEOUS
  Administered 2019-04-15: 09:00:00 1 [IU] via SUBCUTANEOUS
  Administered 2019-04-15 (×2): 2 [IU] via SUBCUTANEOUS
  Administered 2019-04-15 – 2019-04-16 (×2): 1 [IU] via SUBCUTANEOUS

## 2019-04-14 MED ORDER — PRO-STAT SUGAR FREE PO LIQD
30.0000 mL | Freq: Three times a day (TID) | ORAL | Status: DC
  Administered 2019-04-14 (×2): 30 mL
  Filled 2019-04-14 (×3): qty 30

## 2019-04-14 MED ORDER — SODIUM ZIRCONIUM CYCLOSILICATE 10 G PO PACK
10.0000 g | PACK | Freq: Three times a day (TID) | ORAL | Status: AC
  Administered 2019-04-14 (×2): 10 g via ORAL
  Filled 2019-04-14 (×2): qty 1

## 2019-04-14 NOTE — Progress Notes (Signed)
Pharmacy Antibiotic Note  James Brown is a 53 y.o. male admitted on 04/13/2019 with sepsis.  Pharmacy has been consulted for Cefepime and Vancomycin dosing. Also on doxy per MD. Antibiotic day #2. ESRD on HD MWF - remains on schedule, tolerated full 3-hour session 10/19.  Patient appears to have missed his additional vancomycin 1g IV loading dose yesterday prior to starting HD. However, vancomycin random level this AM is within goal at 16.  Plan: Continue vancomycin 1g IV qHD MWF Cefepime 1g IV q24h for now Doxy 100mg  IV q12h per MD Monitor clinical progress, c/s, abx plan/LOT Pre-HD vancomycin level as indicated F/u HD schedule/tolerance inpatient   Height: 5\' 6"  (167.6 cm) Weight: 219 lb 5.7 oz (99.5 kg) IBW/kg (Calculated) : 63.8  Temp (24hrs), Avg:98.8 F (37.1 C), Min:97.1 F (36.2 C), Max:100.8 F (38.2 C)  Recent Labs  Lab 04/13/19 0503 04/13/19 0519 04/13/19 0825 04/13/19 1125 04/14/19 0352 04/14/19 1005  WBC 15.5*  --   --   --  13.4*  --   CREATININE 12.02*  --   --  13.01* 9.51*  --   LATICACIDVEN  --  2.1* 1.8  --   --   --   VANCORANDOM  --   --   --   --   --  16    Estimated Creatinine Clearance: 10.2 mL/min (A) (by C-G formula based on SCr of 9.51 mg/dL (H)).    Allergies  Allergen Reactions  . Aplisol [Tuberculin] Swelling    Antimicrobials this admission: 10/19 Cefepime >>  10/19 Vancomycin >>  10/19 doxy >>  Microbiology results: 10/19 BCx: ngtd 10/19 COVID + (from 03/08/2019 originally)  Elicia Lamp, PharmD, BCPS Please check AMION for all [place] contact numbers Clinical Pharmacist 04/14/2019 11:31 AM

## 2019-04-14 NOTE — Progress Notes (Signed)
[facility] Progress Note  Subjective: got 3 L off w/ HD yesterday, on 40% FiO2 today, responding   Vitals:   04/14/19 0900 04/14/19 1000 04/14/19 1131 04/14/19 1200  BP: (!) 141/80 120/80  127/79  Pulse: 79 77  95  Resp: (!) 25 (!) 26  (!) 29  Temp:      TempSrc:      SpO2: 100% 99% 100% 98%  Weight:      Height:        Inpatient medications: . B-complex with vitamin C  1 tablet Per Tube Daily  . chlorhexidine gluconate (MEDLINE KIT)  15 mL Mouth Rinse BID  . Chlorhexidine Gluconate Cloth  6 each Topical Daily  . Chlorhexidine Gluconate Cloth  6 each Topical Q0600  . dexamethasone (DECADRON) injection  6 mg Intravenous Q24H  . etomidate  0.3 mg/kg Intravenous Once  . feeding supplement (PRO-STAT SUGAR FREE 64)  30 mL Per Tube TID  . fentaNYL (SUBLIMAZE) injection  50 mcg Intravenous Once  . heparin injection (subcutaneous)  10,000 Units Subcutaneous Q8H  . insulin aspart  1-3 Units Subcutaneous Q4H  . mouth rinse  15 mL Mouth Rinse 10 times per day  . pantoprazole (PROTONIX) IV  40 mg Intravenous Q24H  . rocuronium  1 mg/kg Intravenous Once  . succinylcholine  1.5 mg/kg Intravenous Once   . sodium chloride    . ceFEPime (MAXIPIME) IV Stopped (04/14/19 0650)  . dextrose Stopped (04/14/19 0956)  . doxycycline (VIBRAMYCIN) IV 100 mg (04/14/19 1016)  . feeding supplement (VITAL HIGH PROTEIN) 1,000 mL (04/14/19 1144)  . fentaNYL infusion INTRAVENOUS 75 mcg/hr (04/14/19 1200)  . midazolam Stopped (04/13/19 1646)  . phenylephrine (NEO-SYNEPHRINE) Adult infusion 25 mcg/min (04/14/19 1200)  . [START ON 04/15/2019] vancomycin     Place/Maintain arterial line **AND** sodium chloride, acetaminophen, fentaNYL, midazolam, midazolam, midazolam, ondansetron (ZOFRAN) IV, rocuronium    Exam:  Patient not examined directly given COVID-19 + status, utilizing exam of the primary team and observations of RN's.      Home meds:  - midodrine 10 tid  - glimepiride 19m  qam  - fenofibrate 160 qd/ atorvastatin 20 qd  - calc acetate ac tid    Outpt HD: MWF AF  4h  99kg  2K/3.5Ca bath   L AVF   Heparin 10000  - mircera 150 ug every 2 wks, last ?     Assessment/ Plan: 1. Acute resp failure - COVID+ , now intubated. Has severe diffuse infiltrates on CXR. No gross vol excess on exam. At dry wt but could be losing body wt. Pulm edema and/or infection/ARDS. Will plan HD tomorrow am and on Thursday, get vol down as much as will tolerate.  2. ESRD - usual HD MWF, will need transition to TTS due to COVID OP sched 3. H/o COVID+ infection - Sept 2020 4. DM2 - on oral agent at home, SSI here 5. BP - is on midodrine at home, poss sequelae of COVID infection. OP BP's recently have been good around 150 /70.  6. HL 7. Anemia ckd - Hb 8-10 here, get record last esa     RKelly Splinter10/20/2020, 12:37 PM  Iron/TIBC/Ferritin/ %Sat    Component Value Date/Time   FERRITIN 952 (H) 04/13/2019 1125   Recent Labs  Lab 04/13/19 1125  04/14/19 0352  NA 136   < > 136  K 5.2*   < > 5.3*  CL 101  --  98  CO2 23  --  22  GLUCOSE 109*  --  182*  BUN 55*  --  36*  CREATININE 13.01*  --  9.51*  CALCIUM 7.2*  --  7.3*  ALBUMIN 2.4*  --   --    < > = values in this interval not displayed.   Recent Labs  Lab 04/13/19 1125  AST 20  ALT 10  ALKPHOS 41  BILITOT 0.5  PROT 6.0*   Recent Labs  Lab 04/14/19 0352  WBC 13.4*  HGB 8.8*  HCT 28.8*  PLT 336

## 2019-04-14 NOTE — Progress Notes (Signed)
Patient coughing and appeared in distress. Thick tan secretions noted in ETT and HME. RR in 40's and peak pressures elevated. Suctioned moderate amount of thick tan secretions from ETT and changed HME. Patient required increased dose of Fentanyl during this time. Held tube feeds and placed OGT to intermittent suction. RT notified. Dr. Nelda Marseille notified and order received to hold TF.  Myrle Sheng, BSN, RN 27M/89M MICU

## 2019-04-14 NOTE — Progress Notes (Signed)
[facility] Progress Note Patient Name: James Brown DOB: [birth year] MRN: [id number]   Date of Service  04/14/2019  HPI/Events of Note  Request for AM labs.  eICU Interventions  Will order: 1. ABG, CBC with Platelets, BMP and Mg++ level in AM.     Intervention Category Major Interventions: Other:  Sommer,Steven Cornelia Copa 04/14/2019, 2:40 AM

## 2019-04-14 NOTE — Progress Notes (Addendum)
NAME:  James Brown, MRN:  [id number], DOB:  21-Oct-1965, LOS: 1 ADMISSION DATE:  04/13/2019, CONSULTATION DATE:  04/13/19 REFERRING MD:  [name], CHIEF COMPLAINT:  Shortness of breath    Brief History   53 year old man CKD on HD, Dm, HLD, here with hypoxemic resp failure.   History of present illness   SOB and cough x 2 days. Clear sputum production Admitted to ED, sat 76 % on RA.  96% on RA.  Hypoglycemic.    PER ED note:  Was admitted to the hospital on 03/08/2019 -03/13/2019 for respiratory failure secondary to COVID-19 infection.  Did not require intubation.  States he had been feeling better until the last few days.  He states he has had at least 3 negative tests for COVID 19 since this admission.  Denies any sick contacts or recent travel.  He has not missed any dialysis.  Was last dialyzed on Friday, October 16.  He is due for dialysis this morning.  He does not wear oxygen at home.  Sats were in the 70s on room air upon EMS arrival.  Placed on nonrebreather by EMS.  Currently on 5 L nasal cannula.  He denies being a smoker.  No history of COPD, asthma, CHF, PE, DVT.  No lower extremity swelling or pain. He states since discharge on 03/13/19 he has had three negative COVID tests at dialysis at [facility].  IN ED:  "We discussed that he would not be able to be admitted on bipap while COVID positive.  Immediately after coming off BiPAP patient desaturated into the 80s despite being on nasal cannula 6 L and had immediate increased work of breathing with respiratory rates in the 50s to 60s.  Patient was transitioned to nonrebreather and then intubated.  After intubation patient required significant sedation and paralysis to bring oxygen saturations into the 90s.  PEEP is 16 and FiO2 of 100%.  Will repeat ABG.  Patient did receive approximately 500 mL of IV fluids between antibiotics and dextrose infusion. 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 No meds and on  midodrine per chart)  Lipitor, fenofibrate, glimeprimide, midodrine TID  Significant Hospital Events      Consults:    Procedures:   10/19 Intubated  Significant Diagnostic Tests:  CXR: dense B infiltrates   Micro Data:  Bl cultures pending 10/19  Antimicrobials:  vanc 10/019>>> Cefepime 10/019>>> Azithro 10/019>>>  Interim history/subjective:  No events overnight Occasional vent dyssynchrony  Objective   Blood pressure (!) 145/85, pulse 81, temperature (!) 97.1 F (36.2 C), temperature source Axillary, resp. rate (!) 23, height 5\' 6"  (1.676 m), weight 99.5 kg, SpO2 100 %.    Vent Mode: PRVC FiO2 (%):  [40 %-70 %] 40 % Set Rate:  [24 bmp-32 bmp] 26 bmp Vt Set:  [380 mL-510 mL] 380 mL PEEP:  [8 cmH20-14 cmH20] 8 cmH20 Plateau Pressure:  [20 cmH20-30 cmH20] 23 cmH20   Intake/Output Summary (Last 24 hours) at 04/14/2019 0940 Last data filed at 04/14/2019 0800 Gross per 24 hour  Intake 2910.88 ml  Output 3000 ml  Net -89.12 ml   Filed Weights   04/13/19 1345 04/13/19 1729 04/14/19 0500  Weight: 102.3 kg 99.3 kg 99.5 kg    Examination: General: Alert and interactive this AM, follows some commands with gestures HENT: [name]/AT, PERRL, EOM-I and MMM, ETT in place Lungs: Diffuse crackles Cardiovascular: RRR, Nl S1/S2 and -M/R/G Abdomen:  Soft, NT, ND and +BS Extremities: -edema and -tenderness, AV fistula on RUE Neuro: Arousable and following some commands  I reviewed CXR myself, ETT is in a good position and infiltrate note  Resolved Hospital Problem list     Assessment & Plan:  53 year old male with ESRD-HD who was diagnosed with COVID-19 on 03/08/19 who developed ARDS and was reintubated but responding nicely to PEEP and volume negative.  Discussed with ID-MD.  Acute respiratory failure:  - ARDS protocol  - Will need volume negative prior to serious consideration of extubation  - Titrate O2 for sat of 88-92% not higher  - Maintain current vent settings  for now.  Hyperkalemia:  - Diureses per HD  Acute pulmonary edema:  - D/C IVF  - HD per renal, will request more volume negative  Hypotension:  - Neo for BP support  - Will re-evaluate after sedation is over  PCCM will continue to manage  On afternoon rounds, spoke with ID-MD, cycle threshold is 42 for the patient, any value above 34 is considered non-infectious.  RN notified and advised to d/c isolation as a policy but staff is to use their digression on PPE uses since patient did test positive and each team member will have a different threshold of comfort when it comes to exposure.  Best practice:  Diet: start TF tomorrow or when more stable Pain/Anxiety/Delirium protocol (if indicated):  fent versed  VAP protocol (if indicated): yes DVT prophylaxis: lovenox will dose based on  GI prophylaxis: ppi Glucose control: will to SSI when needed Mobility: bed rest Code Status: full Family Communication: Updated son [name], who will be the main contact.   Disposition: ICU  Labs   CBC: Recent Labs  Lab 04/13/19 0503  04/13/19 0600 04/13/19 0847 04/13/19 1137 04/14/19 0346 04/14/19 0352  WBC 15.5*  --   --   --   --   --  13.4*  NEUTROABS 13.0*  --   --   --   --   --  11.3*  HGB 9.7*   < > 9.5* 9.5* 8.5* 7.8* 8.8*  HCT 30.9*   < > 28.0* 28.0* 25.0* 23.0* 28.8*  MCV 84.4  --   --   --   --   --  85.5  PLT 340  --   --   --   --   --  336   < > = values in this interval not displayed.    Basic Metabolic Panel: Recent Labs  Lab 04/13/19 0503  04/13/19 0847 04/13/19 1125 04/13/19 1137 04/14/19 0346 04/14/19 0352  NA 140   < > 138 136 139 136 136  K 4.0   < > 4.8 5.2* 4.5 5.1 5.3*  CL 99  --   --  101  --   --  98  CO2 24  --   --  23  --   --  22  GLUCOSE 75  --   --  109*  --   --  182*  BUN 53*  --   --  55*  --   --  36*  CREATININE 12.02*  --   --  13.01*  --   --  9.51*  CALCIUM 8.0*  --   --  7.2*  --   --  7.3*  MG  --   --   --   --   --   --  1.8   < > =  values  in this interval not displayed.   GFR: Estimated Creatinine Clearance: 10.2 mL/min (A) (by C-G formula based on SCr of 9.51 mg/dL (H)). Recent Labs  Lab 04/13/19 0503 04/13/19 0519 04/13/19 0825 04/13/19 1125 04/14/19 0352  PROCALCITON  --   --  0.76 1.20 3.55  WBC 15.5*  --   --   --  13.4*  LATICACIDVEN  --  2.1* 1.8  --   --     Liver Function Tests: Recent Labs  Lab 04/13/19 1125  AST 20  ALT 10  ALKPHOS 41  BILITOT 0.5  PROT 6.0*  ALBUMIN 2.4*   No results for input(s): LIPASE, AMYLASE in the last 168 hours. No results for input(s): AMMONIA in the last 168 hours.  ABG    Component Value Date/Time   PHART 7.398 04/14/2019 0346   PCO2ART 39.1 04/14/2019 0346   PO2ART 101.0 04/14/2019 0346   HCO3 24.1 04/14/2019 0346   TCO2 25 04/14/2019 0346   ACIDBASEDEF 1.0 04/14/2019 0346   O2SAT 98.0 04/14/2019 0346     Coagulation Profile: No results for input(s): INR, PROTIME in the last 168 hours.  Cardiac Enzymes: No results for input(s): CKTOTAL, CKMB, CKMBINDEX, TROPONINI in the last 168 hours.  HbA1C: Hgb A1c MFr Bld  Date/Time Value Ref Range Status  03/09/2019 05:45 AM 6.2 (H) 4.8 - 5.6 % Final    Comment:    (NOTE)         Prediabetes: 5.7 - 6.4         Diabetes: >6.4         Glycemic control for adults with diabetes: <7.0     CBG: Recent Labs  Lab 04/13/19 1845 04/13/19 2044 04/13/19 2325 04/14/19 0438 04/14/19 0800  GLUCAP 127* 87 117* 203* 170*   The patient is critically ill with multiple organ systems failure and requires high complexity decision making for assessment and support, frequent evaluation and titration of therapies, application of advanced monitoring technologies and extensive interpretation of multiple databases.   Critical Care Time devoted to patient care services described in this note is  40  Minutes. This time reflects time of care of this signee Dr Jennet Maduro. This critical care time does not reflect procedure  time, or teaching time or supervisory time of PA/NP/Med student/Med Resident etc but could involve care discussion time.  Rush Farmer, M.D. [facility] Pulmonary/Critical Care Medicine. Pager: [phone]. After hours pager: [phone].

## 2019-04-14 NOTE — Progress Notes (Signed)
Initial Nutrition Assessment  RD working remotely.  DOCUMENTATION CODES:   Obesity unspecified  INTERVENTION:   Initiate TF via OGT with Vital High Protein at goal rate of 45 ml/h (1080 ml per day)  30 ml Prostat TID  -B complex with vitamin C daily  Regimen to provide 1380 kcals, 140 gm protein, 903 ml free water daily.   NUTRITION DIAGNOSIS:   Inadequate oral intake related to inability to eat as evidenced by NPO status.  GOAL:   Patient will meet greater than or equal to 90% of their needs  MONITOR:   Vent status, Labs, Weight trends, TF tolerance, Skin, I & O's  REASON FOR ASSESSMENT:   Consult, Ventilator Enteral/tube feeding initiation and management  ASSESSMENT:   53 year old man CKD on HD, Dm, HLD, here with hypoxemic resp failure.  Pt admitted with acute respiratory failure with COVID-19.   10/19- intubated  Patient is currently intubated on ventilator support. OGT clamped; positioning verified by x-ray.  MV: 12.3 L/min Temp (24hrs), Avg:98.8 F (37.1 C), Min:97.1 F (36.2 C), Max:100.8 F (38.2 C)  MAP: 102  Reviewed I/O's: -97 ml x 24 hours and +403 ml since admission  UOP: 0 ml x 24 hours  Per nephrology notes, plan for acute HD to pull off volume.   Wt has been stable over the past year (3% wt loss x 1 year).   Medications reviewed and include dextrose 10% infusion @ 25 ml/hr and neosynephirine.   Lab Results  Component Value Date   HGBA1C 6.2 (H) 03/09/2019   PTA DM medications are 2 mg glimepiride q AM.   Labs reviewed: K: 5.3, Mg WDL, CBGS: 203 (inpatient orders for glycemic control are 1-3 units insulin aspart every 4 hours).   Diet Order:   Diet Order            Diet NPO time specified  Diet effective now              EDUCATION NEEDS:   No education needs have been identified at this time  Skin:  Skin Assessment: Reviewed RN Assessment  Last BM:  Unknown  Height:   Ht Readings from Last 1 Encounters:   04/13/19 5\' 6"  (1.676 m)    Weight:   Wt Readings from Last 1 Encounters:  04/14/19 99.5 kg    Ideal Body Weight:  64.5 kg  BMI:  Body mass index is 35.41 kg/m.  Estimated Nutritional Needs:   Kcal:  3403-7096  Protein:  > 129 grams  Fluid:  per MD    Ciera Beckum A. Jimmye Norman, RD, LDN, [place] Registered Dietitian II Certified Diabetes Care and Education Specialist Pager: [phone] After hours Pager: [phone]

## 2019-04-14 NOTE — Progress Notes (Signed)
eLink Physician-Brief Progress Note Patient Name: James Brown DOB: 04/28/1966 MRN: [id number]   Date of Service  04/14/2019  HPI/Events of Note  Hyperglycemia - Blood glucose = 203. Currently on D10W at 50 mL hour.   eICU Interventions  Will order: 1. Decrease D10W IV infusion to 25 mL/hour.      Intervention Category Major Interventions: Hyperglycemia - active titration of insulin therapy  Sommer,Steven Cornelia Copa 04/14/2019, 5:49 AM

## 2019-04-14 NOTE — Progress Notes (Signed)
Infectious Disease Progress Note  Mr James Brown is a 53yo M who was hospitalized for covid-19 disease in mid September (03/08/19/0, received remdesivir- recovered but now readmitted for sepsis, respiratory failure requiring intubation. He had repeat covid-19 + on 10/19 which CT value of 42.  Based on literature and what is known/recommended for patients who have recovered for C-19, they can still have persistently positive PCR, though not viable/infectious up to 2 months from original infection.   ID Recommendations - based on the CT value of his PCR from 10/19   - patient has recovered from covid 19 and his present illness is likely a separate disease process, not the worsening of covid-19 nor reinfection. - recommend to discontinue airborne/contact isolation - do not recommend to repeat COVID-19 testing unless need to for SNF placement  If questions, feel free to call Infection prevention on call or myself  Caren Griffins B. Anon Raices for Infectious Diseases [phone]

## 2019-04-14 NOTE — Progress Notes (Signed)
[facility] Progress Note Patient Name: James Brown DOB: [birth year] MRN: [id number]   Date of Service  04/14/2019  HPI/Events of Note  Pt needs restraints order renewed.  eICU Interventions  Restraints renewed.        [name] 04/14/2019, 11:51 PM

## 2019-04-14 NOTE — Procedures (Signed)
   I was present at this dialysis session, have reviewed the session itself and made  appropriate changes Kelly Splinter MD [place] pager [phone]   04/14/2019, 1:00 PM

## 2019-04-15 ENCOUNTER — Inpatient Hospital Stay (HOSPITAL_COMMUNITY): Payer: Medicare Other

## 2019-04-15 DIAGNOSIS — U071 COVID-19: Secondary | ICD-10-CM | POA: Diagnosis not present

## 2019-04-15 DIAGNOSIS — J810 Acute pulmonary edema: Secondary | ICD-10-CM | POA: Diagnosis not present

## 2019-04-15 DIAGNOSIS — J1289 Other viral pneumonia: Secondary | ICD-10-CM | POA: Diagnosis not present

## 2019-04-15 DIAGNOSIS — J9601 Acute respiratory failure with hypoxia: Secondary | ICD-10-CM | POA: Diagnosis not present

## 2019-04-15 LAB — POCT I-STAT 7, (LYTES, BLD GAS, ICA,H+H)
Acid-base deficit: 3 mmol/L — ABNORMAL HIGH (ref 0.0–2.0)
Bicarbonate: 21.7 mmol/L (ref 20.0–28.0)
Calcium, Ion: 0.9 mmol/L — ABNORMAL LOW (ref 1.15–1.40)
HCT: 25 % — ABNORMAL LOW (ref 39.0–52.0)
Hemoglobin: 8.5 g/dL — ABNORMAL LOW (ref 13.0–17.0)
O2 Saturation: 98 %
Patient temperature: 98
Potassium: 4.6 mmol/L (ref 3.5–5.1)
Sodium: 134 mmol/L — ABNORMAL LOW (ref 135–145)
TCO2: 23 mmol/L (ref 22–32)
pCO2 arterial: 37.4 mmHg (ref 32.0–48.0)
pH, Arterial: 7.37 (ref 7.350–7.450)
pO2, Arterial: 104 mmHg (ref 83.0–108.0)

## 2019-04-15 LAB — CBC
HCT: 25.9 % — ABNORMAL LOW (ref 39.0–52.0)
Hemoglobin: 8.3 g/dL — ABNORMAL LOW (ref 13.0–17.0)
MCH: 26.7 pg (ref 26.0–34.0)
MCHC: 32 g/dL (ref 30.0–36.0)
MCV: 83.3 fL (ref 80.0–100.0)
Platelets: 284 10*3/uL (ref 150–400)
RBC: 3.11 MIL/uL — ABNORMAL LOW (ref 4.22–5.81)
RDW: 14.4 % (ref 11.5–15.5)
WBC: 15.8 10*3/uL — ABNORMAL HIGH (ref 4.0–10.5)
nRBC: 0 % (ref 0.0–0.2)

## 2019-04-15 LAB — BASIC METABOLIC PANEL
Anion gap: 17 — ABNORMAL HIGH (ref 5–15)
BUN: 69 mg/dL — ABNORMAL HIGH (ref 6–20)
CO2: 19 mmol/L — ABNORMAL LOW (ref 22–32)
Calcium: 6.8 mg/dL — ABNORMAL LOW (ref 8.9–10.3)
Chloride: 98 mmol/L (ref 98–111)
Creatinine, Ser: 11.72 mg/dL — ABNORMAL HIGH (ref 0.61–1.24)
GFR calc Af Amer: 5 mL/min — ABNORMAL LOW (ref >60)
GFR calc non Af Amer: 4 mL/min — ABNORMAL LOW (ref >60)
Glucose, Bld: 159 mg/dL — ABNORMAL HIGH (ref 70–99)
Potassium: 4.6 mmol/L (ref 3.5–5.1)
Sodium: 134 mmol/L — ABNORMAL LOW (ref 135–145)

## 2019-04-15 LAB — PHOSPHORUS: Phosphorus: 6.1 mg/dL — ABNORMAL HIGH (ref 2.5–4.6)

## 2019-04-15 LAB — GLUCOSE, CAPILLARY
Glucose-Capillary: 118 mg/dL — ABNORMAL HIGH (ref 70–99)
Glucose-Capillary: 120 mg/dL — ABNORMAL HIGH (ref 70–99)
Glucose-Capillary: 141 mg/dL — ABNORMAL HIGH (ref 70–99)
Glucose-Capillary: 149 mg/dL — ABNORMAL HIGH (ref 70–99)
Glucose-Capillary: 154 mg/dL — ABNORMAL HIGH (ref 70–99)
Glucose-Capillary: 155 mg/dL — ABNORMAL HIGH (ref 70–99)
Glucose-Capillary: 177 mg/dL — ABNORMAL HIGH (ref 70–99)

## 2019-04-15 LAB — MAGNESIUM: Magnesium: 2.1 mg/dL (ref 1.7–2.4)

## 2019-04-15 LAB — PROCALCITONIN: Procalcitonin: 3.65 ng/mL

## 2019-04-15 MED ORDER — HEPARIN SODIUM (PORCINE) 1000 UNIT/ML IJ SOLN
INTRAMUSCULAR | Status: AC
  Filled 2019-04-15: qty 8

## 2019-04-15 MED ORDER — CALCIUM GLUCONATE-NACL 2-0.675 GM/100ML-% IV SOLN
2.0000 g | Freq: Once | INTRAVENOUS | Status: AC
  Administered 2019-04-15: 10:00:00 2000 mg via INTRAVENOUS
  Filled 2019-04-15: qty 100

## 2019-04-15 MED ORDER — VANCOMYCIN HCL IN DEXTROSE 1-5 GM/200ML-% IV SOLN
INTRAVENOUS | Status: AC
  Administered 2019-04-15: 1000 mg
  Filled 2019-04-15: qty 200

## 2019-04-15 NOTE — Progress Notes (Signed)
[facility] Progress Note  Subjective: no new issues, on 40% FiO2, getting ready for dialysis  Vitals:   04/15/19 0700 04/15/19 0800 04/15/19 0900 04/15/19 0905  BP: 134/75 (!) 101/55 132/75 116/87  Pulse: 94 71 94 92  Resp: (!) 26 (!) _0 Temp:  97.7 F (36.5 C)    TempSrc:  Oral    SpO2: 100% 97% 100% 100%  Weight:      Height:        Inpatient medications: . B-complex with vitamin C  1 tablet Per Tube Daily  . chlorhexidine gluconate (MEDLINE KIT)  15 mL Mouth Rinse BID  . Chlorhexidine Gluconate Cloth  6 each Topical Q0600  . dexamethasone (DECADRON) injection  6 mg Intravenous Q24H  . etomidate  0.3 mg/kg Intravenous Once  . feeding supplement (PRO-STAT SUGAR FREE 64)  30 mL Per Tube TID  . fentaNYL (SUBLIMAZE) injection  50 mcg Intravenous Once  . heparin      . heparin injection (subcutaneous)  10,000 Units Subcutaneous Q8H  . insulin aspart  1-3 Units Subcutaneous Q4H  . mouth rinse  15 mL Mouth Rinse 10 times per day  . pantoprazole (PROTONIX) IV  40 mg Intravenous Q24H  . rocuronium  1 mg/kg Intravenous Once  . succinylcholine  1.5 mg/kg Intravenous Once   . sodium chloride    . ceFEPime (MAXIPIME) IV 1 g (04/15/19 0647)  . dextrose Stopped (04/14/19 0956)  . doxycycline (VIBRAMYCIN) IV Stopped (04/15/19 0022)  . fentaNYL infusion INTRAVENOUS 200 mcg/hr (04/15/19 0600)  . midazolam Stopped (04/13/19 1646)  . phenylephrine (NEO-SYNEPHRINE) Adult infusion 25 mcg/min (04/14/19 1200)  . vancomycin    . vancomycin     Place/Maintain arterial line **AND** sodium chloride, acetaminophen, fentaNYL, midazolam, midazolam, midazolam, ondansetron (ZOFRAN) IV, rocuronium    Exam:  Patient not examined directly given COVID-19 + status, utilizing exam of the primary team and observations of RN's.      Home meds:  - midodrine 10 tid  - glimepiride 12m qam  - fenofibrate 160 qd/ atorvastatin 20 qd  - calc acetate ac tid    Outpt HD: MWF  AF  4h  99kg  2K/3.5Ca bath   L AVF   Heparin 10000  - mircera 150 ug every 2 wks, last ?     Assessment/ Plan: 1. Acute resp failure - COVID+ , now intubated. Has severe diffuse infiltrates on CXR, pna and/or fluid overload. 3L off on Monday night.  40% FiO2. Max UF w/ HD today, and tomorrow if needed, try to improve oxygenation.  2. ESRD - usual HD MWF, will need transition to TTS due to COVID OP sched 3. H/o COVID+ infection - Sept 2020 4. DM2 - on oral agent at home, SSI here 5. BP - is on midodrine at home, poss sequelae of COVID infection. Using pressors here for BP assist.   6. HL 7. Anemia ckd - Hb 8-10 here, get record last esa     RKelly Splinter10/21/2020, 9:20 AM  Iron/TIBC/Ferritin/ %Sat    Component Value Date/Time   FERRITIN 952 (H) 04/13/2019 1125   Recent Labs  Lab 04/13/19 1125  04/15/19 0518  NA 136   < > 134*  K 5.2*   < > 4.6  CL 101   < > 98  CO2 23   < > 19*  GLUCOSE 109*   < > 159*  BUN 55*   < > 69*  CREATININE 13.01*   < >  11.72*  CALCIUM 7.2*   < > 6.8*  PHOS  --   --  6.1*  ALBUMIN 2.4*  --   --    < > = values in this interval not displayed.   Recent Labs  Lab 04/13/19 1125  AST 20  ALT 10  ALKPHOS 41  BILITOT 0.5  PROT 6.0*   Recent Labs  Lab 04/15/19 0518  WBC 15.8*  HGB 8.3*  HCT 25.9*  PLT 284

## 2019-04-15 NOTE — Progress Notes (Addendum)
NAME:  James Brown, MRN:  [id number], DOB:  January 26, 1966, LOS: 2 ADMISSION DATE:  04/13/2019, CONSULTATION DATE:  04/13/19 REFERRING MD:  [name], CHIEF COMPLAINT:  Shortness of breath    Brief History   53 year old man CKD on HD, Dm, HLD, here with hypoxemic resp failure.   History of present illness   SOB and cough x 2 days. Clear sputum production Admitted to ED, sat 76 % on RA.  96% on RA.  Hypoglycemic.    PER ED note:  Was admitted to the hospital on 03/08/2019 -03/13/2019 for respiratory failure secondary to COVID-19 infection.  Did not require intubation.  States he had been feeling better until the last few days.  He states he has had at least 3 negative tests for COVID 19 since this admission.  Denies any sick contacts or recent travel.  He has not missed any dialysis.  Was last dialyzed on Friday, October 16.  He is due for dialysis this morning.  He does not wear oxygen at home.  Sats were in the 70s on room air upon EMS arrival.  Placed on nonrebreather by EMS.  Currently on 5 L nasal cannula.  He denies being a smoker.  No history of COPD, asthma, CHF, PE, DVT.  No lower extremity swelling or pain. He states since discharge on 03/13/19 he has had three negative COVID tests at dialysis at [facility].  IN ED:  "We discussed that he would not be able to be admitted on bipap while COVID positive.  Immediately after coming off BiPAP patient desaturated into the 80s despite being on nasal cannula 6 L and had immediate increased work of breathing with respiratory rates in the 50s to 60s.  Patient was transitioned to nonrebreather and then intubated.  After intubation patient required significant sedation and paralysis to bring oxygen saturations into the 90s.  PEEP is 16 and FiO2 of 100%.  Will repeat ABG.  Patient did receive approximately 500 mL of IV fluids between antibiotics and dextrose infusion. 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 No meds and on  midodrine per chart)  Lipitor, fenofibrate, glimeprimide, midodrine TID  Significant Hospital Events      Consults:  Nephrology   Procedures:  10/19 Intubated  Significant Diagnostic Tests:  CXR 10/19: dense B/L infiltrates CXR 10/20: improved but persistent B/L infiltrates    Micro Data:  10/19 Blood>>NGTD  10/20 Tracheal aspirate>> abundant GNR, few GPR, rare GPC  Antimicrobials:  Vanc 10/18>> Cefepime 10/18>> Doxycycline 10/019>> Remdesivir 10/19 x 1 dose (was previously treated)   Interim history/subjective:  No events overnight. On PSV 5/5. Off pressors. On HD with goal 3L removal today. Pt denies complaints.   Objective   Blood pressure 123/71, pulse 92, temperature 98.4 F (36.9 C), temperature source Axillary, resp. rate 11, height 5\' 6"  (1.676 m), weight 104.3 kg, SpO2 100 %.    Vent Mode: PSV;CPAP FiO2 (%):  [40 %] 40 % Set Rate:  [26 bmp] 26 bmp Vt Set:  [380 mL] 380 mL PEEP:  [5 cmH20] 5 cmH20 Pressure Support:  [5 cmH20] 5 cmH20 Plateau Pressure:  [14 cmH20-20 cmH20] 14 cmH20   Intake/Output Summary (Last 24 hours) at 04/15/2019 0951 Last data filed at 04/15/2019 0900 Gross per 24 hour  Intake 1230.55 ml  Output -  Net 1230.55 ml   Filed Weights   04/14/19 0500 04/15/19 0500 04/15/19 0912  Weight: 99.5 kg  99 kg 104.3 kg    Examination: General: Well-developed, well nourished. Alert, NAD. Communicates with nods and hand gestures  HENT: Normocephalic, PERRL. Moist mucus membranes Neck: No JVD. Trachea midline.  CV: RRR. S1S2. No MRG. +2 distal pulses. LUE AV Fistula connected to HD  Lungs: BBS present, coarse rhonchi throughout, strong cough on command, FNL, symmetrical ABD: +BS x4. SNT/ND. No masses, guarding or rigidity GU: No Foley EXT: MAE well. 2+ edema to hands, trace to b/l LE  Skin: PWD. In tact. No rashes or lesions Neuro: A&O. No focal deficits. Follows commands x4.    Resolved Hospital Problem list     Assessment & Plan:   53 year old male with ESRD-HD who was diagnosed with COVID-19 on 03/08/19 who developed ARDS and was reintubated but responded to PEEP and volume negative.    Acute respiratory failure: ID Recommendations -  Based on literature and what is known/recommended for patients who have recovered for C-19, they can still have persistently positive PCR, though not viable/infectious up to 2 months from original infection. Based on the CT value of his PCR from 10/19  - patient has recovered from [place] 19 and his present illness is likely a separate disease process, not the worsening of covid-19 nor reinfection. - recommend to discontinue airborne/contact isolation - do not recommend to repeat COVID-19 testing unless need to for SNF placement ARDS protocol Will need volume negative prior to  consideration of extubation-he remains 1.8L positive, on HD today  Titrate O2 for sat of 88-92% not higher Continue ventilator support to prevent eminent deterioration and further organ dysfunction from hypoxemia and hypercarbia.   Patient is at risk for sudden hypoxia, barotrauma and hemodynamic compromise.   Head of bed elevated 30 degrees. Plateau pressures less than 30 cm H20.  Follow chest x-ray, ABG.   SAT/SBT as tolerated. Pulling good VT on PSV this am  Bronchial hygiene. RT/bronchodilator protocol. D/C Vanc. Continue cefepime and doxy (atypical coverage)  D/C steroids   ESRD on HD usually MWF with plan to transition to TTS due to COVID OP schedule Hyperkalemia:resolved -HD per neph, currently on same with plan for tomorrow if needed for volume removal  -continue to follow K+ with BMP               Acute pulmonary edema: - HD per renal. HD again today   Hypotension: resolved. Off pressors.  - monitor     Best practice:  Diet: TF per recs  Pain/Anxiety/Delirium protocol (if indicated):prn   VAP protocol (if indicated): yes DVT prophylaxis: heparin GI prophylaxis: PPI Glucose control:  SSI  Mobility: bed rest Code Status: FULL CODE  Family Communication: Will update  Disposition: ICU  Labs   CBC: Recent Labs  Lab 04/13/19 0503  04/13/19 1137 04/14/19 0346 04/14/19 0352 04/15/19 0506 04/15/19 0518  WBC 15.5*  --   --   --  13.4*  --  15.8*  NEUTROABS 13.0*  --   --   --  11.3*  --   --   HGB 9.7*   < > 8.5* 7.8* 8.8* 8.5* 8.3*  HCT 30.9*   < > 25.0* 23.0* 28.8* 25.0* 25.9*  MCV 84.4  --   --   --  85.5  --  83.3  PLT 340  --   --   --  336  --  284   < > = values in this interval not displayed.    Basic Metabolic Panel: Recent Labs  Lab 04/13/19 0503  04/13/19 1125 04/13/19 1137 04/14/19 0346 04/14/19 0352 04/15/19 0506 04/15/19 0518  NA 140   < > 136 139 136 136 134* 134*  K 4.0   < > 5.2* 4.5 5.1 5.3* 4.6 4.6  CL 99  --  101  --   --  98  --  98  CO2 24  --  23  --   --  22  --  19*  GLUCOSE 75  --  109*  --   --  182*  --  159*  BUN 53*  --  55*  --   --  36*  --  69*  CREATININE 12.02*  --  13.01*  --   --  9.51*  --  11.72*  CALCIUM 8.0*  --  7.2*  --   --  7.3*  --  6.8*  MG  --   --   --   --   --  1.8  --  2.1  PHOS  --   --   --   --   --   --   --  6.1*   < > = values in this interval not displayed.   GFR: Estimated Creatinine Clearance: 8.4 mL/min (A) (by C-G formula based on SCr of 11.72 mg/dL (H)). Recent Labs  Lab 04/13/19 0503 04/13/19 0519 04/13/19 0825 04/13/19 1125 04/14/19 0352 04/15/19 0518  PROCALCITON  --   --  0.76 1.20 3.55 3.65  WBC 15.5*  --   --   --  13.4* 15.8*  LATICACIDVEN  --  2.1* 1.8  --   --   --     Liver Function Tests: Recent Labs  Lab 04/13/19 1125  AST 20  ALT 10  ALKPHOS 41  BILITOT 0.5  PROT 6.0*  ALBUMIN 2.4*    ABG    Component Value Date/Time   PHART 7.370 04/15/2019 0506   PCO2ART 37.4 04/15/2019 0506   PO2ART 104.0 04/15/2019 0506   HCO3 21.7 04/15/2019 0506   TCO2 23 04/15/2019 0506   ACIDBASEDEF 3.0 (H) 04/15/2019 0506   O2SAT 98.0 04/15/2019 0506     CBG: Recent  Labs  Lab 04/14/19 1646 04/14/19 2233 04/15/19 0027 04/15/19 0512 04/15/19 0810  GLUCAP 189* 184* 154* 149* 141*     The patient is critically ill with respiratory failure. He requires ongoing ICU for high complexity decision making, titration of high alert medications, ventilator management, titration of oxygen and interpretation of advanced monitoring.    I personally spent 45 minutes providing critical care services including personally reviewing test results, discussing care with nursing staff/other physicians and completing orders pertaining to this patient.  Time was exclusive to the patient and does not include time spent teaching or in procedures.  Voice recognition software was used in the production of this record.  Errors in interpretation may have been inadvertently missed during review.  Francine Graven, MSN, AGACNP  Pager [phone] or if no answer [phone] [place] [facility]  Attending Note:  53 year old with remote COVID-19 that remains positive but none infectious per ID who was intubated for likely pulmonary edema and respiratory failure.  Patient with fluid removal has weaned down to 40% and PEEP of 10 with coarse BS on exam.  I reviewed CXR myself, ETT is in a good position and infiltrate noted.  Discussed with nephrology-MD.  Will remove 3 more liters via HD today.  Once fluid is removed will consider extubation.  In the meantime, drop PEEP to 5 and begin SBT.  Will re-evaluate in the afternoon post HD for potential extubation.  The patient is critically ill with multiple organ systems failure and requires high complexity decision making for assessment and support, frequent evaluation and titration of therapies, application of advanced monitoring technologies and extensive interpretation of multiple databases.   Critical Care Time devoted to patient care services described in this note is  33  Minutes. This time reflects time of care of this signee Dr  Jennet Maduro. This critical care time does not reflect procedure time, or teaching time or supervisory time of PA/NP/Med student/Med Resident etc but could involve care discussion time.  Rush Farmer, M.D. [facility] Pulmonary/Critical Care Medicine. Pager: [phone]. After hours pager: [phone].

## 2019-04-15 NOTE — Procedures (Signed)
Extubation Procedure Note  Patient Details:   Name: James Brown DOB: 12-23-1965 MRN: [id number]   Airway Documentation:    Vent end date: 04/15/19 Vent end time: 1601   Evaluation  O2 sats: stable throughout Complications: No apparent complications Patient did tolerate procedure well. Bilateral Breath Sounds: Diminished, Rhonchi   Yes   Pt extubated per CCM order to [place] 4 lpm. Cuff leak heard prior to procedure. RT and RN at bedside. Pt able to speak and answer questions appropriately. Strong cough noted.  Esperanza Sheets T 04/15/2019, 4:05 PM

## 2019-04-15 NOTE — Progress Notes (Signed)
Pt goal is not met d/t pt HR increased to 130 and pt restless. Pt HD tx  terminated 30 mins early. Dr. Schertz notified. 

## 2019-04-15 NOTE — Procedures (Signed)
   I was present at this dialysis session, have reviewed the session itself and made  appropriate changes Kelly Splinter MD [place] pager [phone]   04/15/2019, 9:24 AM

## 2019-04-15 NOTE — Progress Notes (Signed)
Tolerating weaning post dialysis, will extubate, full code.  Rush Farmer, M.D. [facility] Pulmonary/Critical Care Medicine. Pager: [phone]. After hours pager: [phone].

## 2019-04-16 ENCOUNTER — Inpatient Hospital Stay (HOSPITAL_COMMUNITY): Payer: Medicare Other

## 2019-04-16 DIAGNOSIS — U071 COVID-19: Secondary | ICD-10-CM | POA: Diagnosis not present

## 2019-04-16 DIAGNOSIS — N186 End stage renal disease: Secondary | ICD-10-CM

## 2019-04-16 DIAGNOSIS — R5381 Other malaise: Secondary | ICD-10-CM | POA: Diagnosis not present

## 2019-04-16 DIAGNOSIS — J9601 Acute respiratory failure with hypoxia: Secondary | ICD-10-CM | POA: Diagnosis not present

## 2019-04-16 LAB — CBC
HCT: 24.2 % — ABNORMAL LOW (ref 39.0–52.0)
Hemoglobin: 7.6 g/dL — ABNORMAL LOW (ref 13.0–17.0)
MCH: 25.9 pg — ABNORMAL LOW (ref 26.0–34.0)
MCHC: 31.4 g/dL (ref 30.0–36.0)
MCV: 82.6 fL (ref 80.0–100.0)
Platelets: 200 10*3/uL (ref 150–400)
RBC: 2.93 MIL/uL — ABNORMAL LOW (ref 4.22–5.81)
RDW: 14.2 % (ref 11.5–15.5)
WBC: 11 10*3/uL — ABNORMAL HIGH (ref 4.0–10.5)
nRBC: 0 % (ref 0.0–0.2)

## 2019-04-16 LAB — RENAL FUNCTION PANEL
Albumin: 2.3 g/dL — ABNORMAL LOW (ref 3.5–5.0)
Anion gap: 16 — ABNORMAL HIGH (ref 5–15)
BUN: 58 mg/dL — ABNORMAL HIGH (ref 6–20)
CO2: 22 mmol/L (ref 22–32)
Calcium: 6.9 mg/dL — ABNORMAL LOW (ref 8.9–10.3)
Chloride: 98 mmol/L (ref 98–111)
Creatinine, Ser: 9.55 mg/dL — ABNORMAL HIGH (ref 0.61–1.24)
GFR calc Af Amer: 7 mL/min — ABNORMAL LOW (ref >60)
GFR calc non Af Amer: 6 mL/min — ABNORMAL LOW (ref >60)
Glucose, Bld: 122 mg/dL — ABNORMAL HIGH (ref 70–99)
Phosphorus: 6.5 mg/dL — ABNORMAL HIGH (ref 2.5–4.6)
Potassium: 4.2 mmol/L (ref 3.5–5.1)
Sodium: 136 mmol/L (ref 135–145)

## 2019-04-16 LAB — GLUCOSE, CAPILLARY
Glucose-Capillary: 112 mg/dL — ABNORMAL HIGH (ref 70–99)
Glucose-Capillary: 131 mg/dL — ABNORMAL HIGH (ref 70–99)
Glucose-Capillary: 194 mg/dL — ABNORMAL HIGH (ref 70–99)
Glucose-Capillary: 127 mg/dL — ABNORMAL HIGH (ref 70–99)
Glucose-Capillary: 126 mg/dL — ABNORMAL HIGH (ref 70–99)

## 2019-04-16 MED ORDER — NEPRO/CARBSTEADY PO LIQD
237.0000 mL | Freq: Two times a day (BID) | ORAL | Status: DC
  Administered 2019-04-16 – 2019-04-17 (×3): 237 mL via ORAL

## 2019-04-16 MED ORDER — SODIUM CHLORIDE 0.9 % IV SOLN
2.0000 g | INTRAVENOUS | Status: DC
  Administered 2019-04-16 – 2019-04-17 (×2): 2 g via INTRAVENOUS
  Filled 2019-04-16 (×2): qty 20

## 2019-04-16 MED ORDER — CALCIUM ACETATE (PHOS BINDER) 667 MG PO CAPS
657.0000 mg | ORAL_CAPSULE | Freq: Three times a day (TID) | ORAL | Status: DC
  Administered 2019-04-16 – 2019-04-18 (×6): 667 mg via ORAL
  Filled 2019-04-16 (×6): qty 1

## 2019-04-16 MED ORDER — FENOFIBRATE 160 MG PO TABS
160.0000 mg | ORAL_TABLET | Freq: Every day | ORAL | Status: DC
  Administered 2019-04-16 – 2019-04-18 (×3): 160 mg via ORAL
  Filled 2019-04-16 (×3): qty 1

## 2019-04-16 MED ORDER — INSULIN ASPART 100 UNIT/ML ~~LOC~~ SOLN
0.0000 [IU] | Freq: Three times a day (TID) | SUBCUTANEOUS | Status: DC
  Administered 2019-04-16: 2 [IU] via SUBCUTANEOUS
  Administered 2019-04-16 – 2019-04-18 (×3): 1 [IU] via SUBCUTANEOUS

## 2019-04-16 MED ORDER — CHLORHEXIDINE GLUCONATE CLOTH 2 % EX PADS
6.0000 | MEDICATED_PAD | Freq: Every day | CUTANEOUS | Status: DC
  Administered 2019-04-17: 6 via TOPICAL

## 2019-04-16 MED ORDER — RENA-VITE PO TABS
1.0000 | ORAL_TABLET | Freq: Every day | ORAL | Status: DC
  Administered 2019-04-16 – 2019-04-17 (×2): 1 via ORAL
  Filled 2019-04-16 (×2): qty 1

## 2019-04-16 MED ORDER — DARBEPOETIN ALFA 100 MCG/0.5ML IJ SOSY
100.0000 ug | PREFILLED_SYRINGE | INTRAMUSCULAR | Status: DC
  Administered 2019-04-17: 16:00:00 100 ug via INTRAVENOUS
  Filled 2019-04-16: qty 0.5

## 2019-04-16 MED ORDER — PRO-STAT SUGAR FREE PO LIQD
30.0000 mL | Freq: Two times a day (BID) | ORAL | Status: DC
  Administered 2019-04-16 – 2019-04-17 (×3): 30 mL via ORAL
  Filled 2019-04-16 (×3): qty 30

## 2019-04-16 MED ORDER — ATORVASTATIN CALCIUM 10 MG PO TABS
20.0000 mg | ORAL_TABLET | Freq: Every day | ORAL | Status: DC
  Administered 2019-04-16 – 2019-04-18 (×3): 20 mg via ORAL
  Filled 2019-04-16 (×3): qty 2

## 2019-04-16 MED ORDER — B COMPLEX-C PO TABS: 1.0000 | ORAL_TABLET | Freq: Every day | ORAL | Status: DC

## 2019-04-16 MED ORDER — MIDODRINE HCL 5 MG PO TABS
10.0000 mg | ORAL_TABLET | Freq: Two times a day (BID) | ORAL | Status: DC
  Administered 2019-04-16 – 2019-04-18 (×5): 10 mg via ORAL
  Filled 2019-04-16 (×4): qty 2

## 2019-04-16 NOTE — Evaluation (Signed)
Physical Therapy Evaluation Patient Details Name: James Brown MRN: [id number] DOB: 01-17-1966 Today's Date: 04/16/2019   History of Present Illness  53 year old man CKD on HD, Dm, HLD, came to ED 04/13/19 with hypoxemic resp failure after 2 days of SoB and cough. Pt previously admitted to the hospital on 03/08/2019 -03/13/2019 for respiratory failure secondary to COVID-19 infection. In ED pt required intubation. Extubated 10/21.   Clinical Impression  PTA living with wife and adult children in single story home with 1 step to enter. Pt was ambulating limited community distances without DME and was independent in ADLs and iADLs, however had not returned to his pre-COVID level of activity due to increased DoE. Pt is currently limited in safe mobility by generalized weakness and decreased endurance. Pt requires supervision for bed mobility and transfers and min guard for ambulation of 300 feet. Pt able to maintain SaO2 on RA >90%O2 with ambulation, however experiences 3/4 DoE and feels fatigued after ambulation. No recommendation for PT at discharge, however PT will continue to follow acutely to improve endurance to be able to achieve PLOF.    Follow Up Recommendations No PT follow up;Supervision - Intermittent    Equipment Recommendations  None recommended by PT    Recommendations for Other Services       Precautions / Restrictions Precautions Precautions: None Restrictions Weight Bearing Restrictions: No      Mobility  Bed Mobility Overal bed mobility: Needs Assistance Bed Mobility: Supine to Sit     Supine to sit: Supervision     General bed mobility comments: supervision for safety, increased time and effort   Transfers Overall transfer level: Needs assistance   Transfers: Sit to/from Stand Sit to Stand: Supervision         General transfer comment: supervision for safety, increased time and effort able to self steady   Ambulation/Gait Ambulation/Gait  assistance: Min guard Gait Distance (Feet): 300 Feet Assistive device: None Gait Pattern/deviations: Step-through pattern;Decreased step length - right;Decreased step length - left;Wide base of support Gait velocity: slowed Gait velocity interpretation: 1.31 - 2.62 ft/sec, indicative of limited community ambulator General Gait Details: slow, mildly unsteady gait with no overt LoB          Balance Overall balance assessment: Mild deficits observed, not formally tested                                           Pertinent Vitals/Pain Pain Assessment: No/denies pain    Home Living Family/patient expects to be discharged to:: Private residence Living Arrangements: Spouse/significant other;Children(25/24 yr old children) Available Help at Discharge: Family Type of Home: House Home Access: Stairs to enter   Technical brewer of Steps: 1 Home Layout: One level Home Equipment: Grab bars - tub/shower;Walker - 2 wheels      Prior Function Level of Independence: Independent         Comments: reports no DME usage        Extremity/Trunk Assessment   Upper Extremity Assessment Upper Extremity Assessment: Defer to OT evaluation    Lower Extremity Assessment Lower Extremity Assessment: Overall WFL for tasks assessed       Communication   Communication: No difficulties  Cognition Arousal/Alertness: Awake/alert Behavior During Therapy: WFL for tasks assessed/performed Overall Cognitive Status: Within Functional Limits for tasks assessed  General Comments General comments (skin integrity, edema, etc.): at rest SaO2 on RA 96%O2 HR 84bpm, with ambulation SaO2 dropped to 90%O2 with HR 112bpm, pt provided with incentive spirometer and reeducated on usage, pt able to draw 1250 mL, encouraged use hourly to improve lung function        Assessment/Plan    PT Assessment Patient needs continued PT  services  PT Problem List Decreased activity tolerance;Decreased mobility;Cardiopulmonary status limiting activity       PT Treatment Interventions Gait training;Stair training;Functional mobility training;Therapeutic activities;Therapeutic exercise;Balance training;Patient/family education    PT Goals (Current goals can be found in the Care Plan section)  Acute Rehab PT Goals Patient Stated Goal: be able to play golf again PT Goal Formulation: With patient Time For Goal Achievement: 04/30/19 Potential to Achieve Goals: Good    Frequency Min 3X/week    AM-PAC PT "6 Clicks" Mobility  Outcome Measure Help needed turning from your back to your side while in a flat bed without using bedrails?: None Help needed moving from lying on your back to sitting on the side of a flat bed without using bedrails?: None Help needed moving to and from a bed to a chair (including a wheelchair)?: None Help needed standing up from a chair using your arms (e.g., wheelchair or bedside chair)?: None Help needed to walk in hospital room?: A Little Help needed climbing 3-5 steps with a railing? : A Little 6 Click Score: 22    End of Session Equipment Utilized During Treatment: Gait belt Activity Tolerance: Patient tolerated treatment well Patient left: in chair;with call bell/phone within reach Nurse Communication: Mobility status;Other (comment)(maintains SaO2 >90% on RA) PT Visit Diagnosis: Unsteadiness on feet (R26.81);Difficulty in walking, not elsewhere classified (R26.2)    Time: 3785-8850 PT Time Calculation (min) (ACUTE ONLY): 23 min   Charges:   PT Evaluation $PT Eval Moderate Complexity: 1 Mod          Rogue Rafalski B. Migdalia Dk PT, DPT Acute Rehabilitation Services Pager [phone] Office [phone]   [place] 04/16/2019, 4:21 PM

## 2019-04-16 NOTE — Evaluation (Signed)
Occupational Therapy Evaluation Patient Details Name: James Brown MRN: [id number] DOB: January 19, 1966 Today's Date: 04/16/2019    History of Present Illness 53 year old man CKD on HD, Dm, HLD, came to ED 04/13/19 with hypoxemic resp failure after 2 days of SoB and cough. Pt previously admitted to the hospital on 03/08/2019 -03/13/2019 for respiratory failure secondary to COVID-19 infection. In ED pt required intubation. Extubated 10/21.    Clinical Impression   Pt admitted with above diagnoses, presenting with decreased activity tolerance and BADL endurance. PTA pt with recent stay at [place] but had been home and independent. At time of eval he is supervision for transfers and is able to complete BADLs at mod I level but presents with decreased endurance. O2 sats 90 after functional mobility in the hall with increased RR. Educated on pursed lip breathing and use of IS with return demonstration. Pt able to pull about 1000 on IS. No OT follow up indicated, will continue to follow acutely.    Follow Up Recommendations  No OT follow up    Equipment Recommendations  None recommended by OT    Recommendations for Other Services       Precautions / Restrictions Precautions Precautions: None Restrictions Weight Bearing Restrictions: No      Mobility Bed Mobility Overal bed mobility: Needs Assistance Bed Mobility: Supine to Sit     Supine to sit: Supervision     General bed mobility comments: supervision for safety, increased time and effort   Transfers Overall transfer level: Needs assistance   Transfers: Sit to/from Stand Sit to Stand: Supervision         General transfer comment: supervision for safety, increased time and effort able to self steady     Balance Overall balance assessment: Mild deficits observed, not formally tested                                         ADL either performed or assessed with clinical judgement   ADL  Overall ADL's : Modified independent;At baseline                                       General ADL Comments: pt physically able to complete BADL at mod I level. Needing increased time and BADL endurance training and support     Vision Patient Visual Report: No change from baseline Vision Assessment?: No apparent visual deficits     Perception     Praxis      Pertinent Vitals/Pain Pain Assessment: No/denies pain     Hand Dominance     Extremity/Trunk Assessment Upper Extremity Assessment Upper Extremity Assessment: Overall WFL for tasks assessed   Lower Extremity Assessment Lower Extremity Assessment: Defer to PT evaluation       Communication Communication Communication: No difficulties   Cognition Arousal/Alertness: Awake/alert Behavior During Therapy: WFL for tasks assessed/performed Overall Cognitive Status: Within Functional Limits for tasks assessed                                     General Comments  at rest SaO2 on RA 96%O2 HR 84bpm, with ambulation SaO2 dropped to 90%O2 with HR 112bpm, pt provided with incentive spirometer and reeducated on usage, pt able  to draw 1250 mL, encouraged use hourly to improve lung function    Exercises     Shoulder Instructions      Home Living Family/patient expects to be discharged to:: Private residence Living Arrangements: Spouse/significant other;Children(24/25 yr old children) Available Help at Discharge: Family Type of Home: House Home Access: Stairs to enter Technical brewer of Steps: 1   Home Layout: One level     Bathroom Shower/Tub: Teacher, early years/pre: Standard Bathroom Accessibility: Yes How Accessible: Accessible via wheelchair;Accessible via walker Home Equipment: Grab bars - tub/shower;Walker - 2 wheels          Prior Functioning/Environment Level of Independence: Independent        Comments: reports no DME usage        OT Problem List:  Decreased knowledge of use of DME or AE;Decreased activity tolerance;Cardiopulmonary status limiting activity      OT Treatment/Interventions: Self-care/ADL training;Therapeutic exercise;Patient/family education;Balance training;Energy conservation;DME and/or AE instruction    OT Goals(Current goals can be found in the care plan section) Acute Rehab OT Goals Patient Stated Goal: be able to play golf again OT Goal Formulation: With patient Time For Goal Achievement: 04/30/19 Potential to Achieve Goals: Good  OT Frequency: Min 2X/week   Barriers to D/C:            Co-evaluation PT/OT/SLP Co-Evaluation/Treatment: Yes Reason for Co-Treatment: To address functional/ADL transfers PT goals addressed during session: Mobility/safety with mobility OT goals addressed during session: ADL's and self-care      AM-PAC OT "6 Clicks" Daily Activity     Outcome Measure Help from another person eating meals?: A Little Help from another person taking care of personal grooming?: A Little Help from another person toileting, which includes using toliet, bedpan, or urinal?: None Help from another person bathing (including washing, rinsing, drying)?: A Little Help from another person to put on and taking off regular upper body clothing?: None Help from another person to put on and taking off regular lower body clothing?: None 6 Click Score: 21   End of Session Equipment Utilized During Treatment: Gait belt Nurse Communication: Mobility status  Activity Tolerance: Patient tolerated treatment well Patient left: in chair;with call bell/phone within reach;with chair alarm set  OT Visit Diagnosis: Other abnormalities of gait and mobility (R26.89)                Time: 5176-1607 OT Time Calculation (min): 24 min Charges:  OT Evaluation $OT Eval Low Complexity: 1 Low  Zenovia Jarred, MSOT, OTR/L [place] OT/ Acute Relief OT [facility] Office: [place] 04/16/2019, 5:11 PM

## 2019-04-16 NOTE — Progress Notes (Addendum)
NAME:  James Brown, MRN:  [id number], DOB:  10-13-1965, LOS: 3 ADMISSION DATE:  04/13/2019, CONSULTATION DATE:  04/13/19 REFERRING MD:  [name], CHIEF COMPLAINT:  Shortness of breath    Brief History   53 year old man CKD on HD, Dm, HLD, here with hypoxemic resp failure.   History of present illness   SOB and cough x 2 days. Clear sputum production Admitted to ED, sat 76 % on RA.  96% on RA.  Hypoglycemic.    PER ED note:  Was admitted to the hospital on 03/08/2019 -03/13/2019 for respiratory failure secondary to COVID-19 infection.  Did not require intubation.  States he had been feeling better until the last few days.  He states he has had at least 3 negative tests for COVID 19 since this admission.  Denies any sick contacts or recent travel.  He has not missed any dialysis.  Was last dialyzed on Friday, October 16.  He is due for dialysis this morning.  He does not wear oxygen at home.  Sats were in the 70s on room air upon EMS arrival.  Placed on nonrebreather by EMS.  Currently on 5 L nasal cannula.  He denies being a smoker.  No history of COPD, asthma, CHF, PE, DVT.  No lower extremity swelling or pain. He states since discharge on 03/13/19 he has had three negative COVID tests at dialysis at [facility].  IN ED:  "We discussed that he would not be able to be admitted on bipap while COVID positive.  Immediately after coming off BiPAP patient desaturated into the 80s despite being on nasal cannula 6 L and had immediate increased work of breathing with respiratory rates in the 50s to 60s.  Patient was transitioned to nonrebreather and then intubated.  After intubation patient required significant sedation and paralysis to bring oxygen saturations into the 90s.  PEEP is 16 and FiO2 of 100%.  Will repeat ABG.  Patient did receive approximately 500 mL of IV fluids between antibiotics and dextrose infusion. 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 No meds and on  midodrine per chart)  Lipitor, fenofibrate, glimeprimide, midodrine TID  Significant Hospital Events      Consults:  Nephrology   Procedures:  10/19 Intubated>>10/21  Significant Diagnostic Tests:  CXR 10/19: dense B/L infiltrates CXR 10/20: improved but persistent B/L infiltrates  CXR 10/22: improving but continued B/L infiltrates    Micro Data:  10/19 Blood>>NGTD  10/20 Tracheal aspirate>> abundant GNR, few GPR, rare GPC. NGTD  Antimicrobials:  Vanc 10/18>>10/21 Cefepime 10/18>>10/22 Doxycycline 10/19>>10/22 Remdesivir 10/19 x 1 dose (was previously treated)  Ceftiaxone 10/22>>10/24  Interim history/subjective:  Extubated yesterday, on room air, taking PO, feels much better. VSS.Marland Kitchen  Objective   Blood pressure 128/65, pulse 99, temperature 98.2 F (36.8 C), temperature source Oral, resp. rate 16, height 5\' 6"  (1.676 m), weight 102.3 kg, SpO2 95 %.     Intake/Output Summary (Last 24 hours) at 04/16/2019 0929 Last data filed at 04/16/2019 0650 Gross per 24 hour  Intake 595.3 ml  Output 2500 ml  Net -1904.7 ml   Filed Weights   04/15/19 0912 04/15/19 1230 04/16/19 0500  Weight: 104.3 kg 102.4 kg 102.3 kg    Examination: General: Well-developed, well nourished. Alert, NAD. Strong clear voice.  HENT: Normocephalic, PERRL. Moist mucus membranes Neck: No JVD. Trachea midline.  CV: RRR. S1S2. No MRG. +2 distal pulses. LUE AV Fistula +bruit/thrill  Lungs: BBS present, clear throughout, strong cough on command, FNL, symmetrical ABD: +BS x4. SNT/ND. No masses, guarding or rigidity GU: No Foley EXT: MAE well. 1+ edema to hands, no edema to b/l LE  Skin: PWD. In tact. No rashes or lesions Neuro: A&Ox4. No focal deficits    Resolved Hospital Problem list   Hypotension Acute hypoxic respiratory failure   Assessment & Plan:  53 year old male with ESRD-HD who was diagnosed with COVID-19 on 03/08/19 who developed ARDS and was reintubated but responded to PEEP and  volume negative.  ID Recommendations Re COVID19 -  "Based on literature and what is known/recommended for patients who have recovered for C-19, they can still have persistently positive PCR, though not viable/infectious up to 2 months from original infection. Based on the CT value of his PCR from 10/19  - patient has recovered from [place] 19 and his present illness is likely a separate disease process, not the worsening of covid-19 nor reinfection. - recommend to discontinue airborne/contact isolation - do not recommend to repeat COVID-19 testing unless need to for SNF placement"  Acute hypoxic respiratory failure respiratory failure:resolved Supplemetal O2 for sat of 88-92%, not requiring same  Bronchial hygiene. RT/bronchodilator protocol. D/C cefepime and doxy. De-escalate to ceftriaxone for 3 days aspiration coverage  OOB/PT/OT   ESRD on HD usually MWF with plan to transition to TTS due to COVID OP schedule Hyperkalemia:resolved -HD per neph -continue to follow K+ with BMP               Acute pulmonary edema: improving. I/O Net negative  - HD per renal     Best practice:  Diet: renal diet  Pain/Anxiety/Delirium protocol (if indicated):NA  VAP protocol (if indicated): NA DVT prophylaxis: heparin GI prophylaxis: D/C  Glucose control: SSI achs Mobility: PT/OT Code Status: FULL CODE  Family Communication: Will update  Disposition: pt may transfer out of ICU. He does not require isolation per ID.   Transfer to [facility] 04/17/19. PCCM will sign off. Thank you for the opportunity to participate in this patient's care. Please contact if we can be of further assistance.   Labs   CBC: Recent Labs  Lab 04/13/19 0503  04/14/19 0346 04/14/19 0352 04/15/19 0506 04/15/19 0518 04/16/19 0503  WBC 15.5*  --   --  13.4*  --  15.8* 11.0*  NEUTROABS 13.0*  --   --  11.3*  --   --   --   HGB 9.7*   < > 7.8* 8.8* 8.5* 8.3* 7.6*  HCT 30.9*   < > 23.0* 28.8* 25.0* 25.9* 24.2*  MCV  84.4  --   --  85.5  --  83.3 82.6  PLT 340  --   --  336  --  284 200   < > = values in this interval not displayed.    Basic Metabolic Panel: Recent Labs  Lab 04/13/19 0503  04/13/19 1125  04/14/19 0346 04/14/19 0352 04/15/19 0506 04/15/19 0518 04/16/19 0503  NA 140   < > 136   < > 136 136 134* 134* 136  K 4.0   < > 5.2*   < > 5.1 5.3* 4.6 4.6 4.2  CL 99  --  101  --   --  98  --  98 98  CO2 24  --  23  --   --  22  --  19* 22  GLUCOSE 75  --  109*  --   --  182*  --  159* 122*  BUN 53*  --  55*  --   --  36*  --  69* 58*  CREATININE 12.02*  --  13.01*  --   --  9.51*  --  11.72* 9.55*  CALCIUM 8.0*  --  7.2*  --   --  7.3*  --  6.8* 6.9*  MG  --   --   --   --   --  1.8  --  2.1  --   PHOS  --   --   --   --   --   --   --  6.1* 6.5*   < > = values in this interval not displayed.   GFR: Estimated Creatinine Clearance: 10.3 mL/min (A) (by C-G formula based on SCr of 9.55 mg/dL (H)). Recent Labs  Lab 04/13/19 0503 04/13/19 0519 04/13/19 0825 04/13/19 1125 04/14/19 0352 04/15/19 0518 04/16/19 0503  PROCALCITON  --   --  0.76 1.20 3.55 3.65  --   WBC 15.5*  --   --   --  13.4* 15.8* 11.0*  LATICACIDVEN  --  2.1* 1.8  --   --   --   --     Liver Function Tests: Recent Labs  Lab 04/13/19 1125 04/16/19 0503  AST 20  --   ALT 10  --   ALKPHOS 41  --   BILITOT 0.5  --   PROT 6.0*  --   ALBUMIN 2.4* 2.3*   CBG: Recent Labs  Lab 04/15/19 1557 04/15/19 1937 04/15/19 2335 04/16/19 0420 04/16/19 0800  GLUCAP [place]     Francine Graven, MSN, AGACNP  Pager [phone] or if no answer [phone] [facility]  Attending Note:  53 year old with ESRD-HD who presents to PCCM with respiratory failure after a recent diagnosis of COVID infection.  He is no longer infectious at this point.  Tolerated extubation overnight.  On exam, coarse BS but on RA and doing well.  I reviewed CXR myself, infiltrate noted.  Discussed with  PCCM-NP.  Acute respiratory failure:  - Monitor for airway protection and hypoxemia  Pulmonary infiltrate:  - Continue volume negative per renal  - CXR in AM  Physical deconditioning:  - PT  - OT  - OOB to chair  ESRD:  - HD today  - BMET in AM  - Renal following  Transfer out of the ICU and to [facility] with PCCM off 10/23  Patient seen and examined, agree with above note.  I dictated the care and orders written for this patient under my direction.  Rush Farmer, Guadalupe Guerra

## 2019-04-16 NOTE — Progress Notes (Addendum)
Nutrition Follow-up  DOCUMENTATION CODES:   Obesity unspecified  INTERVENTION:   -D/c B complex with C -Renal MVI daily -Nepro Shake po BID, each supplement provides 425 kcal and 19 grams protein -30 ml Prostat BID, each supplement provides 100 kcals and 15 grams protein  NUTRITION DIAGNOSIS:   Increased nutrient needs related to chronic illness(ESRD on HD) as evidenced by NPO status.  Ongoing  GOAL:   Patient will meet greater than or equal to 90% of their needs  Progressing   MONITOR:   PO intake, Supplement acceptance, Labs, Weight trends, Skin, I & O's  REASON FOR ASSESSMENT:   Consult, Ventilator Enteral/tube feeding initiation and management  ASSESSMENT:   53 year old man CKD on HD, Dm, HLD, here with hypoxemic resp failure.  10/20- TF held secondary to thick secretions 10/21- extubated  Reviewed I/O's: -1.8 L x 24 hours and -56 ml since admission  Last HD treatment 04/15/19.   Per PCCM notes, present illness likely a separate disease process, as he has recovered from COVID-19. Airborne/contact precautions d/c today. No plan to repeat COVID testing unless needed for SNF placement.   Per chart review, pt reports feeling better. He has been advanced to a renal, carb modified diet. Documented meal completion 60% at breakfast this morning.   Pt with no documented BM this admission.   Labs reviewed: Phos: 6.5 (on phoslo), CBGS: 112-177 (inpatient orders for glycemic control are 0-9 units insulin aspart TID with meals).   Diet Order:   Diet Order            Diet renal/carb modified with fluid restriction Diet-HS Snack? Nothing; Fluid restriction: Other (see comments); Room service appropriate? Yes; Fluid consistency: Thin  Diet effective now              EDUCATION NEEDS:   No education needs have been identified at this time  Skin:  Skin Assessment: Reviewed RN Assessment  Last BM:  Unknown  Height:   Ht Readings from Last 1 Encounters:   04/13/19 5\' 6"  (1.676 m)    Weight:   Wt Readings from Last 1 Encounters:  04/16/19 102.3 kg    Ideal Body Weight:  64.5 kg  BMI:  Body mass index is 36.4 kg/m.  Estimated Nutritional Needs:   Kcal:  2050-2250  Protein:  110-125 grams  Fluid:  1000 ml + UOP    James Brown A. Jimmye Norman, RD, LDN, [place] Registered Dietitian II Certified Diabetes Care and Education Specialist Pager: [phone] After hours Pager: [phone]

## 2019-04-16 NOTE — Progress Notes (Signed)
[facility] Progress Note  Subjective: extubated yest, on RA w/ 96%sat today. No c/o per patiient. Taken off of airborne restrictions.   Vitals:   04/16/19 1000 04/16/19 1056 04/16/19 1100 04/16/19 1154  BP: 130/79     Pulse: 96 (!) 104    Resp: 18 19 (!) 22   Temp:    98.2 F (36.8 C)  TempSrc:    Oral  SpO2: 97% 96%    Weight:      Height:        Inpatient medications: . atorvastatin  20 mg Oral Daily  . calcium acetate  667 mg Oral TID WC  . chlorhexidine gluconate (MEDLINE KIT)  15 mL Mouth Rinse BID  . Chlorhexidine Gluconate Cloth  6 each Topical Q0600  . feeding supplement (NEPRO CARB STEADY)  237 mL Oral BID BM  . feeding supplement (PRO-STAT SUGAR FREE 64)  30 mL Oral BID WC  . fenofibrate  160 mg Oral Daily  . heparin injection (subcutaneous)  10,000 Units Subcutaneous Q8H  . insulin aspart  0-9 Units Subcutaneous TID WC  . midodrine  10 mg Oral BID  . multivitamin  1 tablet Oral QHS   . cefTRIAXone (ROCEPHIN)  IV 2 g (04/16/19 1012)   acetaminophen, ondansetron (ZOFRAN) IV    Exam:  seen in ICU, alert and in no idstress on RA  no jvd  Chest bibasilar crackles R > L  Cor reg no RG  Abd soft obese ntnd  Ext no pitting edema   L arm AVF +b/t  NF, Ox 3     Home meds:  - midodrine 10 tid  - glimepiride 60m qam  - fenofibrate 160 qd/ atorvastatin 20 qd  - calc acetate ac tid    Outpt HD: MWF AF  4h  99kg  2K/3.5Ca bath   L AVF   Heparin 10000  - mircera 150 ug every 2 wks, last ?     Assessment/ Plan: 1. Acute resp failure - COVID+ in Sept 2020, was neg then per pt, now + again at admission.  Bilat severe diffuse infiltrates on admit have improved significantly w/ HD/ vol removal, some infiltrates remain but was extubated yesterady and comfortable today on RA 2. BP/ volume -  Doubt wt's are accurate. Max UF and stand wt with HD tomorrow.  3. ESRD - usual HD MWF. HD Friday.  4. H/o COVID+ infection - Sept 2020. Airborne  restrictions lifted, per ID does not need isolation.  5. DM2 - on oral agent at home, SSI here 6. BP - is on midodrine at home and here 7. HL 8. Anemia ckd - Hb down 7.6. Give darbe 100 w/ HD tomorrow.      Rob Benjiman Sedgwick 04/16/2019, 4:08 PM  Iron/TIBC/Ferritin/ %Sat    Component Value Date/Time   FERRITIN 952 (H) 04/13/2019 1125   Recent Labs  Lab 04/16/19 0503  NA 136  K 4.2  CL 98  CO2 22  GLUCOSE 122*  BUN 58*  CREATININE 9.55*  CALCIUM 6.9*  PHOS 6.5*  ALBUMIN 2.3*   Recent Labs  Lab 04/13/19 1125  AST 20  ALT 10  ALKPHOS 41  BILITOT 0.5  PROT 6.0*   Recent Labs  Lab 04/16/19 0503  WBC 11.0*  HGB 7.6*  HCT 24.2*  PLT 200

## 2019-04-17 DIAGNOSIS — D638 Anemia in other chronic diseases classified elsewhere: Secondary | ICD-10-CM

## 2019-04-17 DIAGNOSIS — J810 Acute pulmonary edema: Secondary | ICD-10-CM | POA: Diagnosis not present

## 2019-04-17 DIAGNOSIS — J9601 Acute respiratory failure with hypoxia: Secondary | ICD-10-CM | POA: Diagnosis not present

## 2019-04-17 LAB — CULTURE, RESPIRATORY W GRAM STAIN: Culture: NORMAL

## 2019-04-17 LAB — RENAL FUNCTION PANEL
Albumin: 2.4 g/dL — ABNORMAL LOW (ref 3.5–5.0)
Anion gap: 15 (ref 5–15)
BUN: 85 mg/dL — ABNORMAL HIGH (ref 6–20)
CO2: 21 mmol/L — ABNORMAL LOW (ref 22–32)
Calcium: 6.6 mg/dL — ABNORMAL LOW (ref 8.9–10.3)
Chloride: 100 mmol/L (ref 98–111)
Creatinine, Ser: 11.49 mg/dL — ABNORMAL HIGH (ref 0.61–1.24)
GFR calc Af Amer: 5 mL/min — ABNORMAL LOW (ref >60)
GFR calc non Af Amer: 5 mL/min — ABNORMAL LOW (ref >60)
Glucose, Bld: 83 mg/dL (ref 70–99)
Phosphorus: 5.8 mg/dL — ABNORMAL HIGH (ref 2.5–4.6)
Potassium: 3.7 mmol/L (ref 3.5–5.1)
Sodium: 136 mmol/L (ref 135–145)

## 2019-04-17 LAB — CBC
HCT: 25.2 % — ABNORMAL LOW (ref 39.0–52.0)
Hemoglobin: 8.1 g/dL — ABNORMAL LOW (ref 13.0–17.0)
MCH: 26.2 pg (ref 26.0–34.0)
MCHC: 32.1 g/dL (ref 30.0–36.0)
MCV: 81.6 fL (ref 80.0–100.0)
Platelets: 316 10*3/uL (ref 150–400)
RBC: 3.09 MIL/uL — ABNORMAL LOW (ref 4.22–5.81)
RDW: 14.2 % (ref 11.5–15.5)
WBC: 9.7 10*3/uL (ref 4.0–10.5)
nRBC: 0 % (ref 0.0–0.2)

## 2019-04-17 LAB — GLUCOSE, CAPILLARY
Glucose-Capillary: 78 mg/dL (ref 70–99)
Glucose-Capillary: 139 mg/dL — ABNORMAL HIGH (ref 70–99)
Glucose-Capillary: 95 mg/dL (ref 70–99)
Glucose-Capillary: 138 mg/dL — ABNORMAL HIGH (ref 70–99)

## 2019-04-17 LAB — MRSA PCR SCREENING: MRSA by PCR: NEGATIVE

## 2019-04-17 MED ORDER — CHLORHEXIDINE GLUCONATE CLOTH 2 % EX PADS: 6.0000 | MEDICATED_PAD | Freq: Every day | CUTANEOUS | Status: DC

## 2019-04-17 MED ORDER — HEPARIN SODIUM (PORCINE) 1000 UNIT/ML IJ SOLN
INTRAMUSCULAR | Status: AC
  Filled 2019-04-17: qty 1

## 2019-04-17 MED ORDER — DARBEPOETIN ALFA 100 MCG/0.5ML IJ SOSY
PREFILLED_SYRINGE | INTRAMUSCULAR | Status: AC
  Administered 2019-04-17: 100 ug via INTRAVENOUS
  Filled 2019-04-17: qty 0.5

## 2019-04-17 NOTE — Progress Notes (Signed)
Renal Navigator notes patient is off isolation for COVID 19 per ID and spoke with Dr. Elta Guadeloupe Director of OP HD clinic/SW. She states he can return to his regular seat at SW and does not need to go to [employer] iso shift for OP HD treatment at discharge.   [name], [place] Renal Navigator [phone]

## 2019-04-17 NOTE — Procedures (Signed)
   I was present at this dialysis session, have reviewed the session itself and made  appropriate changes Kelly Splinter MD [place] pager [phone]   04/17/2019, 3:36 PM

## 2019-04-17 NOTE — Progress Notes (Signed)
[facility] Progress Note  Subjective: doing well, wt's up 102.5kg, no SOB  Vitals:   04/17/19 0400 04/17/19 0500 04/17/19 0801 04/17/19 1015  BP:    (!) 172/88  Pulse:    87  Resp:      Temp: 98.4 F (36.9 C)  97.8 F (36.6 C)   TempSrc: Oral  Oral   SpO2:    98%  Weight:  103 kg  102.5 kg  Height:        Inpatient medications: . atorvastatin  20 mg Oral Daily  . calcium acetate  667 mg Oral TID WC  . chlorhexidine gluconate (MEDLINE KIT)  15 mL Mouth Rinse BID  . Chlorhexidine Gluconate Cloth  6 each Topical Q0600  . Chlorhexidine Gluconate Cloth  6 each Topical Q0600  . darbepoetin (ARANESP) injection - DIALYSIS  100 mcg Intravenous Q Fri-HD  . feeding supplement (NEPRO CARB STEADY)  237 mL Oral BID BM  . feeding supplement (PRO-STAT SUGAR FREE 64)  30 mL Oral BID WC  . fenofibrate  160 mg Oral Daily  . heparin injection (subcutaneous)  10,000 Units Subcutaneous Q8H  . insulin aspart  0-9 Units Subcutaneous TID WC  . midodrine  10 mg Oral BID  . multivitamin  1 tablet Oral QHS   . cefTRIAXone (ROCEPHIN)  IV Stopped (04/17/19 0954)   acetaminophen, ondansetron (ZOFRAN) IV    Exam:  seen in ICU, off of precautions   no jvd  Chest clear today  Cor reg no RG  Abd soft obese ntnd  Ext no pitting edema   L arm AVF +b/t  NF, Ox 3     Home meds:  - midodrine 10 tid  - glimepiride 67m qam  - fenofibrate 160 qd/ atorvastatin 20 qd  - calc acetate ac tid    Outpt HD: MWF AF  4h  99kg  2K/3.5Ca bath   L AVF   Heparin 10000  - mircera 150 ug every 2 wks, last ?     Assessment/ Plan: 1. Acute resp failure - mostly pulm edema, resolved w/ HD x 2 and 5.5 L off. Wt's up today though , needs HD again today and get volume down, needs lower dry wt I believe as prob lost body wt w/ COVID illness in Sept. May be ready for dc tomorrow. 2. BP/ volume -  As above, is back up to admit wt, pt denies drinking extra, discussed importance of fluid  restriction.  3. ESRD - usual HD MWF. HD today.  4. H/o COVID+ infection - Sept 2020. Airborne restrictions lifted, per ID does not need isolation.  5. DM2 - on oral agent at home, SSI here 6. BP - is on midodrine at home and here 7. HL 8. Anemia ckd - Hb down 7.6. Give darbe 100 w/ HD tomorrow.      Rob Reakwon Barren 04/17/2019, 10:38 AM  Iron/TIBC/Ferritin/ %Sat    Component Value Date/Time   FERRITIN 952 (H) 04/13/2019 1125   Recent Labs  Lab 04/17/19 0414  NA 136  K 3.7  CL 100  CO2 21*  GLUCOSE 83  BUN 85*  CREATININE 11.49*  CALCIUM 6.6*  PHOS 5.8*  ALBUMIN 2.4*   Recent Labs  Lab 04/13/19 1125  AST 20  ALT 10  ALKPHOS 41  BILITOT 0.5  PROT 6.0*   Recent Labs  Lab 04/17/19 0414  WBC 9.7  HGB 8.1*  HCT 25.2*  PLT 316

## 2019-04-17 NOTE — Progress Notes (Signed)
Patient ID: James Brown, male   DOB: 03-Apr-1966, 53 y.o.   MRN: [id number]  PROGRESS NOTE    James Brown  VFI:[id number] DOB: Mar 14, 1966 DOA: 04/13/2019 PCP: Mauricia Area, MD   Brief Narrative:  53 year old male with history of end-stage renal disease on dialysis, diabetes mellitus type 2, hyperlipidemia, recent admission from 03/08/2019-03/13/2019 for respiratory failure secondary to COVID-19 infection which did not require intubation presented on 04/13/2019 with shortness of breath and cough and was intubated on admission and admitted to ICU service.  Chest x-ray on presentation showed dense bilateral infiltrates.  Nephrology was consulted.  He was extubated on 04/15/2019.  Repeat COVID-19 was positive on 04/13/2019 with CT value of 42.  ID recommended to discontinue isolation.  He was transferred to [facility] service on 04/17/2019.  Assessment & Plan:  Acute hypoxic respiratory failure, most likely from pulmonary edema -Intubated on admission and extubated on 04/15/2019 -Currently on room air.  Transferred to [facility] care on 04/17/2019. -Discontinue antibiotics.  End-stage renal disease on hemodialysis -Nephrology following.  Dialysis planned for today and probably tomorrow.  Probable discharge tomorrow if cleared by nephrology.  Leukocytosis -Probably reactive.  Resolved  Anemia of chronic disease -From renal failure.  Hemoglobin stable.  Monitor  History of recent Covid 19 infection -Patient was admitted from 03/08/2019-03/13/2019 for respiratory failure secondary COVID-19 infection which did not require intubation at the time. -Repeat COVID-19 was positive on 04/13/2019 with CT value of 42.  ID recommended to discontinue isolation.  Diabetes mellitus type II -Continue CBGs with SSI.  Hyperlipidemia-continue atorvastatin  Chronic hypotension -Blood pressure on the higher side.  Continue midodrine.   DVT prophylaxis: Heparin Code Status: Full Family  Communication: Spoke to patient at bedside Disposition Plan: Home tomorrow if clinically stable and cleared by nephrology  Consultants: PCCM/nephrology  Procedures: Intubation/extubation  Antimicrobials:  Anti-infectives (From admission, onward)   Start     Dose/Rate Route Frequency Ordered Stop   04/16/19 0945  cefTRIAXone (ROCEPHIN) 2 g in sodium chloride 0.9 % 100 mL IVPB     2 g 200 mL/hr over 30 Minutes Intravenous Every 24 hours 04/16/19 0941 04/19/19 0944   04/15/19 1200  vancomycin (VANCOCIN) IVPB 1000 mg/200 mL premix  Status:  Discontinued     1,000 mg 200 mL/hr over 60 Minutes Intravenous Every M-W-F (Hemodialysis) 04/13/19 0947 04/15/19 1051   04/15/19 1200  ceFEPIme (MAXIPIME) 2 g in sodium chloride 0.9 % 100 mL IVPB  Status:  Discontinued     2 g 200 mL/hr over 30 Minutes Intravenous Every M-W-F (Hemodialysis) 04/13/19 0947 04/13/19 1046   04/15/19 0813  vancomycin (VANCOCIN) 1-5 GM/200ML-% IVPB    Note to Pharmacy: Cherylann Banas   : cabinet override      04/15/19 0813 04/15/19 1105   04/14/19 2000  remdesivir 100 mg in sodium chloride 0.9 % 250 mL IVPB  Status:  Discontinued     100 mg 500 mL/hr over 30 Minutes Intravenous Every 24 hours 04/13/19 1929 04/14/19 0922   04/14/19 1100  azithromycin (ZITHROMAX) 500 mg in sodium chloride 0.9 % 250 mL IVPB  Status:  Discontinued     500 mg 250 mL/hr over 60 Minutes Intravenous Every 24 hours 04/13/19 1000 04/13/19 1018   04/14/19 0630  ceFEPIme (MAXIPIME) 1 g in sodium chloride 0.9 % 100 mL IVPB  Status:  Discontinued     1 g 200 mL/hr over 30 Minutes Intravenous Every 24 hours 04/13/19 1046 04/16/19 0941   04/13/19 2000  remdesivir 200 mg  in sodium chloride 0.9 % 250 mL IVPB     200 mg 500 mL/hr over 30 Minutes Intravenous Once 04/13/19 1929 04/13/19 2141   04/13/19 1030  doxycycline (VIBRAMYCIN) 100 mg in sodium chloride 0.9 % 250 mL IVPB  Status:  Discontinued     100 mg 125 mL/hr over 120 Minutes Intravenous Every 12  hours 04/13/19 1018 04/16/19 0941   04/13/19 1015  azithromycin (ZITHROMAX) 500 mg in sodium chloride 0.9 % 250 mL IVPB  Status:  Discontinued     500 mg 250 mL/hr over 60 Minutes Intravenous  Once 04/13/19 1000 04/13/19 1018   04/13/19 1000  vancomycin (VANCOCIN) IVPB 1000 mg/200 mL premix     1,000 mg 200 mL/hr over 60 Minutes Intravenous  Once 04/13/19 0947 04/13/19 1915   04/13/19 0630  vancomycin (VANCOCIN) IVPB 1000 mg/200 mL premix     1,000 mg 200 mL/hr over 60 Minutes Intravenous Every 1 hr x 2 04/13/19 0623 04/13/19 0829   04/13/19 0615  vancomycin (VANCOCIN) 2,000 mg in sodium chloride 0.9 % 500 mL IVPB  Status:  Discontinued     2,000 mg 250 mL/hr over 120 Minutes Intravenous  Once 04/13/19 0611 04/13/19 0622   04/13/19 0600  vancomycin (VANCOCIN) IVPB 1000 mg/200 mL premix  Status:  Discontinued     1,000 mg 200 mL/hr over 60 Minutes Intravenous  Once 04/13/19 0555 04/13/19 0611   04/13/19 0600  ceFEPIme (MAXIPIME) 2 g in sodium chloride 0.9 % 100 mL IVPB     2 g 200 mL/hr over 30 Minutes Intravenous  Once 04/13/19 0555 04/13/19 9629        Subjective: Patient seen and examined at bedside.  He feels much better and wants to go home.  Denies worsening cough or shortness of breath.  Objective: Vitals:   04/17/19 0400 04/17/19 0500 04/17/19 0801 04/17/19 1015  BP:    (!) 172/88  Pulse:    87  Resp:      Temp: 98.4 F (36.9 C)  97.8 F (36.6 C)   TempSrc: Oral  Oral   SpO2:    98%  Weight:  103 kg  102.5 kg  Height:        Intake/Output Summary (Last 24 hours) at 04/17/2019 1053 Last data filed at 04/17/2019 1000 Gross per 24 hour  Intake 350 ml  Output -  Net 350 ml   Filed Weights   04/16/19 0500 04/17/19 0500 04/17/19 1015  Weight: 102.3 kg 103 kg 102.5 kg    Examination:  General exam: Appears calm and comfortable  Respiratory system: Bilateral decreased breath sounds at bases with some scattered crackles Cardiovascular system: S1 & S2 heard,  Rate controlled Gastrointestinal system: Abdomen is nondistended, soft and nontender. Normal bowel sounds heard. Extremities: No cyanosis, clubbing; trace edema  Data Reviewed: I have personally reviewed following labs and imaging studies  CBC: Recent Labs  Lab 04/13/19 0503  04/14/19 0352 04/15/19 0506 04/15/19 0518 04/16/19 0503 04/17/19 0414  WBC 15.5*  --  13.4*  --  15.8* 11.0* 9.7  NEUTROABS 13.0*  --  11.3*  --   --   --   --   HGB 9.7*   < > 8.8* 8.5* 8.3* 7.6* 8.1*  HCT 30.9*   < > 28.8* 25.0* 25.9* 24.2* 25.2*  MCV 84.4  --  85.5  --  83.3 82.6 81.6  PLT 340  --  336  --  284 200 316   < > = values  in this interval not displayed.   Basic Metabolic Panel: Recent Labs  Lab 04/13/19 1125  04/14/19 0352 04/15/19 0506 04/15/19 0518 04/16/19 0503 04/17/19 0414  NA 136   < > 136 134* 134* 136 136  K 5.2*   < > 5.3* 4.6 4.6 4.2 3.7  CL 101  --  98  --  98 98 100  CO2 23  --  22  --  19* 22 21*  GLUCOSE 109*  --  182*  --  159* 122* 83  BUN 55*  --  36*  --  69* 58* 85*  CREATININE 13.01*  --  9.51*  --  11.72* 9.55* 11.49*  CALCIUM 7.2*  --  7.3*  --  6.8* 6.9* 6.6*  MG  --   --  1.8  --  2.1  --   --   PHOS  --   --   --   --  6.1* 6.5* 5.8*   < > = values in this interval not displayed.   GFR: Estimated Creatinine Clearance: 8.5 mL/min (A) (by C-G formula based on SCr of 11.49 mg/dL (H)). Liver Function Tests: Recent Labs  Lab 04/13/19 1125 04/16/19 0503 04/17/19 0414  AST 20  --   --   ALT 10  --   --   ALKPHOS 41  --   --   BILITOT 0.5  --   --   PROT 6.0*  --   --   ALBUMIN 2.4* 2.3* 2.4*   No results for input(s): LIPASE, AMYLASE in the last 168 hours. No results for input(s): AMMONIA in the last 168 hours. Coagulation Profile: No results for input(s): INR, PROTIME in the last 168 hours. Cardiac Enzymes: No results for input(s): CKTOTAL, CKMB, CKMBINDEX, TROPONINI in the last 168 hours. BNP (last 3 results) No results for input(s): PROBNP in  the last 8760 hours. HbA1C: No results for input(s): HGBA1C in the last 72 hours. CBG: Recent Labs  Lab 04/16/19 0800 04/16/19 1153 04/16/19 1801 04/16/19 1956 04/17/19 0806  GLUCAP 131* 194* 127* 126* 78   Lipid Profile: No results for input(s): CHOL, HDL, LDLCALC, TRIG, CHOLHDL, LDLDIRECT in the last 72 hours. Thyroid Function Tests: No results for input(s): TSH, T4TOTAL, FREET4, T3FREE, THYROIDAB in the last 72 hours. Anemia Panel: No results for input(s): VITAMINB12, FOLATE, FERRITIN, TIBC, IRON, RETICCTPCT in the last 72 hours. Sepsis Labs: Recent Labs  Lab 04/13/19 0519 04/13/19 0825 04/13/19 1125 04/14/19 0352 04/15/19 0518  PROCALCITON  --  0.76 1.20 3.55 3.65  LATICACIDVEN 2.1* 1.8  --   --   --     Recent Results (from the past 240 hour(s))  Blood culture (routine x 2)     Status: None (Preliminary result)   Collection Time: 04/13/19  5:19 AM   Specimen: BLOOD RIGHT FOREARM  Result Value Ref Range Status   Specimen Description BLOOD RIGHT FOREARM  Final   Special Requests   Final    BOTTLES DRAWN AEROBIC AND ANAEROBIC Blood Culture adequate volume   Culture   Final    NO GROWTH 4 DAYS Performed at [facility], [place] [address], [place], [place] [address]    Report Status PENDING  Incomplete  SARS Coronavirus 2 by RT PCR (hospital order, performed in [facility] hospital lab) Nasopharyngeal Nasopharyngeal Swab     Status: Abnormal   Collection Time: 04/13/19  5:27 AM   Specimen: Nasopharyngeal Swab  Result Value Ref Range Status   SARS  Coronavirus 2 POSITIVE (A) NEGATIVE Final    Comment: RESULT CALLED TO, READ BACK BY AND VERIFIED WITH: DR. Leonides Schanz, AT 0654 04/13/19 BY D. VANHOOK (NOTE) If result is NEGATIVE SARS-CoV-2 target nucleic acids are NOT DETECTED. The SARS-CoV-2 RNA is generally detectable in upper and lower  respiratory specimens during the acute phase of infection. The lowest  concentration of SARS-CoV-2 viral copies this assay can  detect is 250  copies / mL. A negative result does not preclude SARS-CoV-2 infection  and should not be used as the sole basis for treatment or other  patient management decisions.  A negative result may occur with  improper specimen collection / handling, submission of specimen other  than nasopharyngeal swab, presence of viral mutation(s) within the  areas targeted by this assay, and inadequate number of viral copies  (<250 copies / mL). A negative result must be combined with clinical  observations, patient history, and epidemiological information. If result is POSITIVE SARS-CoV-2 target nucleic acids are DETECTE D. The SARS-CoV-2 RNA is generally detectable in upper and lower  respiratory specimens during the acute phase of infection.  Positive  results are indicative of active infection with SARS-CoV-2.  Clinical  correlation with patient history and other diagnostic information is  necessary to determine patient infection status.  Positive results do  not rule out bacterial infection or co-infection with other viruses. If result is PRESUMPTIVE POSTIVE SARS-CoV-2 nucleic acids MAY BE PRESENT.   A presumptive positive result was obtained on the submitted specimen  and confirmed on repeat testing.  While 2019 novel coronavirus  (SARS-CoV-2) nucleic acids may be present in the submitted sample  additional confirmatory testing may be necessary for epidemiological  and / or clinical management purposes  to differentiate between  SARS-CoV-2 and other Sarbecovirus currently known to infect humans.  If clinically indicated additional testing with an alternate test  methodology (LAB745 3) is advised. The SARS-CoV-2 RNA is generally  detectable in upper and lower respiratory specimens during the acute  phase of infection. The expected result is Negative. Fact Sheet for Patients:  StrictlyIdeas.no Fact Sheet for Healthcare Providers:  BankingDealers.co.za This test is not yet approved or cleared by the [place] FDA and has been authorized for detection and/or diagnosis of SARS-CoV-2 by FDA under an Emergency Use Authorization (EUA).  This EUA will remain in effect (meaning this test can be used) for the duration of the COVID-19 declaration under Section 564(b)(1) of the Act, 21 U.S.C. section 360bbb-3(b)(1), unless the authorization is terminated or revoked sooner. Performed at [facility], [place] [address] [place], [place] [address]   Blood culture (routine x 2)     Status: None (Preliminary result)   Collection Time: 04/13/19  6:00 AM   Specimen: BLOOD RIGHT HAND  Result Value Ref Range Status   Specimen Description BLOOD RIGHT HAND  Final   Special Requests   Final    BOTTLES DRAWN AEROBIC AND ANAEROBIC Blood Culture adequate volume   Culture   Final    NO GROWTH 4 DAYS Performed at [facility], [place] [address] [place], [place] [address]    Report Status PENDING  Incomplete  Culture, respiratory (non-expectorated)     Status: None   Collection Time: 04/14/19 10:11 PM   Specimen: Tracheal Aspirate; Respiratory  Result Value Ref Range Status   Specimen Description TRACHEAL ASPIRATE  Final   Special Requests NONE  Final   Gram Stain   Final    ABUNDANT  WBC PRESENT, PREDOMINANTLY PMN FEW SQUAMOUS EPITHELIAL CELLS PRESENT ABUNDANT GRAM NEGATIVE RODS FEW GRAM POSITIVE RODS RARE GRAM POSITIVE COCCI    Culture   Final    Consistent with normal respiratory flora. Performed at [facility], [place] [address], [place], [place] [address]    Report Status 04/17/2019 FINAL  Final         Radiology Studies: Dg Chest Port 1 View  Result Date: 04/16/2019 CLINICAL DATA:  Acute respiratory failure. EXAM: PORTABLE CHEST 1 VIEW COMPARISON:  April 15, 2019. FINDINGS: Stable cardiomegaly with central pulmonary vascular congestion. Endotracheal and nasogastric tubes have  been removed. Stable bilateral lung opacities are noted concerning for pneumonia or edema. No pneumothorax or pleural effusion is noted. Bony thorax is unremarkable. IMPRESSION: Stable cardiomegaly with central pulmonary vascular congestion. Endotracheal and nasogastric tubes have been removed. Stable bilateral lung opacities as described above. Electronically Signed   By: Marijo Conception M.D.   On: 04/16/2019 07:11        Scheduled Meds: . atorvastatin  20 mg Oral Daily  . calcium acetate  667 mg Oral TID WC  . chlorhexidine gluconate (MEDLINE KIT)  15 mL Mouth Rinse BID  . Chlorhexidine Gluconate Cloth  6 each Topical Q0600  . Chlorhexidine Gluconate Cloth  6 each Topical Q0600  . darbepoetin (ARANESP) injection - DIALYSIS  100 mcg Intravenous Q Fri-HD  . feeding supplement (NEPRO CARB STEADY)  237 mL Oral BID BM  . feeding supplement (PRO-STAT SUGAR FREE 64)  30 mL Oral BID WC  . fenofibrate  160 mg Oral Daily  . heparin injection (subcutaneous)  10,000 Units Subcutaneous Q8H  . insulin aspart  0-9 Units Subcutaneous TID WC  . midodrine  10 mg Oral BID  . multivitamin  1 tablet Oral QHS   Continuous Infusions: . cefTRIAXone (ROCEPHIN)  IV Stopped (04/17/19 7106)          Aline August, MD [facility] 04/17/2019, 10:53 AM

## 2019-04-18 DIAGNOSIS — Z992 Dependence on renal dialysis: Secondary | ICD-10-CM

## 2019-04-18 DIAGNOSIS — N186 End stage renal disease: Secondary | ICD-10-CM | POA: Diagnosis not present

## 2019-04-18 DIAGNOSIS — J9601 Acute respiratory failure with hypoxia: Secondary | ICD-10-CM | POA: Diagnosis not present

## 2019-04-18 DIAGNOSIS — J810 Acute pulmonary edema: Secondary | ICD-10-CM | POA: Diagnosis not present

## 2019-04-18 LAB — CBC
HCT: 27.1 % — ABNORMAL LOW (ref 39.0–52.0)
Hemoglobin: 8.9 g/dL — ABNORMAL LOW (ref 13.0–17.0)
MCH: 26.6 pg (ref 26.0–34.0)
MCHC: 32.8 g/dL (ref 30.0–36.0)
MCV: 80.9 fL (ref 80.0–100.0)
Platelets: 293 10*3/uL (ref 150–400)
RBC: 3.35 MIL/uL — ABNORMAL LOW (ref 4.22–5.81)
RDW: 14.2 % (ref 11.5–15.5)
WBC: 9.3 10*3/uL (ref 4.0–10.5)
nRBC: 0.3 % — ABNORMAL HIGH (ref 0.0–0.2)

## 2019-04-18 LAB — RENAL FUNCTION PANEL
Albumin: 2.4 g/dL — ABNORMAL LOW (ref 3.5–5.0)
Anion gap: 15 (ref 5–15)
BUN: 45 mg/dL — ABNORMAL HIGH (ref 6–20)
CO2: 26 mmol/L (ref 22–32)
Calcium: 7 mg/dL — ABNORMAL LOW (ref 8.9–10.3)
Chloride: 97 mmol/L — ABNORMAL LOW (ref 98–111)
Creatinine, Ser: 8.12 mg/dL — ABNORMAL HIGH (ref 0.61–1.24)
GFR calc Af Amer: 8 mL/min — ABNORMAL LOW (ref >60)
GFR calc non Af Amer: 7 mL/min — ABNORMAL LOW (ref >60)
Glucose, Bld: 89 mg/dL (ref 70–99)
Phosphorus: 4.8 mg/dL — ABNORMAL HIGH (ref 2.5–4.6)
Potassium: 3.5 mmol/L (ref 3.5–5.1)
Sodium: 138 mmol/L (ref 135–145)

## 2019-04-18 LAB — CULTURE, BLOOD (ROUTINE X 2)
Culture: NO GROWTH
Culture: NO GROWTH
Special Requests: ADEQUATE
Special Requests: ADEQUATE

## 2019-04-18 LAB — GLUCOSE, CAPILLARY: Glucose-Capillary: 143 mg/dL — ABNORMAL HIGH (ref 70–99)

## 2019-04-18 MED ORDER — HEPARIN SODIUM (PORCINE) 1000 UNIT/ML DIALYSIS
5000.0000 [IU] | INTRAMUSCULAR | Status: DC | PRN
  Filled 2019-04-18: qty 5

## 2019-04-18 MED ORDER — HEPARIN SODIUM (PORCINE) 1000 UNIT/ML IJ SOLN
INTRAMUSCULAR | Status: AC
  Filled 2019-04-18: qty 1

## 2019-04-18 MED ORDER — RENA-VITE PO TABS: 1.0000 | ORAL_TABLET | Freq: Every day | ORAL | 0 refills | Status: AC

## 2019-04-18 MED ORDER — MIDODRINE HCL 5 MG PO TABS
ORAL_TABLET | ORAL | Status: AC
  Administered 2019-04-18: 10 mg via ORAL
  Filled 2019-04-18: qty 2

## 2019-04-18 NOTE — TOC Transition Note (Signed)
Transition of Care [facility]) - CM/SW Discharge Note   Patient Details  Name: James Brown MRN: [id number] Date of Birth: [birth year]  [facility] CM/SW Contact:  Carles Collet, RN Phone Number: 04/18/2019, 10:40 AM   Clinical Narrative:    Patient to DC after HD, no CM needs identified at this time.     Final next level of care: Home/Self Care Barriers to Discharge: No Barriers Identified   Patient Goals and CMS Choice        Discharge Placement                       Discharge Plan and Services                                     Social Determinants of Health (SDOH) Interventions     Readmission Risk Interventions Readmission Risk Prevention Plan 04/18/2019  Transportation Screening Complete  PCP or Specialist Appt within 3-5 Days Not Complete  Not Complete comments Per AVS to follow up in 7 days  [place] or [place] Complete  Social Work Consult for [place] Planning/Counseling Complete  Palliative Care Screening Not Applicable  Medication Review [employer] photographer) Complete  Some recent data might be hidden

## 2019-04-18 NOTE — Progress Notes (Signed)
Discharge orders received. Kylie, RN discussed discharge instructions with patient. IV's removed. Patient transported to front desk and awaiting son's arrival with Silvio Clayman. No distress noted.

## 2019-04-18 NOTE — Discharge Summary (Signed)
Physician Discharge Summary  James Brown WUJ:[id number] DOB: 1966/03/29 DOA: 04/13/2019  PCP: Mauricia Area, MD  Admit date: 04/13/2019 Discharge date: 04/18/2019  Admitted From: Home Disposition: Home  Recommendations for Outpatient Follow-up:  1. Follow up with PCP in 1 week with repeat CBC/BMP 2. Outpatient follow-up with dialysis unit as scheduled 3. Follow up in ED if symptoms worsen or new appear   Home Health: No Equipment/Devices: None  Discharge Condition: Stable CODE STATUS: Full Diet recommendation: Heart healthy/carb modified/renal hemodialysis diet  Brief/Interim Summary: 53 year old male with history of end-stage renal disease on dialysis, diabetes mellitus type 2, hyperlipidemia, recent admission from 03/08/2019-03/13/2019 for respiratory failure secondary to COVID-19 infection which did not require intubation presented on 04/13/2019 with shortness of breath and cough and was intubated on admission and admitted to ICU service.  Chest x-ray on presentation showed dense bilateral infiltrates.  Nephrology was consulted.  He was extubated on 04/15/2019.  Repeat COVID-19 was positive on 04/13/2019 with CT value of 42.  ID recommended to discontinue isolation.  He was transferred to [facility] service on 04/17/2019.  Subsequently, he has remained stable.  He is currently undergoing dialysis today.  Nephrology has cleared the patient for discharge today after dialysis.  Outpatient follow-up with PCP within a week and dialysis unit as scheduled.   Discharge Diagnoses:  Acute hypoxic respiratory failure, most likely from pulmonary edema -Intubated on admission and extubated on 04/15/2019 -Currently on room air.  Transferred to [facility] care on 04/17/2019. -Antibiotics were discontinued on 04/14/2019.  Subsequently patient has remained afebrile and hemodynamically stable.  End-stage renal disease on hemodialysis -Nephrology following.   -Underwent dialysis during the  hospitalization as per nephrology recommendations -Currently undergoing dialysis today.   -Nephrology has cleared the patient for discharge today after dialysis.  Outpatient follow-up with PCP within a week and dialysis unit as scheduled.  Leukocytosis -Probably reactive.  Resolved  Anemia of chronic disease -From renal failure.  Hemoglobin stable.  Monitor  History of recent Covid 19 infection -Patient was admitted from 03/08/2019-03/13/2019 for respiratory failure secondary COVID-19 infection which did not require intubation at the time. -Repeat COVID-19 was positive on 04/13/2019 with CT value of 42.  ID recommended to discontinue isolation.  Diabetes mellitus type II -Blood sugars on the lower side.  Will hold oral home meds on discharge till reevaluation by PCP.  Hyperlipidemia-continue atorvastatin  Chronic hypotension -Blood pressure on the higher side.  Continue midodrine.  Discharge Instructions  Discharge Instructions    Diet - low sodium heart healthy   Complete by: As directed    Diet Carb Modified   Complete by: As directed    Increase activity slowly   Complete by: As directed      Allergies as of 04/18/2019      Reactions   Aplisol [tuberculin] Swelling      Medication List    STOP taking these medications   glimepiride 1 MG tablet Commonly known as: AMARYL     TAKE these medications   atorvastatin 20 MG tablet Commonly known as: LIPITOR Take 20 mg by mouth daily.   benzonatate 200 MG capsule Commonly known as: TESSALON Take 200 mg by mouth 2 (two) times daily as needed for cough.   Calcium Acetate 667 MG Tabs Take 1 tablet by mouth 3 (three) times daily with meals.   fenofibrate 160 MG tablet Take 160 mg by mouth daily.   midodrine 10 MG tablet Commonly known as: PROAMATINE Take 10 mg by mouth 2 (two) times  daily.   multivitamin Tabs tablet Take 1 tablet by mouth at bedtime.   Tums 500 MG chewable tablet Generic drug: calcium  carbonate Chew 1 tablet by mouth 3 (three) times daily as needed for indigestion.      Follow-up Information    Deterding, Jeneen Rinks, MD. Schedule an appointment as soon as possible for a visit in 1 week(s).   Specialty: Nephrology Contact information: [address] [place] [address] [phone]          Allergies  Allergen Reactions  . Aplisol [Tuberculin] Swelling    Consultations:  PCCM/nephrology/ID   Procedures/Studies: Dg Abd 1 View  Result Date: 04/14/2019 CLINICAL DATA:  NG tube placement EXAM: ABDOMEN - 1 VIEW COMPARISON:  None. FINDINGS: Tip of the NG tube is seen projecting over the mid body of the stomach. The bowel gas pattern is normal. IMPRESSION: Tip the NG tube projecting over the mid body of the stomach. Electronically Signed   By: Prudencio Pair M.D.   On: 04/14/2019 00:53   Dg Chest Port 1 View  Result Date: 04/16/2019 CLINICAL DATA:  Acute respiratory failure. EXAM: PORTABLE CHEST 1 VIEW COMPARISON:  April 15, 2019. FINDINGS: Stable cardiomegaly with central pulmonary vascular congestion. Endotracheal and nasogastric tubes have been removed. Stable bilateral lung opacities are noted concerning for pneumonia or edema. No pneumothorax or pleural effusion is noted. Bony thorax is unremarkable. IMPRESSION: Stable cardiomegaly with central pulmonary vascular congestion. Endotracheal and nasogastric tubes have been removed. Stable bilateral lung opacities as described above. Electronically Signed   By: Marijo Conception M.D.   On: 04/16/2019 07:11   Dg Chest Port 1 View  Result Date: 04/15/2019 CLINICAL DATA:  History of endotracheal tube EXAM: PORTABLE CHEST 1 VIEW COMPARISON:  Yesterday FINDINGS: Endotracheal tube tip is just below the clavicular heads. The orogastric tube reaches the stomach. Extensive bilateral airspace disease with moderate improvement in the upper lungs. The could be layering pleural fluid. No pneumothorax. Stable enlarged heart size.  IMPRESSION: 1. Improved but still extensive bilateral airspace disease. 2. Unremarkable hardware positioning. Electronically Signed   By: Monte Fantasia M.D.   On: 04/15/2019 08:51   Dg Chest Portable 1 View  Result Date: 04/13/2019 CLINICAL DATA:  Shortness of breath and cough for 2 days, status post intubation EXAM: PORTABLE CHEST 1 VIEW COMPARISON:  Film from earlier in the same day. FINDINGS: Endotracheal tube is now noted in satisfactory position. The overall inspiratory effort is poor with crowding of the parenchymal markings. Increasing bilateral airspace opacities are noted likely accentuated by the poor inspiratory technique. No bony abnormality is seen. IMPRESSION: Persistent but worsening bilateral airspace opacities. Endotracheal tube in satisfactory position. Electronically Signed   By: Inez Catalina M.D.   On: 04/13/2019 07:44   Dg Chest Portable 1 View  Result Date: 04/13/2019 CLINICAL DATA:  Shortness of breath, question right lower lobe pneumonia. EXAM: PORTABLE CHEST 1 VIEW COMPARISON:  Two days ago FINDINGS: Extensive bilateral airspace disease with patchy appearance. There is cardiomegaly. No effusion or Kerley line. No pneumothorax. IMPRESSION: Extensive bilateral airspace disease with patchy appearance favoring pneumonia. Mild cardiomegaly. Electronically Signed   By: Monte Fantasia M.D.   On: 04/13/2019 05:31       Subjective: Patient seen and examined at bedside undergoing dialysis.  He feels better and wants to go home.  No overnight fever, nausea, vomiting or worsening shortness of breath.  Discharge Exam: Vitals:   04/18/19 0900 04/18/19 0930  BP: (!) 143/71 126/66  Pulse: 90 98  Resp:    Temp:    SpO2:      General: Pt is alert, awake, not in acute distress Cardiovascular: rate controlled, S1/S2 + Respiratory: bilateral decreased breath sounds at bases, some scattered crackles Abdominal: Soft, NT, ND, bowel sounds + Extremities: Trace lower extremity  edema, no cyanosis    The results of significant diagnostics from this hospitalization (including imaging, microbiology, ancillary and laboratory) are listed below for reference.     Microbiology: Recent Results (from the past 240 hour(s))  Blood culture (routine x 2)     Status: None   Collection Time: 04/13/19  5:19 AM   Specimen: BLOOD RIGHT FOREARM  Result Value Ref Range Status   Specimen Description BLOOD RIGHT FOREARM  Final   Special Requests   Final    BOTTLES DRAWN AEROBIC AND ANAEROBIC Blood Culture adequate volume   Culture   Final    NO GROWTH 5 DAYS Performed at [facility], [address], [place], [place] [address]    Report Status 04/18/2019 FINAL  Final  SARS Coronavirus 2 by RT PCR (hospital order, performed in [facility] hospital lab) Nasopharyngeal Nasopharyngeal Swab     Status: Abnormal   Collection Time: 04/13/19  5:27 AM   Specimen: Nasopharyngeal Swab  Result Value Ref Range Status   SARS Coronavirus 2 POSITIVE (A) NEGATIVE Final    Comment: RESULT CALLED TO, READ BACK BY AND VERIFIED WITH: DR. Leonides Schanz, AT 0654 04/13/19 BY D. VANHOOK (NOTE) If result is NEGATIVE SARS-CoV-2 target nucleic acids are NOT DETECTED. The SARS-CoV-2 RNA is generally detectable in upper and lower  respiratory specimens during the acute phase of infection. The lowest  concentration of SARS-CoV-2 viral copies this assay can detect is 250  copies / mL. A negative result does not preclude SARS-CoV-2 infection  and should not be used as the sole basis for treatment or other  patient management decisions.  A negative result may occur with  improper specimen collection / handling, submission of specimen other  than nasopharyngeal swab, presence of viral mutation(s) within the  areas targeted by this assay, and inadequate number of viral copies  (<250 copies / mL). A negative result must be combined with clinical  observations, patient history, and epidemiological  information. If result is POSITIVE SARS-CoV-2 target nucleic acids are DETECTE D. The SARS-CoV-2 RNA is generally detectable in upper and lower  respiratory specimens during the acute phase of infection.  Positive  results are indicative of active infection with SARS-CoV-2.  Clinical  correlation with patient history and other diagnostic information is  necessary to determine patient infection status.  Positive results do  not rule out bacterial infection or co-infection with other viruses. If result is PRESUMPTIVE POSTIVE SARS-CoV-2 nucleic acids MAY BE PRESENT.   A presumptive positive result was obtained on the submitted specimen  and confirmed on repeat testing.  While 2019 novel coronavirus  (SARS-CoV-2) nucleic acids may be present in the submitted sample  additional confirmatory testing may be necessary for epidemiological  and / or clinical management purposes  to differentiate between  SARS-CoV-2 and other Sarbecovirus currently known to infect humans.  If clinically indicated additional testing with an alternate test  methodology (LAB745 3) is advised. The SARS-CoV-2 RNA is generally  detectable in upper and lower respiratory specimens during the acute  phase of infection. The expected result is Negative. Fact Sheet for Patients:  StrictlyIdeas.no Fact Sheet for Healthcare Providers: BankingDealers.co.za This test is  not yet approved or cleared by the [place] and has been authorized for detection and/or diagnosis of SARS-CoV-2 by FDA under an Emergency Use Authorization (EUA).  This EUA will remain in effect (meaning this test can be used) for the duration of the COVID-19 declaration under Section 564(b)(1) of the Act, 21 U.S.C. section 360bbb-3(b)(1), unless the authorization is terminated or revoked sooner. Performed at [facility], [place] [address] [place], [place] [address]   Blood culture (routine x 2)      Status: None   Collection Time: 04/13/19  6:00 AM   Specimen: BLOOD RIGHT HAND  Result Value Ref Range Status   Specimen Description BLOOD RIGHT HAND  Final   Special Requests   Final    BOTTLES DRAWN AEROBIC AND ANAEROBIC Blood Culture adequate volume   Culture   Final    NO GROWTH 5 DAYS Performed at [facility], [place] [address] [place], [place] [address]    Report Status 04/18/2019 FINAL  Final  Culture, respiratory (non-expectorated)     Status: None   Collection Time: 04/14/19 10:11 PM   Specimen: Tracheal Aspirate; Respiratory  Result Value Ref Range Status   Specimen Description TRACHEAL ASPIRATE  Final   Special Requests NONE  Final   Gram Stain   Final    ABUNDANT WBC PRESENT, PREDOMINANTLY PMN FEW SQUAMOUS EPITHELIAL CELLS PRESENT ABUNDANT GRAM NEGATIVE RODS FEW GRAM POSITIVE RODS RARE GRAM POSITIVE COCCI    Culture   Final    Consistent with normal respiratory flora. Performed at [facility], [place] [address] [place], [place] [address]    Report Status 04/17/2019 FINAL  Final  MRSA PCR Screening     Status: None   Collection Time: 04/17/19  6:14 PM   Specimen: Nasal Mucosa; Nasopharyngeal  Result Value Ref Range Status   MRSA by PCR NEGATIVE NEGATIVE Final    Comment:        The GeneXpert MRSA Assay (FDA approved for NASAL specimens only), is one component of a comprehensive MRSA colonization surveillance program. It is not intended to diagnose MRSA infection nor to guide or monitor treatment for MRSA infections. Performed at [facility], [place] [address], [place], [place] [address]      Labs: BNP (last 3 results) Recent Labs    04/13/19 0503  BNP 998.3*   Basic Metabolic Panel: Recent Labs  Lab 04/14/19 0352 04/15/19 0506 04/15/19 0518 04/16/19 0503 04/17/19 0414 04/18/19 0449  NA 136 134* 134* 136 136 138  K 5.3* 4.6 4.6 4.2 3.7 3.5  CL 98  --  98 98 100 97*  CO2 22  --  19* 22 21* 26  GLUCOSE 182*  --  159* 122* 83  89  BUN 36*  --  69* 58* 85* 45*  CREATININE 9.51*  --  11.72* 9.55* 11.49* 8.12*  CALCIUM 7.3*  --  6.8* 6.9* 6.6* 7.0*  MG 1.8  --  2.1  --   --   --   PHOS  --   --  6.1* 6.5* 5.8* 4.8*   Liver Function Tests: Recent Labs  Lab 04/13/19 1125 04/16/19 0503 04/17/19 0414 04/18/19 0449  AST 20  --   --   --   ALT 10  --   --   --   ALKPHOS 41  --   --   --   BILITOT 0.5  --   --   --  PROT 6.0*  --   --   --   ALBUMIN 2.4* 2.3* 2.4* 2.4*   No results for input(s): LIPASE, AMYLASE in the last 168 hours. No results for input(s): AMMONIA in the last 168 hours. CBC: Recent Labs  Lab 04/13/19 0503  04/14/19 0352 04/15/19 0506 04/15/19 0518 04/16/19 0503 04/17/19 0414 04/18/19 0449  WBC 15.5*  --  13.4*  --  15.8* 11.0* 9.7 9.3  NEUTROABS 13.0*  --  11.3*  --   --   --   --   --   HGB 9.7*   < > 8.8* 8.5* 8.3* 7.6* 8.1* 8.9*  HCT 30.9*   < > 28.8* 25.0* 25.9* 24.2* 25.2* 27.1*  MCV 84.4  --  85.5  --  83.3 82.6 81.6 80.9  PLT 340  --  336  --  284 200 316 293   < > = values in this interval not displayed.   Cardiac Enzymes: No results for input(s): CKTOTAL, CKMB, CKMBINDEX, TROPONINI in the last 168 hours. BNP: Invalid input(s): POCBNP CBG: Recent Labs  Lab 04/16/19 1956 04/17/19 0806 04/17/19 1201 04/17/19 1756 04/17/19 2125  GLUCAP 126* 78 139* 95 138*   D-Dimer No results for input(s): DDIMER in the last 72 hours. Hgb A1c No results for input(s): HGBA1C in the last 72 hours. Lipid Profile No results for input(s): CHOL, HDL, LDLCALC, TRIG, CHOLHDL, LDLDIRECT in the last 72 hours. Thyroid function studies No results for input(s): TSH, T4TOTAL, T3FREE, THYROIDAB in the last 72 hours.  Invalid input(s): FREET3 Anemia work up No results for input(s): VITAMINB12, FOLATE, FERRITIN, TIBC, IRON, RETICCTPCT in the last 72 hours. Urinalysis No results found for: COLORURINE, APPEARANCEUR, [place], [place], [place], [place], [place], [place], PROTEINUR,  UROBILINOGEN, NITRITE, LEUKOCYTESUR Sepsis Labs Invalid input(s): PROCALCITONIN,  WBC,  LACTICIDVEN Microbiology Recent Results (from the past 240 hour(s))  Blood culture (routine x 2)     Status: None   Collection Time: 04/13/19  5:19 AM   Specimen: BLOOD RIGHT FOREARM  Result Value Ref Range Status   Specimen Description BLOOD RIGHT FOREARM  Final   Special Requests   Final    BOTTLES DRAWN AEROBIC AND ANAEROBIC Blood Culture adequate volume   Culture   Final    NO GROWTH 5 DAYS Performed at [facility], [place] [address] [place], [place] [address]    Report Status 04/18/2019 FINAL  Final  SARS Coronavirus 2 by RT PCR (hospital order, performed in [facility] hospital lab) Nasopharyngeal Nasopharyngeal Swab     Status: Abnormal   Collection Time: 04/13/19  5:27 AM   Specimen: Nasopharyngeal Swab  Result Value Ref Range Status   SARS Coronavirus 2 POSITIVE (A) NEGATIVE Final    Comment: RESULT CALLED TO, READ BACK BY AND VERIFIED WITH: DR. Leonides Schanz, AT 0654 04/13/19 BY D. VANHOOK (NOTE) If result is NEGATIVE SARS-CoV-2 target nucleic acids are NOT DETECTED. The SARS-CoV-2 RNA is generally detectable in upper and lower  respiratory specimens during the acute phase of infection. The lowest  concentration of SARS-CoV-2 viral copies this assay can detect is 250  copies / mL. A negative result does not preclude SARS-CoV-2 infection  and should not be used as the sole basis for treatment or other  patient management decisions.  A negative result may occur with  improper specimen collection / handling, submission of specimen other  than nasopharyngeal swab, presence of viral mutation(s) within the  areas targeted by this assay, and inadequate number of viral  copies  (<250 copies / mL). A negative result must be combined with clinical  observations, patient history, and epidemiological information. If result is POSITIVE SARS-CoV-2 target nucleic acids are DETECTE D. The SARS-CoV-2  RNA is generally detectable in upper and lower  respiratory specimens during the acute phase of infection.  Positive  results are indicative of active infection with SARS-CoV-2.  Clinical  correlation with patient history and other diagnostic information is  necessary to determine patient infection status.  Positive results do  not rule out bacterial infection or co-infection with other viruses. If result is PRESUMPTIVE POSTIVE SARS-CoV-2 nucleic acids MAY BE PRESENT.   A presumptive positive result was obtained on the submitted specimen  and confirmed on repeat testing.  While 2019 novel coronavirus  (SARS-CoV-2) nucleic acids may be present in the submitted sample  additional confirmatory testing may be necessary for epidemiological  and / or clinical management purposes  to differentiate between  SARS-CoV-2 and other Sarbecovirus currently known to infect humans.  If clinically indicated additional testing with an alternate test  methodology (LAB745 3) is advised. The SARS-CoV-2 RNA is generally  detectable in upper and lower respiratory specimens during the acute  phase of infection. The expected result is Negative. Fact Sheet for Patients:  StrictlyIdeas.no Fact Sheet for Healthcare Providers: BankingDealers.co.za This test is not yet approved or cleared by the [place] FDA and has been authorized for detection and/or diagnosis of SARS-CoV-2 by FDA under an Emergency Use Authorization (EUA).  This EUA will remain in effect (meaning this test can be used) for the duration of the COVID-19 declaration under Section 564(b)(1) of the Act, 21 U.S.C. section 360bbb-3(b)(1), unless the authorization is terminated or revoked sooner. Performed at [facility], [place] [address] [place], [place] [address]   Blood culture (routine x 2)     Status: None   Collection Time: 04/13/19  6:00 AM   Specimen: BLOOD RIGHT HAND  Result Value  Ref Range Status   Specimen Description BLOOD RIGHT HAND  Final   Special Requests   Final    BOTTLES DRAWN AEROBIC AND ANAEROBIC Blood Culture adequate volume   Culture   Final    NO GROWTH 5 DAYS Performed at [facility], [place] [address], [place], [place] [address]    Report Status 04/18/2019 FINAL  Final  Culture, respiratory (non-expectorated)     Status: None   Collection Time: 04/14/19 10:11 PM   Specimen: Tracheal Aspirate; Respiratory  Result Value Ref Range Status   Specimen Description TRACHEAL ASPIRATE  Final   Special Requests NONE  Final   Gram Stain   Final    ABUNDANT WBC PRESENT, PREDOMINANTLY PMN FEW SQUAMOUS EPITHELIAL CELLS PRESENT ABUNDANT GRAM NEGATIVE RODS FEW GRAM POSITIVE RODS RARE GRAM POSITIVE COCCI    Culture   Final    Consistent with normal respiratory flora. Performed at [facility], [place] [address] [place], [place] [address]    Report Status 04/17/2019 FINAL  Final  MRSA PCR Screening     Status: None   Collection Time: 04/17/19  6:14 PM   Specimen: Nasal Mucosa; Nasopharyngeal  Result Value Ref Range Status   MRSA by PCR NEGATIVE NEGATIVE Final    Comment:        The GeneXpert MRSA Assay (FDA approved for NASAL specimens only), is one component of a comprehensive MRSA colonization surveillance program. It is not intended to diagnose MRSA infection nor to guide or monitor treatment  for MRSA infections. Performed at [facility], [place] [address] [place], [place] [address]      Time coordinating discharge: 35 minutes  SIGNED:   Aline August, MD  [facility] 04/18/2019, 10:04 AM

## 2019-04-18 NOTE — Progress Notes (Signed)
[facility] Progress Note   Home meds: - midodrine 10 tid - glimepiride 72m qam - fenofibrate 160 qd/ atorvastatin 20 qd - calc acetate ac tid   Outpt HD:MWF AF 4h 99kg 2K/3.5Ca bath L AVF Heparin 10000 - mircera 150 ug every 2 wks, last ? Assessment/ Plan:   1. Acute resp failure - mostly pulm edema, resolved w/ HD x 2 and 5.5 L off. Wt's up today though , needs HD again today and get volume down, needs lower dry wt I believe as prob lost body wt w/ COVID illness in Sept. May be ready for dc today. 2. BP/ volume -  As above, is back up to admit wt, pt denies drinking extra, discussed importance of fluid restriction.  3. ESRD - usual HD MWF.  Seen on HD today. Pre weight on floor scale was 97kg and goal is 4L net, so far 1.5L removed and midodrine not needed with systolic of 1562   4. H/o COVID+ infection - Sept 2020. Airborne restrictions lifted, per ID does not need isolation.  5. DM2 - on oral agent at home, SSI here 6. BP - is on midodrine at home and here 7. HL 8. Anemia ckd - Hb down 7.6. Give darbe 100 w/ HD today.  Subjective:   Feels much better.  Denies f/c/n/v/ dyspnea. Ready to go home; he's excited.   Objective:   BP (!) 152/73   Pulse 81   Temp 98.3 F (36.8 C) (Oral)   Resp 18   Ht _0  (1.676 m)   Wt 97 kg Comment: stood to scale   SpO2 98%   BMI 34.52 kg/m   Intake/Output Summary (Last 24 hours) at 04/18/2019 0849 Last data filed at 04/17/2019 2000 Gross per 24 hour  Intake 500 ml  Output 5000 ml  Net -4500 ml   Weight change: -0.487 kg  Physical Exam:  seen in HD unit, off of precautions   no jvd  Chest clear today  Cor reg no RG  Abd soft obese ntnd  Ext no pitting edema   L arm AVF +b/t  NF, Ox 3  Imaging: No results found.  Labs: BMET Recent Labs  Lab 04/13/19 0503  04/13/19 1125  04/14/19 0346 04/14/19 0352 04/15/19 0506 04/15/19 0518 04/16/19 0503 04/17/19 0414 04/18/19 0449  NA 140    < > 136   < > 136 136 134* 134* 136 136 138  K 4.0   < > 5.2*   < > 5.1 5.3* 4.6 4.6 4.2 3.7 3.5  CL 99  --  101  --   --  98  --  98 98 100 97*  CO2 24  --  23  --   --  22  --  19* 22 21* 26  GLUCOSE 75  --  109*  --   --  182*  --  159* 122* 83 89  BUN 53*  --  55*  --   --  36*  --  69* 58* 85* 45*  CREATININE 12.02*  --  13.01*  --   --  9.51*  --  11.72* 9.55* 11.49* 8.12*  CALCIUM 8.0*  --  7.2*  --   --  7.3*  --  6.8* 6.9* 6.6* 7.0*  PHOS  --   --   --   --   --   --   --  6.1* 6.5* 5.8* 4.8*   < > = values in this interval  not displayed.   CBC Recent Labs  Lab 04/13/19 0503  04/14/19 0352  04/15/19 0518 04/16/19 0503 04/17/19 0414 04/18/19 0449  WBC 15.5*  --  13.4*  --  15.8* 11.0* 9.7 9.3  NEUTROABS 13.0*  --  11.3*  --   --   --   --   --   HGB 9.7*   < > 8.8*   < > 8.3* 7.6* 8.1* 8.9*  HCT 30.9*   < > 28.8*   < > 25.9* 24.2* 25.2* 27.1*  MCV 84.4  --  85.5  --  83.3 82.6 81.6 80.9  PLT 340  --  336  --  284 200 316 293   < > = values in this interval not displayed.    Medications:    . atorvastatin  20 mg Oral Daily  . calcium acetate  667 mg Oral TID WC  . chlorhexidine gluconate (MEDLINE KIT)  15 mL Mouth Rinse BID  . Chlorhexidine Gluconate Cloth  6 each Topical Q0600  . Chlorhexidine Gluconate Cloth  6 each Topical Q0600  . Chlorhexidine Gluconate Cloth  6 each Topical Q0600  . darbepoetin (ARANESP) injection - DIALYSIS  100 mcg Intravenous Q Fri-HD  . feeding supplement (NEPRO CARB STEADY)  237 mL Oral BID BM  . feeding supplement (PRO-STAT SUGAR FREE 64)  30 mL Oral BID WC  . fenofibrate  160 mg Oral Daily  . heparin      . heparin injection (subcutaneous)  10,000 Units Subcutaneous Q8H  . insulin aspart  0-9 Units Subcutaneous TID WC  . midodrine      . midodrine  10 mg Oral BID  . multivitamin  1 tablet Oral QHS      Otelia Santee, MD 04/18/2019, 8:49 AM

## 2019-04-19 ENCOUNTER — Telehealth: Payer: Self-pay | Admitting: Nephrology

## 2019-04-19 NOTE — Telephone Encounter (Signed)
Transition of care contact from inpatient facility  Date of Discharge: 04/18/2019 Date of Contact: 04/19/2019 Method of contact: phone Talked with: patinet  Patient contact to discuss transition of care from recent inpatient hospitalization. Pateint was admitted to [facility] from :10/19 - 04/18/2019 with d/c diagnosis of acute VDRF secondary to pulmonary edema from unintentional weight loss related to September COVID infection.  Last HD treatment was 10/24 with net UF 4 L post wt 92.8 - no cramping or BP drops. He resented well last night and breathing easily today and feels a little stronger.  D/c information faxed to home unit with follow up dialysis related changes.  Medication changes none  Patient will follow up at outpatient dialysis on Monday 27th.  Other follow up needs: None   Amalia Hailey, PA-C [facility] Pager:  [phone]

## 2020-03-08 IMAGING — DX DG CHEST 1V PORT
1 series · 1 of 1 positions shown · non-contrast
Comparison: Film from earlier in the same day.

CLINICAL DATA: Shortness of breath and cough for 2 days, status
post intubation

EXAM:
PORTABLE CHEST 1 VIEW

[chest ap]
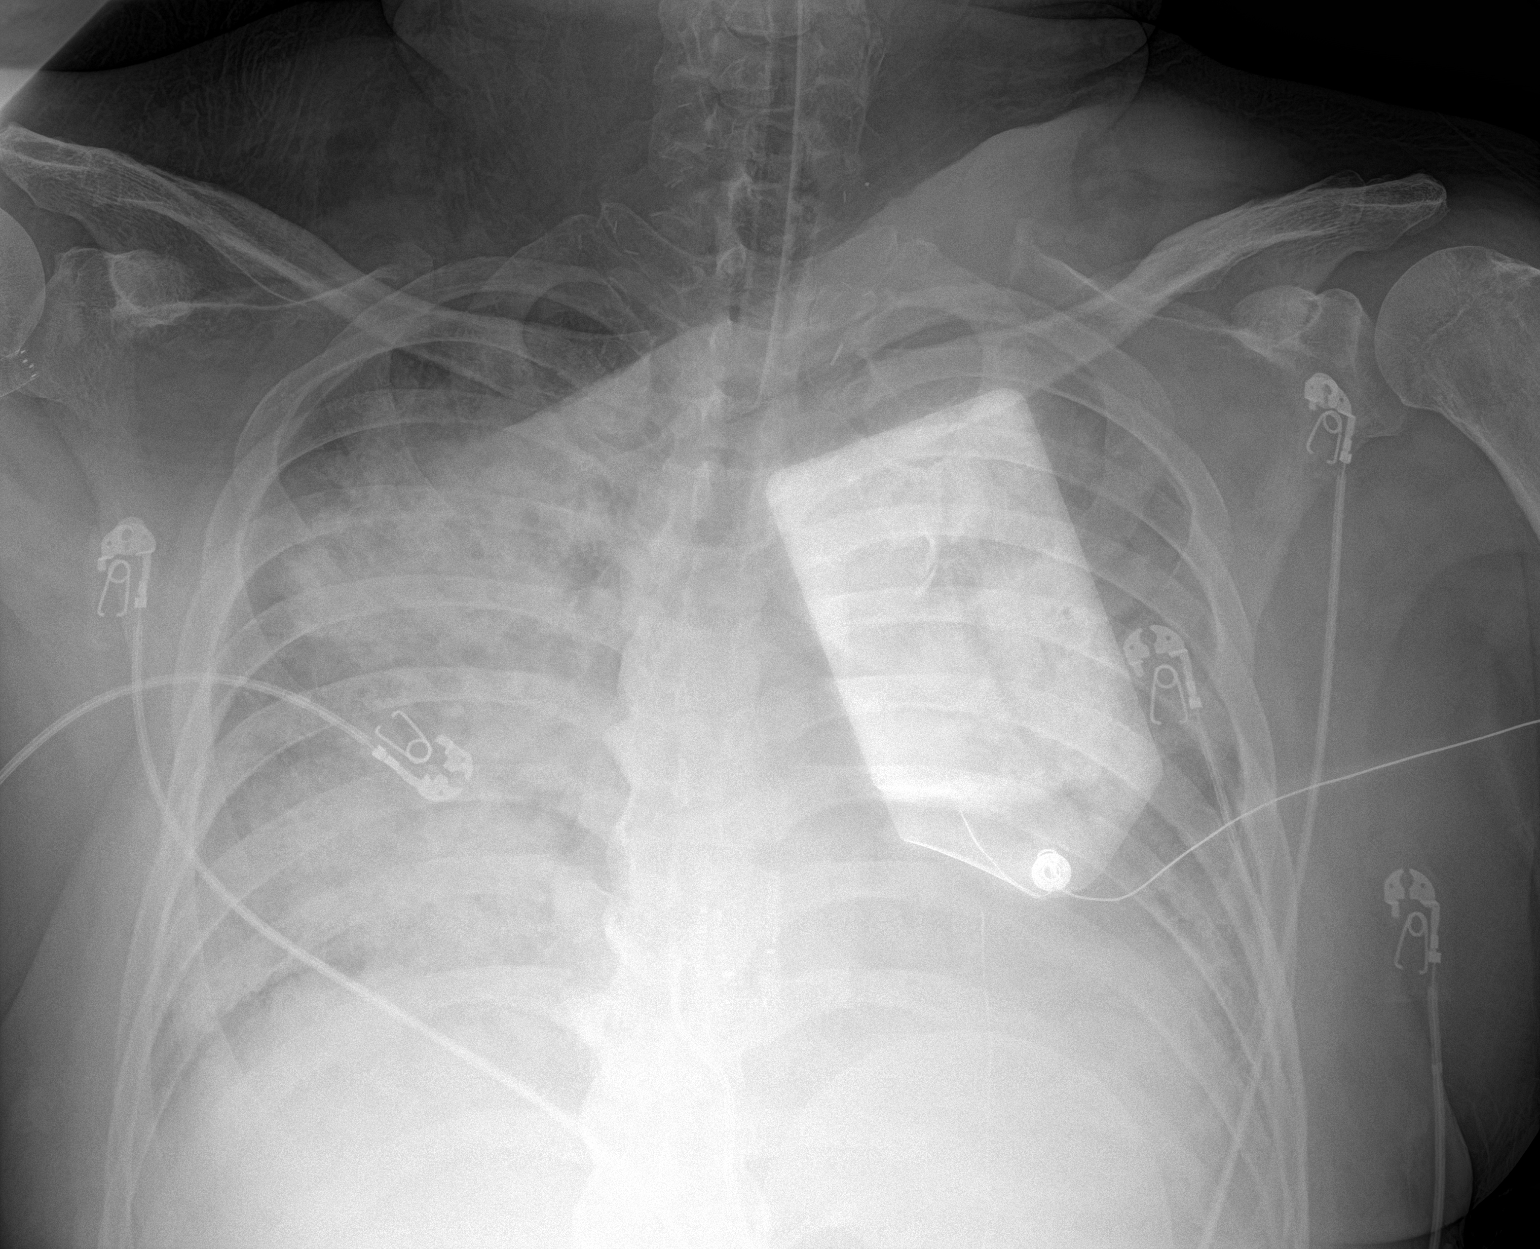

[1 of 1 positions shown; findings below may reference images not displayed]

FINDINGS: Endotracheal tube is now noted in satisfactory position. The overall
inspiratory effort is poor with crowding of the parenchymal
markings. Increasing bilateral airspace opacities are noted likely
accentuated by the poor inspiratory technique. No bony abnormality
is seen.
IMPRESSION: Persistent but worsening bilateral airspace opacities.

Endotracheal tube in satisfactory position.

## 2020-03-10 IMAGING — DX DG CHEST 1V PORT
1 series · 1 of 1 positions shown · non-contrast
Comparison: Yesterday

CLINICAL DATA: History of endotracheal tube

EXAM:
PORTABLE CHEST 1 VIEW

[chest ap]
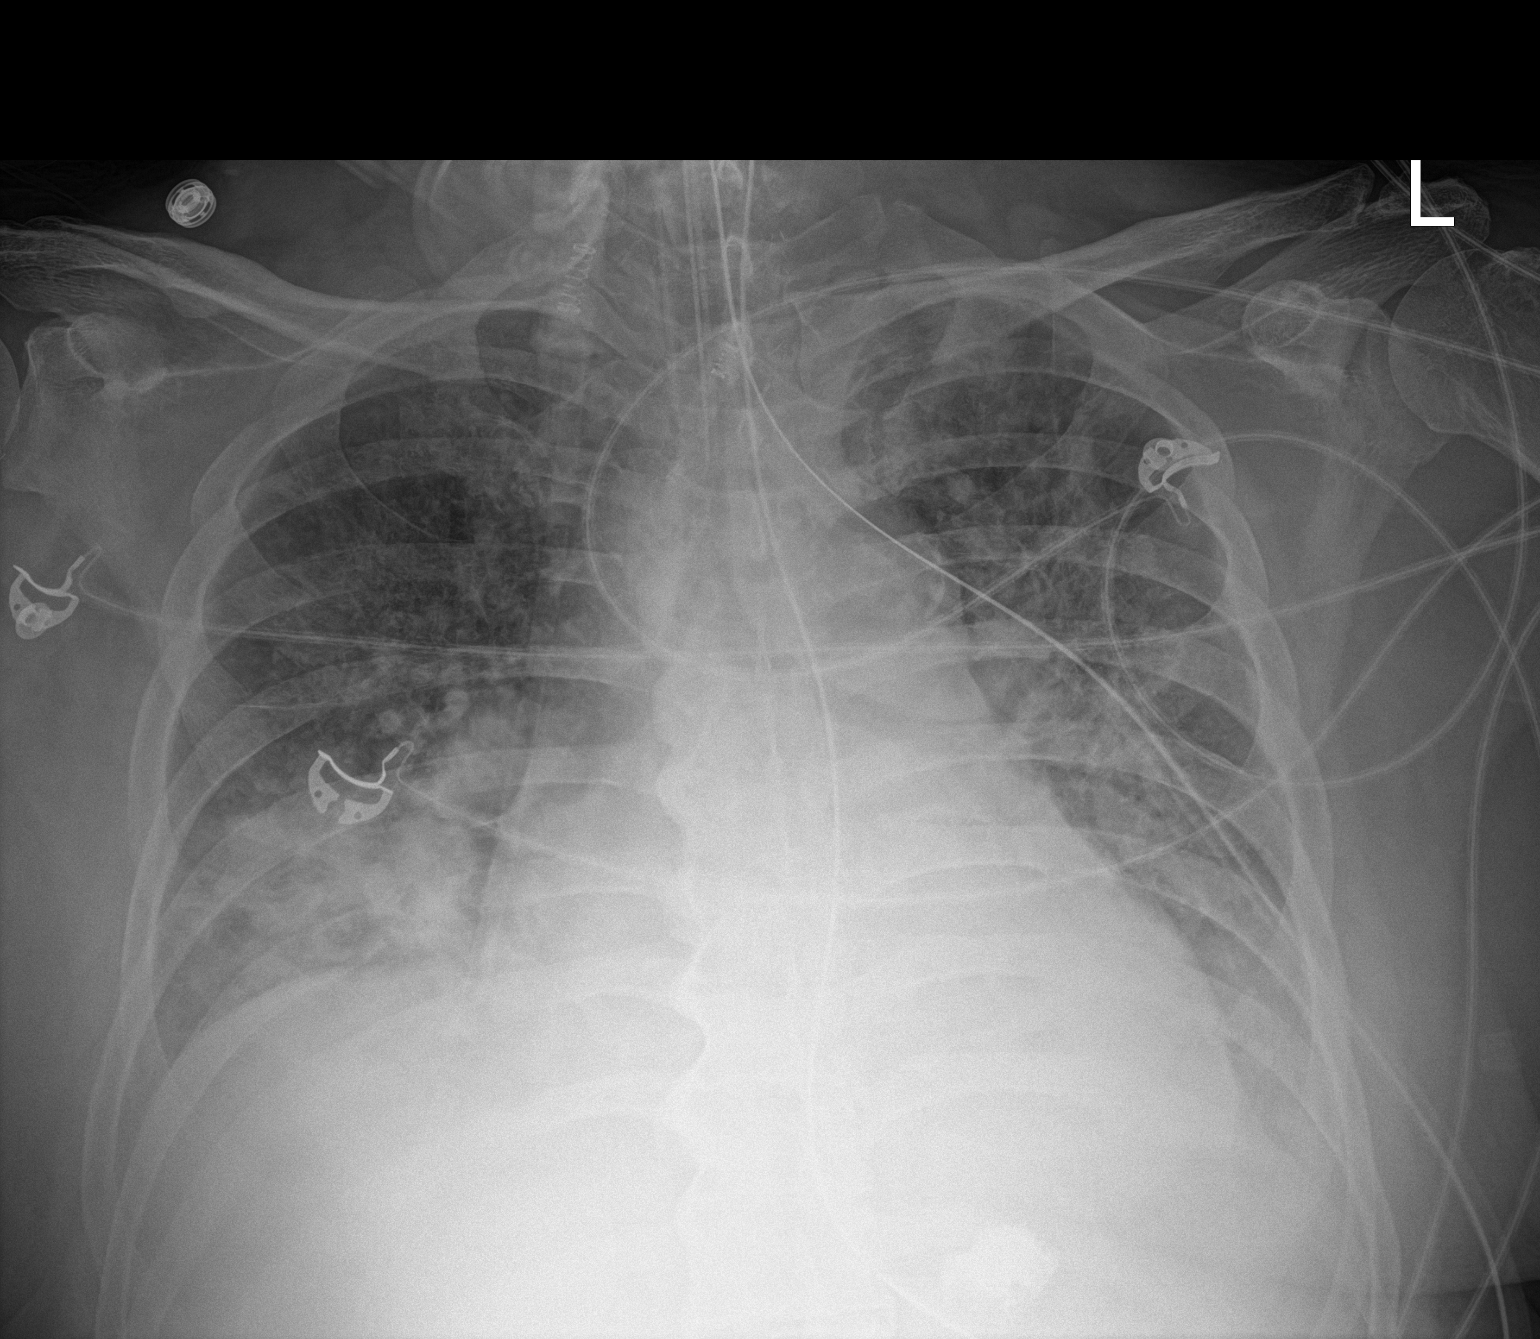

[1 of 1 positions shown; findings below may reference images not displayed]

FINDINGS: Endotracheal tube tip is just below the clavicular heads. The
orogastric tube reaches the stomach. Extensive bilateral airspace
disease with moderate improvement in the upper lungs. The could be
layering pleural fluid. No pneumothorax. Stable enlarged heart size.
IMPRESSION: 1. Improved but still extensive bilateral airspace disease.
2. Unremarkable hardware positioning.

## 2021-01-16 ENCOUNTER — Other Ambulatory Visit (HOSPITAL_COMMUNITY): Payer: Self-pay | Admitting: Nephrology

## 2021-01-16 DIAGNOSIS — Z01810 Encounter for preprocedural cardiovascular examination: Secondary | ICD-10-CM

## 2021-01-20 ENCOUNTER — Encounter (HOSPITAL_COMMUNITY): Payer: Self-pay | Admitting: Nephrology

## 2021-02-03 ENCOUNTER — Telehealth (HOSPITAL_COMMUNITY): Payer: Self-pay | Admitting: Nephrology

## 2021-02-03 NOTE — Telephone Encounter (Signed)
Just an FYI. We have made several attempts to contact this patient including sending a letter to schedule or reschedule their echocardiogram and stress echocardiogram. We will be removing the patient from the echo WQ.   MAILED LETTER  01/20/21 Called x3 and LB @ 10:41 LBW  01/18/21 called and LB @ 8:49/LBW  01/16/21 Called and LB @ 1:59/LBW     Thank you

## 2023-06-25 ENCOUNTER — Other Ambulatory Visit: Payer: Self-pay

## 2023-06-25 DIAGNOSIS — E041 Nontoxic single thyroid nodule: Secondary | ICD-10-CM

## 2023-06-27 ENCOUNTER — Other Ambulatory Visit: Payer: 59

## 2023-07-02 ENCOUNTER — Ambulatory Visit: Payer: 59 | Admitting: "Endocrinology

## 2023-07-09 ENCOUNTER — Ambulatory Visit (HOSPITAL_COMMUNITY)
Admission: RE | Admit: 2023-07-09 | Discharge: 2023-07-09 | Disposition: A | Discharge status: Home / Self Care | Payer: 59 | Attending: Nephrology | Admitting: Nephrology

## 2023-07-09 ENCOUNTER — Other Ambulatory Visit: Payer: Self-pay

## 2023-07-09 ENCOUNTER — Encounter (HOSPITAL_COMMUNITY)
Admission: RE | Disposition: A | Discharge status: Home / Self Care | Payer: Self-pay | Source: Home / Self Care | Attending: Nephrology

## 2023-07-09 DIAGNOSIS — Z7985 Long-term (current) use of injectable non-insulin antidiabetic drugs: Secondary | ICD-10-CM | POA: Insufficient documentation

## 2023-07-09 DIAGNOSIS — I120 Hypertensive chronic kidney disease with stage 5 chronic kidney disease or end stage renal disease: Secondary | ICD-10-CM | POA: Diagnosis not present

## 2023-07-09 DIAGNOSIS — D649 Anemia, unspecified: Secondary | ICD-10-CM | POA: Diagnosis not present

## 2023-07-09 DIAGNOSIS — N186 End stage renal disease: Secondary | ICD-10-CM | POA: Diagnosis present

## 2023-07-09 DIAGNOSIS — N250 Renal osteodystrophy: Secondary | ICD-10-CM | POA: Insufficient documentation

## 2023-07-09 DIAGNOSIS — Y832 Surgical operation with anastomosis, bypass or graft as the cause of abnormal reaction of the patient, or of later complication, without mention of misadventure at the time of the procedure: Secondary | ICD-10-CM | POA: Insufficient documentation

## 2023-07-09 DIAGNOSIS — E1122 Type 2 diabetes mellitus with diabetic chronic kidney disease: Secondary | ICD-10-CM | POA: Insufficient documentation

## 2023-07-09 DIAGNOSIS — T82858A Stenosis of vascular prosthetic devices, implants and grafts, initial encounter: Secondary | ICD-10-CM | POA: Insufficient documentation

## 2023-07-09 HISTORY — PX: PERIPHERAL VASCULAR BALLOON ANGIOPLASTY: CATH118281

## 2023-07-09 HISTORY — PX: A/V FISTULAGRAM: CATH118298

## 2023-07-09 SURGERY — A/V FISTULAGRAM
Anesthesia: LOCAL

## 2023-07-09 MED ORDER — LIDOCAINE HCL (PF) 1 % IJ SOLN
INTRAMUSCULAR | Status: DC | PRN
  Administered 2023-07-09: 2 mL via INTRADERMAL

## 2023-07-09 MED ORDER — SODIUM CHLORIDE 0.9 % IV SOLN: INTRAVENOUS | Status: DC

## 2023-07-09 MED ORDER — LIDOCAINE HCL (PF) 1 % IJ SOLN
INTRAMUSCULAR | Status: AC
  Filled 2023-07-09: qty 30

## 2023-07-09 MED ORDER — ACETAMINOPHEN 325 MG PO TABS
650.0000 mg | ORAL_TABLET | ORAL | Status: DC | PRN
Start: 2023-07-09 — End: 2023-07-09

## 2023-07-09 MED ORDER — SODIUM CHLORIDE 0.9% FLUSH: 3.0000 mL | INTRAVENOUS | Status: DC | PRN

## 2023-07-09 MED ORDER — MIDAZOLAM HCL 2 MG/2ML IJ SOLN
INTRAMUSCULAR | Status: DC | PRN
  Administered 2023-07-09: 1 mg via INTRAVENOUS

## 2023-07-09 MED ORDER — MIDAZOLAM HCL 2 MG/2ML IJ SOLN
INTRAMUSCULAR | Status: AC
  Filled 2023-07-09: qty 2

## 2023-07-09 MED ORDER — FENTANYL CITRATE (PF) 100 MCG/2ML IJ SOLN
INTRAMUSCULAR | Status: DC | PRN
  Administered 2023-07-09: 50 ug via INTRAVENOUS

## 2023-07-09 MED ORDER — FENTANYL CITRATE (PF) 100 MCG/2ML IJ SOLN
INTRAMUSCULAR | Status: AC
  Filled 2023-07-09: qty 2

## 2023-07-09 MED ORDER — HEPARIN (PORCINE) IN NACL 1000-0.9 UT/500ML-% IV SOLN
INTRAVENOUS | Status: DC | PRN
  Administered 2023-07-09: 500 mL

## 2023-07-09 MED ORDER — ONDANSETRON HCL 4 MG/2ML IJ SOLN: 4.0000 mg | Freq: Four times a day (QID) | INTRAMUSCULAR | Status: DC | PRN

## 2023-07-09 MED ORDER — IODIXANOL 320 MG/ML IV SOLN
INTRAVENOUS | Status: DC | PRN
  Administered 2023-07-09: 15 mL via INTRAVENOUS

## 2023-07-09 SURGICAL SUPPLY — 9 items
BAG SNAP BAND KOVER 36X36 (MISCELLANEOUS) ×2 IMPLANT
BALLN MUSTANG 5.0X40 75 (BALLOONS) ×2
BALLOON MUSTANG 5.0X40 75 (BALLOONS) IMPLANT
CATH ANGIO 5F BER2 65CM (CATHETERS) IMPLANT
COVER DOME SNAP 22 D (MISCELLANEOUS) ×2 IMPLANT
GUIDEWIRE ANGLED .035X150CM (WIRE) IMPLANT
SHEATH PINNACLE R/O II 6F 4CM (SHEATH) IMPLANT
SYR MEDALLION 10ML (SYRINGE) IMPLANT
TRAY PV CATH (CUSTOM PROCEDURE TRAY) ×2 IMPLANT

## 2023-07-09 NOTE — Op Note (Signed)
 Patient presents with decreased access flows in his left BCF with last flow 5 mm PTA on February 28, 2021.   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  Summary:  1)      The patient had successful angioplasty (5x4 Mustang FE ~10 atm) of significant stenosis in the arterial + juxta anastomosis.  2)      The body of the cephalic vein fistula with 2 large arterial limb aneurysms, body of the cephalic, arch were patent with much improved flows after inflow angioplasty.   The problem was the aneurysms are so large and it was difficult to manipulate the wire into a normal/stenotic calibered juxta segment even with the aid of a Bernstein catheter. SABRA  3)      The centrals were widely patent. 4)      This left BCF remains amenable to future percutaneous intervention.  Description of procedure: The arm was prepped and draped in the usual sterile fashion. The left upper arm brachial cephalic fistula was cannulated (63097) with an 18G Angiocath needle directed in a retrograde direction. A guidewire was inserted and exchanged for a 6Fr sheath. Contrast [phone]) injection via the side port of the sheath was performed. The angiogram of the fistula (63097) showed an aneurysmal area limb, body of the left BCF and cephalic vein arch are patent.  Great difficulty  the wire was advanced in the retrograde direction past the aneurysms, inflow anastomosis and finally into the proximal brachial artery .  Arteriogram was performed from the proximal brachial artery revealing a patent brachial artery, anastomosis with a 50-70% juxta anastomotic cephalic vein stenosis.  A  5x4 Mustang angioplasty balloon was inserted over a glide wire and positioned at the inflow cephalic vein + arterial anastomosis stenosis. Arterial + venous angioplasty (63097) was carried out to 10-12 ATM with FULL effacement of the waist on the  balloon.  Repeat angiogram showed 10% residual at the site of angioplasty with no evidence of extravasation.  The flow of contrast was quicker and the symptoms along the inflow limb were now filling much better .  Hemostasis: A 3-0 ethilon purse string suture was placed at the cannulation site on removal of the sheath.  Sedation: 1 mg Versed , 50 mcg Fentanyl . Sedation time. 20 minutes  Contrast.  15 mL  Monitoring: Because of the patient's comorbid conditions and sedation during the procedure, continuous EKG monitoring and O2 saturation monitoring was performed throughout the procedure by the RN. There were no abnormal arrhythmias encountered.  Complications: None.   Diagnoses: I87.1 Stricture of vein  N18.6 ESRD T82.858A Stricture of access  Procedure Coding:  [id number] Cannulation and angiogram of fistula, venous angioplasty (cephalic vein arch) V0032 Contrast  Recommendations:  1. Continue to cannulate the fistula with 15G needles.  2. Refer back for problems with flows. 3. Remove the suture next treatment.   Discharge: The patient was discharged home in stable condition. The patient was given education regarding the care of the dialysis access AVF and specific instructions in case of any problems.

## 2023-07-09 NOTE — Discharge Instructions (Signed)

## 2023-07-09 NOTE — H&P (Addendum)
 Chief Complaint: Decreased flows  Interval H&P  The patient has presented today for an angiogram/ angioplasty; patient is followed at [facility] with Dr. Dennise.  Various methods of treatment have been discussed with the patient.  After consideration of risk, benefits and other options for treatment, the patient has consented to a angiogram/ angioplasty with  possible stent placement.   Risks of angiogram with potential angioplasty and stenting if needed.contrast reaction, extravasation/ bleeding, dissection, hypotension and death were explained to the patient.  The patient's history has been reviewed and the patient has been examined, no changes in status.  Stable for angiogram/angioplasty  I have reviewed the patient's chart and labs.  Questions were answered to the patient's satisfaction.  Outpt HD: MWF AF  4h  99kg  2K/3.5Ca bath   L AVF   Heparin  10000  - mircera 150 ug every 2 wks  Assessment/Plan: ESRD dialyzing at AF MWF regimen with last dialysis Monday (signed off 15 minutes early because of a appointment elsewhere) Decreased access flows - planning on angiogram with possibly angioplasty; patient has a left upper arm brachiocephalic fistula with last inflow arterial anastomosis 5 mm angioplasty on February 28, 2021. Renal osteodystrophy - continue binders per home regimen. Anemia - managed with ESA's and IV iron at dialysis center. HTN - resume home regimen. DM   HPI: James Brown is an 58 y.o. male with a history of hypertension, diabetes, hyperlipidemia, abdominal aortic aneurysm with a left brachiocephalic fistula placed by Dr. Eliza March 11, 2015.  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 Eletha.  Patient being referred for decreasing access flows and poor clearances; patient has a left brachiocephalic fistula placed by Dr. Eliza in 2016 with last inflow angioplasty with a 5 mm balloon on February 28, 2021.  Pt denies fever, chills, nausea, vomiting, myalgias, SOB, CP.   ROS Per HPI.  Chemistry and CBC: Creatinine, Ser  Date/Time Value Ref Range Status  04/18/2019 04:49 AM 8.12 (H) 0.61 - 1.24 mg/dL Final  89/76/7979 95:85 AM 11.49 (H) 0.61 - 1.24 mg/dL Final  89/77/7979 94:96 AM 9.55 (H) 0.61 - 1.24 mg/dL Final  89/78/7979 94:81 AM 11.72 (H) 0.61 - 1.24 mg/dL Final  89/79/7979 96:47 AM 9.51 (H) 0.61 - 1.24 mg/dL Final  89/80/7979 88:74 AM 13.01 (H) 0.61 - 1.24 mg/dL Final  89/80/7979 94:96 AM 12.02 (H) 0.61 - 1.24 mg/dL Final  90/81/7979 92:77 AM 11.67 (H) 0.61 - 1.24 mg/dL Final  90/82/7979 94:81 AM 12.88 (H) 0.61 - 1.24 mg/dL Final  90/83/7979 94:85 AM 17.75 (H) 0.61 - 1.24 mg/dL Final  90/84/7979 95:73 AM 15.15 (H) 0.61 - 1.24 mg/dL Final  90/85/7979 94:54 AM 18.73 (H) 0.61 - 1.24 mg/dL Final  90/86/7979 96:46 PM 17.67 (H) 0.61 - 1.24 mg/dL Final  89/98/7980 90:94 AM 12.96 (H) 0.61 - 1.24 mg/dL Final  87/70/7983 97:85 PM 8.28 (H) 0.61 - 1.24 mg/dL Final  94/97/7985 91:69 AM 12.26 (H) 0.50 - 1.35 mg/dL Final  94/98/7985 95:45 PM 10.38 (H) 0.50 - 1.35 mg/dL Final  94/98/7985 93:65 AM 9.28 (H) 0.50 - 1.35 mg/dL Final  95/69/7985 96:77 PM 6.38 (H) 0.50 - 1.35 mg/dL Final    Comment:    DELTA CHECK NOTED  10/22/2012 06:57 AM 11.68 (H) 0.50 - 1.35 mg/dL Final  95/70/7985 94:96 PM 10.22 (H) 0.50 - 1.35 mg/dL Final  95/70/7985 89:47 AM 9.64 (H) 0.50 - 1.35 mg/dL Final  88/75/7990 90:50 AM 9.9 (H)  Final  No results for input(s): NA, K, CL, CO2, GLUCOSE, BUN, CREATININE, CALCIUM , PHOS in the last 168 hours.  Invalid input(s): ALB No results for input(s): WBC, NEUTROABS, HGB, HCT, MCV, PLT in the last 168 hours. Liver Function Tests: No results for input(s): AST, ALT, ALKPHOS, BILITOT, PROT, ALBUMIN  in the last 168 hours. No results for input(s): LIPASE, AMYLASE in the last 168 hours. No results for input(s): AMMONIA in the  last 168 hours. Cardiac Enzymes: No results for input(s): CKTOTAL, CKMB, CKMBINDEX, TROPONINI in the last 168 hours. Iron Studies: No results for input(s): IRON, TIBC, TRANSFERRIN, FERRITIN in the last 72 hours. PT/INR: @LABRCNTIP (inr:5)  Xrays/Other Studies: )No results found for this or any previous visit (from the past 48 hours). No results found.  PMH:   Past Medical History:  Diagnosis Date   AAA (abdominal aortic aneurysm) (HCC)    Constipation    Dermatitis    Diabetes (HCC)    boderline   ESRD (end stage renal disease) on dialysis (HCC)    started 09/2006 MWF [facility],   [facility]   Hyperlipidemia    Hypertension    Tinea versicolor     PSH:   Past Surgical History:  Procedure Laterality Date   AV FISTULA PLACEMENT  06/2005   Right Arm   AV FISTULA PLACEMENT Right 09/04/2013   Procedure: REVISION OF RIGHT BRACHIOCEPHALIC FISTULA WITH INSERTION OF ARTERIOVENOUS (AV) GORE-TEX GRAFT ARM;  Surgeon: Lynwood JONETTA Collum, MD;  Location: [facility] OR;  Service: Vascular;  Laterality: Right;   AV FISTULA PLACEMENT Left 06/10/2015   Procedure: ARTERIOVENOUS (AV) FISTULA CREATION LEFT UPPER ARM;  Surgeon: Lonni GORMAN Blade, MD;  Location: [facility] OR;  Service: Vascular;  Laterality: Left;   COLONOSCOPY WITH PROPOFOL  N/A 03/25/2018   Procedure: COLONOSCOPY WITH PROPOFOL ;  Surgeon: Gaylyn Gladis PENNER, MD;  Location: [facility] ENDOSCOPY;  Service: Endoscopy;  Laterality: N/A;   PARATHYROIDECTOMY     PARATHYROIDECTOMY Left 10/21/2012   Procedure: TOTAL PARATHYROIDECTOMY WITH AUTOTRANSPLANT TO THE LEFT FOREARM;  Surgeon: Krystal CHRISTELLA Spinner, MD;  Location: [facility] OR;  Service: General;  Laterality: Left;    Allergies:  Allergies  Allergen Reactions   Aplisol [Tuberculin, Ppd] Swelling    Medications:   Prior to Admission medications   Medication Sig Start Date End Date Taking? Authorizing Provider  Calcium  Acetate 667 MG TABS Take 2,001 mg by mouth 3 (three) times daily with meals.   Yes  [provider]  fenofibrate  160 MG tablet Take 160 mg by mouth daily.   Yes [provider]  midodrine  (PROAMATINE ) 10 MG tablet Take 10 mg by mouth daily as needed (high blood pressure).   Yes [provider]  multivitamin (RENA-VIT) TABS tablet Take 1 tablet by mouth at bedtime. 04/18/19  Yes Alekh, Kshitiz, MD  OZEMPIC, 1 MG/DOSE, 4 MG/3ML SOPN Inject 1 mg into the skin once a week.   Yes [provider]  XPHOZAH 30 MG TABS Take 30 mg by mouth 2 (two) times daily. 05/07/23  Yes [provider]    Discontinued Meds:   Medications Discontinued During This Encounter  Medication Reason   B Complex-C-Folic Acid  (RENA-VITE PO) Patient Preference   benzonatate (TESSALON) 200 MG capsule Patient Preference   calcium  carbonate (TUMS) 500 MG chewable tablet Patient Preference   atorvastatin  (LIPITOR) 20 MG tablet Patient Preference    Social History:  reports that he has never smoked. He has never used smokeless tobacco. He reports that he does not drink alcohol and does  not use drugs.  Family History:   Family History  Adopted: Yes    Blood pressure 99/68, pulse 89, resp. rate 14, height 5' 7 (1.702 m), weight 90.7 kg, SpO2 98%. GEN: NAD, A&Ox3, NCAT HEENT: No conjunctival pallor, EOMI NECK: Supple, no thyromegaly LUNGS: CTA B/L no rales, rhonchi or wheezing CV: RRR, No M/R/G ABD: SNDNT +BS, obese EXT: No lower extremity edema ACCESS: Brachiocephalic fistula aneurysmal inflow, hyper pulsatile at the inflow anastomosis and the aneurysms are not pulsatile        Aithan Farrelly, LYNWOOD ORN, MD 07/09/2023, 7:24 AM

## 2023-07-10 ENCOUNTER — Encounter (HOSPITAL_COMMUNITY): Payer: Self-pay | Admitting: Nephrology

## 2023-07-16 ENCOUNTER — Ambulatory Visit: Payer: 59 | Admitting: Anesthesiology

## 2023-07-16 ENCOUNTER — Telehealth: Payer: Self-pay

## 2023-07-16 ENCOUNTER — Encounter: Payer: Self-pay | Admitting: Gastroenterology

## 2023-07-16 ENCOUNTER — Encounter
Admission: RE | Disposition: A | Discharge status: Home / Self Care | Payer: Self-pay | Source: Home / Self Care | Attending: Gastroenterology

## 2023-07-16 ENCOUNTER — Ambulatory Visit
Admission: RE | Admit: 2023-07-16 | Discharge: 2023-07-16 | Disposition: A | Discharge status: Home / Self Care | Payer: 59 | Attending: Gastroenterology | Admitting: Gastroenterology

## 2023-07-16 DIAGNOSIS — Z1211 Encounter for screening for malignant neoplasm of colon: Secondary | ICD-10-CM | POA: Insufficient documentation

## 2023-07-16 DIAGNOSIS — E1122 Type 2 diabetes mellitus with diabetic chronic kidney disease: Secondary | ICD-10-CM | POA: Insufficient documentation

## 2023-07-16 DIAGNOSIS — D124 Benign neoplasm of descending colon: Secondary | ICD-10-CM | POA: Diagnosis not present

## 2023-07-16 DIAGNOSIS — D123 Benign neoplasm of transverse colon: Secondary | ICD-10-CM | POA: Insufficient documentation

## 2023-07-16 DIAGNOSIS — I120 Hypertensive chronic kidney disease with stage 5 chronic kidney disease or end stage renal disease: Secondary | ICD-10-CM | POA: Diagnosis not present

## 2023-07-16 DIAGNOSIS — N186 End stage renal disease: Secondary | ICD-10-CM | POA: Insufficient documentation

## 2023-07-16 DIAGNOSIS — K635 Polyp of colon: Secondary | ICD-10-CM | POA: Diagnosis not present

## 2023-07-16 DIAGNOSIS — G4730 Sleep apnea, unspecified: Secondary | ICD-10-CM | POA: Diagnosis not present

## 2023-07-16 DIAGNOSIS — Z860100 Personal history of colon polyps, unspecified: Secondary | ICD-10-CM

## 2023-07-16 DIAGNOSIS — Z992 Dependence on renal dialysis: Secondary | ICD-10-CM | POA: Insufficient documentation

## 2023-07-16 DIAGNOSIS — Z79899 Other long term (current) drug therapy: Secondary | ICD-10-CM | POA: Diagnosis not present

## 2023-07-16 DIAGNOSIS — K641 Second degree hemorrhoids: Secondary | ICD-10-CM | POA: Insufficient documentation

## 2023-07-16 DIAGNOSIS — Z7985 Long-term (current) use of injectable non-insulin antidiabetic drugs: Secondary | ICD-10-CM | POA: Diagnosis not present

## 2023-07-16 DIAGNOSIS — Z860101 Personal history of adenomatous and serrated colon polyps: Secondary | ICD-10-CM | POA: Diagnosis present

## 2023-07-16 HISTORY — PX: COLONOSCOPY WITH PROPOFOL: SHX5780

## 2023-07-16 HISTORY — PX: BIOPSY: SHX5522

## 2023-07-16 SURGERY — COLONOSCOPY WITH PROPOFOL
Anesthesia: General

## 2023-07-16 MED ORDER — PROPOFOL 10 MG/ML IV BOLUS
INTRAVENOUS | Status: DC | PRN
  Administered 2023-07-16: 10 mg via INTRAVENOUS
  Administered 2023-07-16: 90 mg via INTRAVENOUS

## 2023-07-16 MED ORDER — PHENYLEPHRINE HCL (PRESSORS) 10 MG/ML IV SOLN
INTRAVENOUS | Status: DC | PRN
  Administered 2023-07-16 (×2): 80 ug via INTRAVENOUS

## 2023-07-16 MED ORDER — PHENYLEPHRINE 80 MCG/ML (10ML) SYRINGE FOR IV PUSH (FOR BLOOD PRESSURE SUPPORT)
PREFILLED_SYRINGE | INTRAVENOUS | Status: AC
  Filled 2023-07-16: qty 10

## 2023-07-16 MED ORDER — LIDOCAINE 2% (20 MG/ML) 5 ML SYRINGE
INTRAMUSCULAR | Status: DC | PRN
  Administered 2023-07-16: 100 mg via INTRAVENOUS

## 2023-07-16 MED ORDER — LIDOCAINE HCL (PF) 2 % IJ SOLN
INTRAMUSCULAR | Status: AC
  Filled 2023-07-16: qty 5

## 2023-07-16 MED ORDER — PROPOFOL 500 MG/50ML IV EMUL
INTRAVENOUS | Status: DC | PRN
  Administered 2023-07-16: 150 ug/kg/min via INTRAVENOUS

## 2023-07-16 MED ORDER — PROPOFOL 1000 MG/100ML IV EMUL
INTRAVENOUS | Status: AC
  Filled 2023-07-16: qty 100

## 2023-07-16 MED ORDER — SODIUM CHLORIDE 0.9 % IV SOLN: INTRAVENOUS | Status: DC

## 2023-07-16 NOTE — H&P (Signed)
Midge Minium, MD [facility] [address], [address] [place], [place] [address] Phone:[phone] Fax : [phone]  Primary Care Physician:  Marisue Ivan, MD Primary Gastroenterologist:  Dr. Servando Snare  Pre-Procedure History & Physical: HPI:  James Brown is a 58 y.o. male is here for an colonoscopy.   Past Medical History:  Diagnosis Date   AAA (abdominal aortic aneurysm) (HCC)    Constipation    Dermatitis    Diabetes (HCC)    boderline   ESRD (end stage renal disease) on dialysis (HCC)    started 09/2006 MWF [facility],   [facility]   Hyperlipidemia    Hypertension    Tinea versicolor     Past Surgical History:  Procedure Laterality Date   A/V FISTULAGRAM N/A 07/09/2023   Procedure: A/V Fistulagram;  Surgeon: Ethelene Hal, MD;  Location: [facility] INVASIVE CV LAB;  Service: Cardiovascular;  Laterality: N/A;   AV FISTULA PLACEMENT  06/25/2005   Right Arm   AV FISTULA PLACEMENT Right 09/04/2013   Procedure: REVISION OF RIGHT BRACHIOCEPHALIC FISTULA WITH INSERTION OF ARTERIOVENOUS (AV) GORE-TEX GRAFT ARM;  Surgeon: Pryor Ochoa, MD;  Location: [facility] OR;  Service: Vascular;  Laterality: Right;   AV FISTULA PLACEMENT Left 06/10/2015   Procedure: ARTERIOVENOUS (AV) FISTULA CREATION LEFT UPPER ARM;  Surgeon: Chuck Hint, MD;  Location: [facility] OR;  Service: Vascular;  Laterality: Left;   COLONOSCOPY     COLONOSCOPY WITH PROPOFOL N/A 03/25/2018   Procedure: COLONOSCOPY WITH PROPOFOL;  Surgeon: Christena Deem, MD;  Location: [facility] ENDOSCOPY;  Service: Endoscopy;  Laterality: N/A;   PARATHYROIDECTOMY     PARATHYROIDECTOMY Left 10/21/2012   Procedure: TOTAL PARATHYROIDECTOMY WITH AUTOTRANSPLANT TO THE LEFT FOREARM;  Surgeon: Velora Heckler, MD;  Location: [facility] OR;  Service: General;  Laterality: Left;   PERIPHERAL VASCULAR BALLOON ANGIOPLASTY Left 07/09/2023   Procedure: PERIPHERAL VASCULAR BALLOON ANGIOPLASTY;  Surgeon: Ethelene Hal, MD;  Location: [facility] INVASIVE CV LAB;  Service:  Cardiovascular;  Laterality: Left;  AA    Prior to Admission medications   Medication Sig Start Date End Date Taking? Authorizing Provider  Calcium Acetate 667 MG TABS Take 2,001 mg by mouth 3 (three) times daily with meals.   Yes [provider]  fenofibrate 160 MG tablet Take 160 mg by mouth daily.   Yes [provider]  midodrine (PROAMATINE) 10 MG tablet Take 10 mg by mouth daily as needed (high blood pressure).   Yes [provider]  multivitamin (RENA-VIT) TABS tablet Take 1 tablet by mouth at bedtime. 04/18/19  Yes Glade Lloyd, MD  XPHOZAH 30 MG TABS Take 30 mg by mouth 2 (two) times daily. 05/07/23  Yes [provider]  OZEMPIC, 1 MG/DOSE, 4 MG/3ML SOPN Inject 1 mg into the skin once a week.    [provider]    Allergies as of 07/08/2023 - Review Complete 07/08/2023  Allergen Reaction Noted   Aplisol [tuberculin, ppd] Swelling 03/08/2019    Family History  Adopted: Yes    Social History   Socioeconomic History   Marital status: Married    Spouse name: Not on file   Number of children: Not on file   Years of education: Not on file   Highest education level: Not on file  Occupational History   Not on file  Tobacco Use   Smoking status: Never   Smokeless tobacco: Never  Substance and Sexual Activity   Alcohol use: No    Alcohol/week: 0.0 standard drinks of  alcohol   Drug use: No   Sexual activity: Not on file  Other Topics Concern   Not on file  Social History Narrative   Not on file   Social Drivers of Health   Financial Resource Strain: Low Risk  (07/08/2023)   Received from [facility]   Overall Financial Resource Strain (CARDIA)    Difficulty of Paying Living Expenses: Not hard at all  Food Insecurity: No Food Insecurity (07/08/2023)   Received from [facility]   Hunger Vital Sign    Worried About Running Out of Food in the Last Year: Never true    Ran Out of Food in the  Last Year: Never true  Transportation Needs: No Transportation Needs (07/08/2023)   Received from [facility] - Transportation    In the past 12 months, has lack of transportation kept you from medical appointments or from getting medications?: No    Lack of Transportation (Non-Medical): No  Physical Activity: Insufficiently Active (09/08/2019)   Received from [facility], [facility]   Exercise Vital Sign    Days of Exercise per Week: 1 day    Minutes of Exercise per Session: 10 min  Stress: No Stress Concern Present (09/08/2019)   Received from [facility], [facility]   [employer] of Occupational Health - Occupational Stress Questionnaire    Feeling of Stress : Only a little  Social Connections: Moderately Isolated (09/08/2019)   Received from [facility], [facility]   Social Connection and Isolation Panel [NHANES]    Frequency of Communication with Friends and Family: Twice a week    Frequency of Social Gatherings with Friends and Family: Once a week    Attends Religious Services: Never    Database administrator or Organizations: No    Attends Engineer, structural: Never    Marital Status: Married  Catering manager Violence: Not on file    Review of Systems: See HPI, otherwise negative ROS  Physical Exam: BP 101/70   Pulse 89   Temp (!) 97.4 F (36.3 C) (Temporal)   Resp 16   Wt 92.2 kg   SpO2 98%   BMI 31.83 kg/m  General:   Alert,  pleasant and cooperative in NAD Head:  Normocephalic and atraumatic. Neck:  Supple; no masses or thyromegaly. Lungs:  Clear throughout to auscultation.    Heart:  Regular rate and rhythm. Abdomen:  Soft, nontender and nondistended. Normal bowel sounds, without guarding, and without rebound.   Neurologic:  Alert and  oriented x4;  grossly normal neurologically.  Impression/Plan: Azar  Stansbery is here for an colonoscopy to be performed for a history of adenomatous polyps on 2019   Risks, benefits, limitations, and alternatives regarding  colonoscopy have been reviewed with the patient.  Questions have been answered.  All parties agreeable.   Midge Minium, MD  07/16/2023, 4:01 PM

## 2023-07-16 NOTE — Transfer of Care (Signed)
Immediate Anesthesia Transfer of Care Note  Patient: James Brown  Procedure(s) Performed: COLONOSCOPY WITH PROPOFOL BIOPSY  Patient Location: Endoscopy Unit  Anesthesia Type:General  Level of Consciousness: awake, alert , and oriented  Airway & Oxygen Therapy: Patient Spontanous Breathing  Post-op Assessment: Report given to RN and Post -op Vital signs reviewed and stable  Post vital signs: Reviewed and stable  Last Vitals:  Vitals Value Taken Time  BP 73/39 07/16/23 1626  Temp 36.4 C 07/16/23 1622  Pulse 70 07/16/23 1631  Resp 8 07/16/23 1631  SpO2 99 % 07/16/23 1631  Vitals shown include unfiled device data.  Last Pain:  Vitals:   07/16/23 1622  TempSrc: Temporal  PainSc: 0-No pain         Complications: No notable events documented.

## 2023-07-16 NOTE — Telephone Encounter (Signed)
Prior Authorization/Notification is not required for the requested service(s).  You are not required to submit a notification/prior authorization based on the information provided. If you have general questions about the prior authorization requirements, visit [email or web address] > Clinician Resources > Advance and Admission Notification Requirements. The number above acknowledges your notification. Please write this reference number down for future reference. If you would like to request an organization determination, please call us at [phone].  Decision ID #: [id number]

## 2023-07-16 NOTE — Op Note (Signed)
[facility] Patient Name: James Brown Procedure Date: 07/16/2023 1:40 PM MRN: [id number] Account #: [id number] Date of Birth: Oct 21, 1965 Admit Type: Outpatient Age: 58 Room: [facility] ENDO ROOM 1 Gender: Male Note Status: Finalized Instrument Name: Prentice Docker [id number] Procedure:             Colonoscopy Indications:           High risk colon cancer surveillance: Personal history                         of colonic polyps Providers:             Midge Minium MD, MD Referring MD:          Marisue Ivan (Referring MD) Medicines:             Propofol per Anesthesia Complications:         No immediate complications. Procedure:             Pre-Anesthesia Assessment:                        - Prior to the procedure, a History and Physical was                         performed, and patient medications and allergies were                         reviewed. The patient's tolerance of previous                         anesthesia was also reviewed. The risks and benefits                         of the procedure and the sedation options and risks                         were discussed with the patient. All questions were                         answered, and informed consent was obtained. Prior                         Anticoagulants: The patient has taken no anticoagulant                         or antiplatelet agents. ASA Grade Assessment: II - A                         patient with mild systemic disease. After reviewing                         the risks and benefits, the patient was deemed in                         satisfactory condition to undergo the procedure.                        After obtaining informed consent, the colonoscope was  passed under direct vision. Throughout the procedure,                         the patient's blood pressure, pulse, and oxygen                         saturations were monitored continuously. The                          Colonoscope was introduced through the anus and                         advanced to the the cecum, identified by appendiceal                         orifice and ileocecal valve. The colonoscopy was                         performed without difficulty. The patient tolerated                         the procedure well. The quality of the bowel                         preparation was excellent. Findings:      The perianal and digital rectal examinations were normal.      A 2 mm polyp was found in the cecum. The polyp was sessile. The polyp       was removed with a cold biopsy forceps. Resection and retrieval were       complete.      A 2 mm polyp was found in the transverse colon. The polyp was sessile.       The polyp was removed with a cold biopsy forceps. Resection and       retrieval were complete.      A 5 mm polyp was found in the descending colon. The polyp was sessile.       The polyp was removed with a cold snare. Resection and retrieval were       complete.      Non-bleeding internal hemorrhoids were found during retroflexion. The       hemorrhoids were Grade II (internal hemorrhoids that prolapse but reduce       spontaneously). Impression:            - One 2 mm polyp in the cecum, removed with a cold                         biopsy forceps. Resected and retrieved.                        - One 2 mm polyp in the transverse colon, removed with                         a cold biopsy forceps. Resected and retrieved.                        - One 5 mm polyp in the descending colon, removed with  a cold snare. Resected and retrieved.                        - Non-bleeding internal hemorrhoids. Recommendation:        - Discharge patient to home.                        - Resume previous diet.                        - Continue present medications.                        - Await pathology results.                        - If the pathology report  reveals adenomatous tissue,                         then repeat the colonoscopy for surveillance in 5                         years. Procedure Code(s):     --- Professional ---                        [phone], Colonoscopy, flexible; with removal of                         tumor(s), polyp(s), or other lesion(s) by snare                         technique                        45380, 59, Colonoscopy, flexible; with biopsy, single                         or multiple Diagnosis Code(s):     --- Professional ---                        Z86.010, Personal history of colonic polyps                        D12.4, Benign neoplasm of descending colon CPT copyright 2022 American Medical Association. All rights reserved. The codes documented in this report are preliminary and upon coder review may  be revised to meet current compliance requirements. Midge Minium MD, MD 07/16/2023 4:23:23 PM This report has been signed electronically. Number of Addenda: 0 Note Initiated On: 07/16/2023 1:40 PM Scope Withdrawal Time: 0 hours 9 minutes 3 seconds  Total Procedure Duration: 0 hours 12 minutes 41 seconds  Estimated Blood Loss:  Estimated blood loss: none.      [facility]

## 2023-07-16 NOTE — Anesthesia Preprocedure Evaluation (Addendum)
Anesthesia Evaluation  Patient identified by MRN, date of birth, ID band Patient awake    Reviewed: Allergy & Precautions, NPO status , Patient's Chart, lab work & pertinent test results  History of Anesthesia Complications Negative for: history of anesthetic complications  Airway Mallampati: II  TM Distance: >3 FB Neck ROM: full    Dental no notable dental hx.    Pulmonary sleep apnea and Continuous Positive Airway Pressure Ventilation    Pulmonary exam normal        Cardiovascular hypertension, On Medications Normal cardiovascular exam     Neuro/Psych negative neurological ROS  negative psych ROS   GI/Hepatic negative GI ROS, Neg liver ROS,,,  Endo/Other  diabetes    Renal/GU ESRF and DialysisRenal disease  negative genitourinary   Musculoskeletal   Abdominal   Peds  Hematology negative hematology ROS (+)   Anesthesia Other Findings Past Medical History: No date: AAA (abdominal aortic aneurysm) (HCC) No date: Constipation No date: Dermatitis No date: Diabetes (HCC)     Comment:  boderline No date: ESRD (end stage renal disease) on dialysis (HCC)     Comment:  started 09/2006 MWF [facility],   [facility] No date: Hyperlipidemia No date: Hypertension No date: Tinea versicolor  Past Surgical History: 07/09/2023: A/V FISTULAGRAM; N/A     Comment:  Procedure: A/V Fistulagram;  Surgeon: Ethelene Hal, MD;               Location: [facility] INVASIVE CV LAB;  Service: Cardiovascular;                Laterality: N/A; 06/25/2005: AV FISTULA PLACEMENT     Comment:  Right Arm 09/04/2013: AV FISTULA PLACEMENT; Right     Comment:  Procedure: REVISION OF RIGHT BRACHIOCEPHALIC FISTULA               WITH INSERTION OF ARTERIOVENOUS (AV) GORE-TEX GRAFT ARM;               Surgeon: Pryor Ochoa, MD;  Location: [facility] OR;  Service:               Vascular;  Laterality: Right; 06/10/2015: AV FISTULA PLACEMENT; Left     Comment:  Procedure:  ARTERIOVENOUS (AV) FISTULA CREATION LEFT               UPPER ARM;  Surgeon: Chuck Hint, MD;                Location: [facility] OR;  Service: Vascular;  Laterality: Left; No date: COLONOSCOPY 03/25/2018: COLONOSCOPY WITH PROPOFOL; N/A     Comment:  Procedure: COLONOSCOPY WITH PROPOFOL;  Surgeon:               Christena Deem, MD;  Location: [facility] ENDOSCOPY;                Service: Endoscopy;  Laterality: N/A; No date: PARATHYROIDECTOMY 10/21/2012: PARATHYROIDECTOMY; Left     Comment:  Procedure: TOTAL PARATHYROIDECTOMY WITH AUTOTRANSPLANT               TO THE LEFT FOREARM;  Surgeon: Velora Heckler, MD;                Location: [facility] OR;  Service: General;  Laterality: Left; 07/09/2023: PERIPHERAL VASCULAR BALLOON ANGIOPLASTY; Left     Comment:  Procedure: PERIPHERAL VASCULAR BALLOON ANGIOPLASTY;                Surgeon: Ethelene Hal, MD;  Location: [facility] INVASIVE  CV LAB;              Service: Cardiovascular;  Laterality: Left;  AA  BMI    Body Mass Index: 31.83 kg/m      Reproductive/Obstetrics negative OB ROS                             Anesthesia Physical Anesthesia Plan  ASA: 4  Anesthesia Plan: General   Post-op Pain Management: Minimal or no pain anticipated   Induction: Intravenous  PONV Risk Score and Plan: 1 and Propofol infusion and TIVA  Airway Management Planned: Natural Airway and Nasal Cannula  Additional Equipment:   Intra-op Plan:   Post-operative Plan:   Informed Consent: I have reviewed the patients History and Physical, chart, labs and discussed the procedure including the risks, benefits and alternatives for the proposed anesthesia with the patient or authorized representative who has indicated his/her understanding and acceptance.     Dental Advisory Given  Plan Discussed with: Anesthesiologist, CRNA and Surgeon  Anesthesia Plan Comments: (Patient consented for risks of anesthesia including but not limited to:  - adverse  reactions to medications - risk of airway placement if required - damage to eyes, teeth, lips or other oral mucosa - nerve damage due to positioning  - sore throat or hoarseness - Damage to heart, brain, nerves, lungs, other parts of body or loss of life  Patient voiced understanding and assent.)        Anesthesia Quick Evaluation

## 2023-07-17 ENCOUNTER — Encounter: Payer: Self-pay | Admitting: Gastroenterology

## 2023-07-18 LAB — SURGICAL PATHOLOGY

## 2023-07-18 NOTE — Anesthesia Postprocedure Evaluation (Signed)
Anesthesia Post Note  Patient: James Brown  Procedure(s) Performed: COLONOSCOPY WITH PROPOFOL BIOPSY  Patient location during evaluation: PACU Anesthesia Type: General Level of consciousness: awake and alert, oriented and patient cooperative Pain management: pain level controlled Vital Signs Assessment: post-procedure vital signs reviewed and stable Respiratory status: spontaneous breathing, nonlabored ventilation and respiratory function stable Cardiovascular status: blood pressure returned to baseline and stable Postop Assessment: adequate PO intake Anesthetic complications: no   No notable events documented.   Last Vitals:  Vitals:   07/16/23 1622 07/16/23 1642  BP: (!) 73/39 (!) 109/47  Pulse: 70   Resp: 12   Temp: 36.4 C   SpO2: 99%     Last Pain:  Vitals:   07/16/23 1642  TempSrc:   PainSc: 0-No pain                 Reed Breech

## 2023-07-19 ENCOUNTER — Encounter: Payer: Self-pay | Admitting: Gastroenterology

## 2023-07-19 ENCOUNTER — Encounter (HOSPITAL_COMMUNITY): Payer: Self-pay | Admitting: Emergency Medicine

## 2023-07-19 ENCOUNTER — Other Ambulatory Visit: Payer: Self-pay

## 2023-07-19 ENCOUNTER — Emergency Department (HOSPITAL_COMMUNITY)
Admission: EM | Admit: 2023-07-19 | Discharge: 2023-07-19 | Disposition: A | Discharge status: Home / Self Care | Payer: 59 | Attending: Emergency Medicine | Admitting: Emergency Medicine

## 2023-07-19 DIAGNOSIS — Z992 Dependence on renal dialysis: Secondary | ICD-10-CM | POA: Diagnosis not present

## 2023-07-19 DIAGNOSIS — N186 End stage renal disease: Secondary | ICD-10-CM | POA: Insufficient documentation

## 2023-07-19 DIAGNOSIS — E1122 Type 2 diabetes mellitus with diabetic chronic kidney disease: Secondary | ICD-10-CM | POA: Diagnosis not present

## 2023-07-19 DIAGNOSIS — I953 Hypotension of hemodialysis: Secondary | ICD-10-CM | POA: Insufficient documentation

## 2023-07-19 DIAGNOSIS — I120 Hypertensive chronic kidney disease with stage 5 chronic kidney disease or end stage renal disease: Secondary | ICD-10-CM | POA: Insufficient documentation

## 2023-07-19 DIAGNOSIS — R42 Dizziness and giddiness: Secondary | ICD-10-CM | POA: Diagnosis present

## 2023-07-19 LAB — BASIC METABOLIC PANEL
Anion gap: 15 (ref 5–15)
BUN: 27 mg/dL — ABNORMAL HIGH (ref 6–20)
CO2: 27 mmol/L (ref 22–32)
Calcium: 8.5 mg/dL — ABNORMAL LOW (ref 8.9–10.3)
Chloride: 92 mmol/L — ABNORMAL LOW (ref 98–111)
Creatinine, Ser: 8.48 mg/dL — ABNORMAL HIGH (ref 0.61–1.24)
GFR, Estimated: 7 mL/min — ABNORMAL LOW (ref >60)
Glucose, Bld: 95 mg/dL (ref 70–99)
Potassium: 4.2 mmol/L (ref 3.5–5.1)
Sodium: 134 mmol/L — ABNORMAL LOW (ref 135–145)

## 2023-07-19 LAB — CBG MONITORING, ED: Glucose-Capillary: 112 mg/dL — ABNORMAL HIGH (ref 70–99)

## 2023-07-19 LAB — CBC
HCT: 38.4 % — ABNORMAL LOW (ref 39.0–52.0)
Hemoglobin: 12 g/dL — ABNORMAL LOW (ref 13.0–17.0)
MCH: 25.9 pg — ABNORMAL LOW (ref 26.0–34.0)
MCHC: 31.3 g/dL (ref 30.0–36.0)
MCV: 82.8 fL (ref 80.0–100.0)
Platelets: 339 10*3/uL (ref 150–400)
RBC: 4.64 MIL/uL (ref 4.22–5.81)
RDW: 15.2 % (ref 11.5–15.5)
WBC: 16.3 10*3/uL — ABNORMAL HIGH (ref 4.0–10.5)
nRBC: 0.8 % — ABNORMAL HIGH (ref 0.0–0.2)

## 2023-07-19 MED ORDER — SODIUM CHLORIDE 0.9 % IV BOLUS
250.0000 mL | Freq: Once | INTRAVENOUS | Status: AC
  Administered 2023-07-19: 250 mL via INTRAVENOUS

## 2023-07-19 NOTE — ED Notes (Signed)
Pt states he no longer makes urine for urine sample that is ordered

## 2023-07-19 NOTE — ED Provider Notes (Addendum)
[facility]ER[facility]ENCY DEPARTMENT AT [facility] Provider Note   CSN: [id number] Arrival date & time: 07/19/23  1746     History  Chief Complaint  Patient presents with   Dizziness   Weakness    James Brown is a 58 y.o. male.  Patient had dialysis this morning.  Usually starts at around 5:30 in the morning.  They noticed blood pressure was low following dialysis they worked him for a while and then go home but he felt like his blood pressures were low at home.  Was dizzy no syncope.  Some generalized weakness.  Normally dialyzed Monday Wednesdays and Fridays.  Patient's blood pressures on arrival were 70 systolic but then without any intervention came up to around 91/66 and the most recent was 92/65.  Patient states his blood pressures normally are in the 90s.  So not too far off but he is laying still.  No chest pain no shortness of breath no upper respiratory symptoms.  No fever.  Did have a mild headache earlier but that has resolved.  Past medical history sniffer hypertension hyperlipidemia, abdominal aortic aneurysm diabetes end-stage renal disease on dialysis.  Patient is normally dialyzed in the left upper extremity.       Home Medications Prior to Admission medications   Medication Sig Start Date End Date Taking? Authorizing Provider  Calcium Acetate 667 MG TABS Take 2,001 mg by mouth 3 (three) times daily with meals.    [provider]  fenofibrate 160 MG tablet Take 160 mg by mouth daily.    [provider]  midodrine (PROAMATINE) 10 MG tablet Take 10 mg by mouth daily as needed (high blood pressure).    [provider]  multivitamin (RENA-VIT) TABS tablet Take 1 tablet by mouth at bedtime. 04/18/19   Glade Lloyd, MD  OZEMPIC, 1 MG/DOSE, 4 MG/3ML SOPN Inject 1 mg into the skin once a week.    [provider]  XPHOZAH 30 MG TABS Take 30 mg by mouth 2 (two) times daily. 05/07/23   [provider]       Allergies    Donn Pierini, ppd]    Review of Systems   Review of Systems  Constitutional:  Negative for chills and fever.  HENT:  Negative for ear pain and sore throat.   Eyes:  Negative for pain and visual disturbance.  Respiratory:  Negative for cough and shortness of breath.   Cardiovascular:  Negative for chest pain and palpitations.  Gastrointestinal:  Negative for abdominal pain and vomiting.  Genitourinary:  Negative for dysuria and hematuria.  Musculoskeletal:  Negative for arthralgias and back pain.  Skin:  Negative for color change and rash.  Neurological:  Positive for light-headedness and headaches. Negative for seizures and syncope.  All other systems reviewed and are negative.   Physical Exam Updated Vital Signs BP 92/65   Pulse 87   Temp 97.6 F (36.4 C) (Oral)   Resp 16   SpO2 100%  Physical Exam Vitals and nursing note reviewed.  Constitutional:      General: He is not in acute distress.    Appearance: Normal appearance. He is well-developed. He is not ill-appearing.  HENT:     Head: Normocephalic and atraumatic.     Mouth/Throat:     Mouth: Mucous membranes are moist.  Eyes:     Extraocular Movements: Extraocular movements intact.     Conjunctiva/sclera: Conjunctivae normal.     Pupils: Pupils are equal, round, and reactive  to light.  Cardiovascular:     Rate and Rhythm: Normal rate and regular rhythm.     Heart sounds: No murmur heard. Pulmonary:     Effort: Pulmonary effort is normal. No respiratory distress.     Breath sounds: Normal breath sounds.  Abdominal:     Palpations: Abdomen is soft.     Tenderness: There is no abdominal tenderness.  Musculoskeletal:        General: No swelling.     Cervical back: Normal range of motion and neck supple. No rigidity.     Comments: Right upper extremity with old AV fistula.  Left upper extremity with AV fistula sort of faint thrill.  But they did use it today for dialysis.  Skin:    General:  Skin is warm and dry.     Capillary Refill: Capillary refill takes less than 2 seconds.  Neurological:     General: No focal deficit present.     Mental Status: He is alert and oriented to person, place, and time.  Psychiatric:        Mood and Affect: Mood normal.     ED Results / Procedures / Treatments   Labs (all labs ordered are listed, but only abnormal results are displayed) Labs Reviewed  CBC - Abnormal; Notable for the following components:      Result Value   WBC 16.3 (*)    Hemoglobin 12.0 (*)    HCT 38.4 (*)    MCH 25.9 (*)    nRBC 0.8 (*)    All other components within normal limits  CBG MONITORING, ED - Abnormal; Notable for the following components:   Glucose-Capillary 112 (*)    All other components within normal limits  BASIC METABOLIC PANEL  URINALYSIS, ROUTINE W REFLEX MICROSCOPIC  CBG MONITORING, ED    EKG EKG Interpretation Date/Time:  Friday July 19 2023 17:55:49 EST Ventricular Rate:  82 PR Interval:  156 QRS Duration:  96 QT Interval:  426 QTC Calculation: 498 R Axis:   -40  Text Interpretation: Sinus rhythm Ventricular premature complex Left anterior fascicular block Low voltage, precordial leads Abnormal R-wave progression, early transition Consider anterior infarct Confirmed by Vanetta Mulders [phone]) on 07/19/2023 6:23:40 PM  Radiology No results found.  Procedures Procedures    Medications Ordered in ED Medications  sodium chloride 0.9 % bolus 250 mL (250 mLs Intravenous New Bag/Given 07/19/23 1833)    ED Course/ Medical Decision Making/ A&P                                 Medical Decision Making Amount and/or Complexity of Data Reviewed Labs: ordered.   Patient's states his blood pressure is normally in the 90s.  Currently were 92/65.  Think if I did get him up and did orthostatics he probably would drop.  Obviously patient will increase his fluid as time goes on.  Will give just a hint of a bolus here 250 cc normal saline  bolus.  Will observe and then reassess.  White count 16.3 hemoglobin 12 hematocrit 38.4 platelets 339 glucose 112 basic metabolic panel pending.  Oxygen saturation on room air is 100%.  Do not feel that we needed chest x-ray.  Patient blood pressure still be recorded low to have the doing on his forearm because of his AV fistulas is recorded at 83.  Was planning on doing orthostatic blood pressure checks but no sense doing that  1 is already low will give another 250 cc bolus of IV fluids and then recheck.  Patient's basic metabolic panel would be as expected.  Potassium was 4.2 which was good GFR is 7 creatinine of 8.48.  Sodium down a little bit at 134 chloride down a little bit 92 nothing unusual there.  Blood sugar was 95.  Patient's blood pressure now 96 systolic.  Also when it was actually low we did do orthostatics and actually his blood pressure went up when he stood up and he was completely asymptomatic.  But after receiving now a total of 500 cc of fluid blood pressure at baseline is doing better.  Patient is asymptomatic I think he can be discharged home.   Final Clinical Impression(s) / ED Diagnoses Final diagnoses:  Hemodialysis-associated hypotension    Rx / DC Orders ED Discharge Orders     None         Vanetta Mulders, MD 07/19/23 Jolene Provost, MD 07/19/23 1610    Vanetta Mulders, MD 07/19/23 [phone]

## 2023-07-19 NOTE — ED Notes (Signed)
Can't obtain a UA because pt hasn't produced urine in 14 years. Per pt

## 2023-07-19 NOTE — Discharge Instructions (Signed)
Follow-up with dialysis scheduled for Monday.  Return for any new or worse symptoms.

## 2023-07-19 NOTE — ED Triage Notes (Signed)
Pt BIB EMS for dizziness and generalized weakness. Finished dialysis treatment this morning, no missed appointments. CBG 140. Denies any CP or SOB.  EMS VS: BP 98/51 HR 81 98% RA

## 2023-07-19 NOTE — ED Notes (Signed)
CBG- 112

## 2023-07-19 NOTE — ED Notes (Signed)
Assumed pt care at this time

## 2023-07-23 ENCOUNTER — Other Ambulatory Visit: Payer: Self-pay

## 2023-07-23 ENCOUNTER — Encounter (HOSPITAL_COMMUNITY): Payer: Self-pay | Admitting: Nephrology

## 2023-07-23 ENCOUNTER — Ambulatory Visit (HOSPITAL_COMMUNITY)
Admission: RE | Admit: 2023-07-23 | Discharge: 2023-07-23 | Disposition: A | Discharge status: Home / Self Care | Payer: 59 | Attending: Nephrology | Admitting: Nephrology

## 2023-07-23 ENCOUNTER — Encounter (HOSPITAL_COMMUNITY)
Admission: RE | Disposition: A | Discharge status: Home / Self Care | Payer: Self-pay | Source: Home / Self Care | Attending: Nephrology

## 2023-07-23 DIAGNOSIS — E1122 Type 2 diabetes mellitus with diabetic chronic kidney disease: Secondary | ICD-10-CM | POA: Insufficient documentation

## 2023-07-23 DIAGNOSIS — T82858A Stenosis of vascular prosthetic devices, implants and grafts, initial encounter: Secondary | ICD-10-CM | POA: Diagnosis present

## 2023-07-23 DIAGNOSIS — Z7985 Long-term (current) use of injectable non-insulin antidiabetic drugs: Secondary | ICD-10-CM | POA: Insufficient documentation

## 2023-07-23 DIAGNOSIS — I120 Hypertensive chronic kidney disease with stage 5 chronic kidney disease or end stage renal disease: Secondary | ICD-10-CM | POA: Diagnosis not present

## 2023-07-23 DIAGNOSIS — Y832 Surgical operation with anastomosis, bypass or graft as the cause of abnormal reaction of the patient, or of later complication, without mention of misadventure at the time of the procedure: Secondary | ICD-10-CM | POA: Insufficient documentation

## 2023-07-23 DIAGNOSIS — Z992 Dependence on renal dialysis: Secondary | ICD-10-CM | POA: Insufficient documentation

## 2023-07-23 DIAGNOSIS — N186 End stage renal disease: Secondary | ICD-10-CM | POA: Insufficient documentation

## 2023-07-23 HISTORY — PX: A/V FISTULAGRAM: CATH118298

## 2023-07-23 HISTORY — PX: PERIPHERAL VASCULAR BALLOON ANGIOPLASTY: CATH118281

## 2023-07-23 SURGERY — A/V FISTULAGRAM
Anesthesia: LOCAL

## 2023-07-23 MED ORDER — SODIUM CHLORIDE 0.9% FLUSH: 3.0000 mL | INTRAVENOUS | Status: DC | PRN

## 2023-07-23 MED ORDER — HEPARIN (PORCINE) IN NACL 1000-0.9 UT/500ML-% IV SOLN
INTRAVENOUS | Status: DC | PRN
  Administered 2023-07-23: 500 mL

## 2023-07-23 MED ORDER — MIDAZOLAM HCL 2 MG/2ML IJ SOLN
INTRAMUSCULAR | Status: AC
  Filled 2023-07-23: qty 2

## 2023-07-23 MED ORDER — FENTANYL CITRATE (PF) 100 MCG/2ML IJ SOLN
INTRAMUSCULAR | Status: DC | PRN
  Administered 2023-07-23: 25 ug via INTRAVENOUS

## 2023-07-23 MED ORDER — IODIXANOL 320 MG/ML IV SOLN
INTRAVENOUS | Status: DC | PRN
  Administered 2023-07-23: 8 mL via INTRAVENOUS

## 2023-07-23 MED ORDER — LIDOCAINE HCL (PF) 1 % IJ SOLN
INTRAMUSCULAR | Status: AC
  Filled 2023-07-23: qty 30

## 2023-07-23 MED ORDER — LIDOCAINE HCL (PF) 1 % IJ SOLN
INTRAMUSCULAR | Status: DC | PRN
  Administered 2023-07-23: 2 mL via SUBCUTANEOUS

## 2023-07-23 MED ORDER — SODIUM CHLORIDE 0.9 % IV SOLN: INTRAVENOUS | Status: DC

## 2023-07-23 MED ORDER — MIDAZOLAM HCL 2 MG/2ML IJ SOLN
INTRAMUSCULAR | Status: DC | PRN
  Administered 2023-07-23: 1 mg via INTRAVENOUS

## 2023-07-23 MED ORDER — FENTANYL CITRATE (PF) 100 MCG/2ML IJ SOLN
INTRAMUSCULAR | Status: AC
  Filled 2023-07-23: qty 2

## 2023-07-23 MED ORDER — ONDANSETRON HCL 4 MG/2ML IJ SOLN: 4.0000 mg | Freq: Four times a day (QID) | INTRAMUSCULAR | Status: DC | PRN

## 2023-07-23 MED ORDER — ACETAMINOPHEN 325 MG PO TABS: 650.0000 mg | ORAL_TABLET | ORAL | Status: DC | PRN

## 2023-07-23 SURGICAL SUPPLY — 12 items
BAG SNAP BAND KOVER 36X36 (MISCELLANEOUS) ×2 IMPLANT
BALLN IN.PACT DCB 6X40 (BALLOONS) ×2
BALLN MUSTANG 6.0X40 75 (BALLOONS) ×2
BALLOON MUSTANG 6.0X40 75 (BALLOONS) IMPLANT
CATH ANGIO 5F BER2 65CM (CATHETERS) IMPLANT
COVER DOME SNAP 22 D (MISCELLANEOUS) ×2 IMPLANT
DCB IN.PACT 6X40 (BALLOONS) IMPLANT
GLIDEWIRE ANGLED SS 035X260CM (WIRE) IMPLANT
GUIDEWIRE ANGLED .035X150CM (WIRE) IMPLANT
SHEATH PINNACLE R/O II 6F 4CM (SHEATH) IMPLANT
SYR MEDALLION 10ML (SYRINGE) IMPLANT
TRAY PV CATH (CUSTOM PROCEDURE TRAY) ×2 IMPLANT

## 2023-07-23 NOTE — Op Note (Addendum)
Patient presents with decreased access flows in his left BCF with last flow 5 mm PTA on 07/09/23.   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   Summary:  1)      The patient had successful angioplasty 6x4 Mustang FE ~10 atm) of significant stenosis in the arterial + juxta anastomosis + 6x4 Impact drug eluting  balloon FE for 2 minutes.  2)      The body of the cephalic vein fistula with 2 large arterial limb aneurysms, body of the cephalic, arch were patent with much improved flows after inflow angioplasty.   The problem was the aneurysms are so large and it was difficult to manipulate the wire into a normal/stenotic calibered juxta segment even with the aid of a Bernstein catheter. Marland Kitchen  3)      The centrals were widely patent. 4)      This left BCF remains amenable to future percutaneous intervention.   Description of procedure: The arm was prepped and draped in the usual sterile fashion. The left upper arm brachial cephalic fistula was cannulated (32440) with an 18G Angiocath needle directed in a retrograde direction. A guidewire was inserted and exchanged for a 6Fr sheath. Contrast [phone]) injection via the side port of the sheath was performed. The angiogram of the fistula (53664) showed an aneurysmal area limb, body of the left BCF and cephalic vein arch are patent.   With great difficulty  the wire was advanced in the retrograde direction past the aneurysms, inflow anastomosis and finally into the proximal brachial artery .  Arteriogram was performed from the proximal brachial artery revealing a patent brachial artery, anastomosis with a 50-70% juxta anastomotic cephalic vein stenosis.   A  6x4 Mustang angioplasty balloon was inserted over a glide wire and positioned at the inflow cephalic vein + arterial anastomosis stenosis. Arterial + venous angioplasty (40347) was carried out to 10-12 ATM  with FULL effacement of the waist on the balloon.   We decided to use a drug-eluting balloon because the patient was just here 2 weeks ago and the lesion is already back.   A 0.035 hydrophilic 260 cm glidewire was then inserted through the sheath and parked in the axillary artery. A 6x4  Impact DEB angioplasty balloon was then inserted over the guidewire and positioned at the cephalic vein inflow anastomotic + juxta site stenosis.   Arterial and venous angioplasty (42595) was carried out to 8 ATM with FULL effacement of the waist on the balloon at the basilic outflow swing site lesion for 2 minutes. The repeat angiogram showed 10% residual stenosis at the outflow basilic swing site  with no evidence of extravasation or dissection. Repeat angiogram showed 10% residual at the site of angioplasty with no evidence of extravasation.  The flow of contrast was quicker and the symptoms along the inflow limb were now filling much better .   Hemostasis: A 3-0 ethilon purse string suture was placed at the cannulation site on removal of the sheath.   Sedation: 1 mg Versed, 25 mcg Fentanyl. Sedation time. 26 minutes   Contrast.  8 mL   Monitoring: Because of the patient's comorbid conditions and sedation during the procedure, continuous EKG monitoring and O2 saturation monitoring was performed throughout the procedure by the RN. There were no abnormal arrhythmias encountered.   Complications: None.    Diagnoses: I87.1 Stricture of vein  N18.6 ESRD T82.858A  Stricture of access   Procedure Coding:  [phone] Cannulation and angiogram of fistula, venous angioplasty (cephalic vein arch) U0454 Contrast   Recommendations:  1. Continue to cannulate the fistula with 15G needles.  2. Refer back for problems with flows. 3. Remove the suture next treatment.    Discharge: The patient was discharged home in stable condition. The patient was given education regarding the care of the dialysis access AVF and  specific instructions in case of any problems.

## 2023-07-23 NOTE — H&P (Signed)
Interval H&P  The patient has presented today for an angiogram/ angioplasty; patient was just here with inflow 5mm PTA and unfortunately there are still decreased flows.   Various methods of treatment have been discussed with the patient.  After consideration of risk, benefits and other options for treatment, the patient has consented to a angiogram/ angioplasty with  possible stent placement.   Risks of angiogram with potential angioplasty and stenting if needed.contrast reaction, extravasation/ bleeding, dissection, hypotension and death were explained to the patient.  The patient's history has been reviewed and the patient has been examined, no changes in status.  Stable for angiogram/angioplasty  I have reviewed the patient's chart and labs.  Questions were answered to the patient's satisfaction.

## 2023-07-23 NOTE — Discharge Instructions (Signed)

## 2024-02-06 NOTE — Telephone Encounter (Signed)
 Spoke with patient surgery scheduled with Dr.Hemal 03/12/2024 at [facility] campus  Surgery letter mailed

## 2024-07-02 NOTE — Progress Notes (Signed)
 " Subjective:  Chief Complaint:  A. S/P Bilateral Nephrectomy. B. ED      Patient ID: James Brown is a 59 y.o. male.    Dear Dr. Alla,   Thank you for allowing us  to see James Brown in consultation at [facility]. I have personally reviewed the medical record, including investigations, medical and surgical history, office notes, laboratory results, and imaging studies, as available and appropriate. I have personally interviewed and examined the patient with the pertinent findings detailed below for our records.    History of Present Illness:  [name] is a 59 y.o. male who with a PMH that includes ESRD, renal cell carcinoma, diabetes type 2, hypertension, hyperlipidemia, OSA, lung nodules, anemia, hypotension, dialysis, hyperparathyroidism   Renal mass s/p right radical nephrectomy 12 March 2024: Clear-cell renal cell carcinoma, grade 2, tumor extends into perinephric tissue, negative margins, T3a.   The patient denies gross hematuria, flank pain, bone pain, dysuria, UTIs, fevers, genital pain, or passed kidney stones. Patient also has not had any abdominal pain, nausea, vomiting, diarrhea/constipation, unintentional weight loss, or night sweats.     The following portions of the patient's history were reviewed and updated as appropriate: allergies, current medications, past medical and surgical history, social history, family history, and the Problem List.     EMR reviewed: Yes.  Care Everywhere reviewed for external records: Yes.    Past Medical/Surgical History:  Medical History[1]  Surgical History[2]    Medications:  Medications Ordered Prior to Encounter[3]    Social History:  Social Connections: Not on file      Family History:  Family History[4]    Review of Systems:   Negative, as per HPI.    Objective:  Physical Exam  BP (!) 110/59   Pulse 101   Temp 97.8 F (36.6 C) (Temporal)   Ht 1.702  m (5' 7)   Wt 91.2 kg (201 lb 1 oz)   SpO2 100%   BMI 31.49 kg/m     General: Resting comfortably, no apparent distress.   HEENT: Normocephalic, atraumatic  Cardiovascular: Hemodynamically stable  Pulmonary: Non-labored respirations on room air  Abdomen: Soft, non-tender, non-distended, no appreciable guarding  Genitourinary: Deferred  Extremities: Moves all extremities  Neurological: Alert and oriented    Pertinent Labs:  Results for orders placed or performed during the hospital encounter of 03/12/24  POC Glucose   Collection Time: 03/12/24  2:51 PM  Result Value Ref Range   Glucose, POC 83 70 - 99 mg/dL  Potassium, Whole Blood   Collection Time: 03/12/24  3:01 PM  Result Value Ref Range   Potassium 6.2 (HH) 3.4 - 4.5 mmol/L   Hemolysis Grading No Visible Hemolysis   Potassium, Whole Blood   Collection Time: 03/12/24  3:26 PM  Result Value Ref Range   Potassium 4.5 3.4 - 4.5 mmol/L  Blood Gas, Arterial   Collection Time: 03/12/24  7:30 PM  Result Value Ref Range   pH, Blood Gas 7.303 (L) 7.350 - 7.450   pCO2, Arterial 48.2 (H) 35.0 - 48.0 mm Hg   pO2, Arterial 174.0 (H) 83.0 - 108.0 mm Hg   HCO3, Blood Gas 24 21 - 28 mmol/L   Base Deficit (-) / Base Excess -2.5 (L) -2.0 - 2.0 mmol/L   O2 Saturation, Arterial (measured) 99.0 (H) 94.0 - 98.0 %   Source, Blood Gas Arterial    Comment    Lactate, Whole Blood   Collection Time: 03/12/24  7:30 PM  Result Value Ref Range   Lactate 1.20 0.50 - 2.20 mmol/L   Comment    TOTAL HEMOGLOBIN   Collection Time: 03/12/24  7:30 PM  Result Value Ref Range   Total Hemoglobin 9.8 (L) 14.0 - 18.0 g/dL   Comment    Potassium, Whole Blood   Collection Time: 03/12/24  7:30 PM  Result Value Ref Range   Potassium 6.0 (H) 3.4 - 4.5 mmol/L   Comment     Hemolysis Grading No Visible Hemolysis   Sodium, Whole Blood   Collection Time: 03/12/24  7:30 PM  Result Value Ref Range   Sodium 138 136 - 145 mmol/L   Comment    Glucose, Whole  Blood   Collection Time: 03/12/24  7:30 PM  Result Value Ref Range   Glucose 172 (H) 74 - 100 mg/dL   Comment    Ionized Calcium , Whole Blood   Collection Time: 03/12/24  7:30 PM  Result Value Ref Range   Ionized Calcium  0.95 (L) 1.15 - 1.33 mmol/L   Comment    Tissue Exam   Collection Time: 03/12/24  8:17 PM  Result Value Ref Range   Case Report      Surgical Pathology Report                         Case: [id number]                              Authorizing Provider:  Ranell Von Constable, MD      Collected:           03/12/2024 2017             Ordering Location:     [facility]  Received:            03/12/2024 2051                                    [facility]                                                                           ROOM                                                                        Pathologist:           Kara Christina Orem, MD                                                    Specimen:    Kidney, Right, right kidney, OR 18, JJ/TW  Final Diagnosis       RIGHT KIDNEY, RIGHT RADICAL NEPHRECTOMY: Clear cell renal cell carcinoma, grade 2 of 4. Tumor size:  2.1 cm. Tumor focally extends into perinephric tissue. Margins negative for malignancy. Changes consistent with history of end-stage renal disease and dialysis. Pathologic stage:  pT3a. See tumor protocol summary below. See comment.     Comment      The renal mass shows clear cell morphology with areas of eosinophilia.  The tumor is arranged in alveoli, and clusters with cyst formation.  There is patchy hemorrhage and hemosiderin deposition.  Immunohistochemistry shows tumor with strong membranous positivity for CAIX, positivity for CD10, patchy positivity for CK7 and racemase.  Immunoreactive pattern and histomorphology favors clear cell renal cell carcinoma.  Selected slides are additionally reviewed by Dr. Bernardino Means who  agrees with presence of renal cell carcinoma.  Immunohistochemical positive controls stained appropriately.    Synoptic Checklist      KIDNEY: Nephrectomy  KIDNEY: NEPHRECTOMY - All Specimens  8th Edition - Protocol posted: 12/12/2022    SPECIMEN     Procedure:    Radical nephrectomy     Specimen Laterality:    Right    TUMOR     Tumor Focality:    Unifocal     Tumor Size:    Greatest Dimension (Centimeters): 2.1 cm     Histologic Type:    Clear cell renal cell carcinoma     Histologic Grade (WHO / ISUP):    G2, nucleoli conspicuous and visible at 400x magnification, not prominent at 100x magnification     Tumor Extent:    Extends into perinephric tissue (beyond renal capsule)     Histologic Features:    Sarcomatoid or rhabdoid features not identified     Tumor Necrosis:    Not identified     Lymphatic and / or Vascular Invasion:    Not identified    MARGINS     Margin Status:    All margins negative for invasive carcinoma    REGIONAL LYMPH NODES     Regional Lymph Node Status:    Not applicable (no regional lymph nodes submitted or found)    pTNM CLASSIFICATION (AJCC 8th Edition)     Reporting of pT, pN, and (when applicable) pM categories is based on information available to the pathologist at the time the report is issued. As per the AJCC (Chapter 1, 8th Ed.) it is the managing physician's responsibility to establish the final pathologic stage based upon all pertinent information, including but potentially not limited to this pathology report.     pT Category:    pT3a     pN Category:    pN not assigned (no nodes submitted or found)    ADDITIONAL FINDINGS     Additional Findings in Kidney:    Glomerular disease: Glomerulosclerosis     Additional Findings in Kidney:    Tubulointerstitial disease: Tubular atrophy with thyroidization     Additional Findings in Kidney:    Vascular disease: Calcific medial arteriosclerosis; intimal hyperplasia     Additional Findings in Kidney:     Cyst(s): Multiple cysts with focally associated with oxalate crystals, favor acquired cystic kidney disease       Comment(s):    Blocks with representative tumor: A2, A3, A4, A5    Gross Description      A. Received fresh and subsequently placed in formalin labeled with the patient's name, medical record number, and right kidney is a 338 g,  14.8 x 11.5 x 5.6 cm radical nephrectomy specimen with a 8.5 x 0.4 cm of attached ureter, 2.0 x 1.0 cm of attached renal vein, and 1.8 x 0.4 cm of attached renal artery.  The kidney proper is 8.5 x 6.0 x 5.5 cm and is surfaced by yellow, lobulated adipose tissue and a 7.5 x 5.0 cm portion of Gerota's fascia is present.  The specimen is bivalved to reveal a diffusely cystic cut surface.  Sectioning reveals a 2.1 x 2.0 x 1.9 cm circumscribed tan-brown to yellow, hemorrhagic mass located in the superior pole, 4.8 cm from the vascular margins, and 8.5 cm from the ureter margin.  The mass does not involve the renal sinus fat and is confined to the kidney capsule.  The remainder of the kidney parenchyma is diffusely cystic and no well-defined corticomedullary junctions are identified.  A digital image is taken using Spot imaging and representative sections are submitted as follows:  BLOCK SUMMARY: A1: Ureter and vascular margins, en face A2: Mass to kidney capsule A3-A5: Representative mass A6: Kidney parenchyma to include cysts.  M. Honeycutt, PA (ASCP) 03/13/2024, 9:21 AM  All specimens are formalin-processed and paraffin-embedded unless otherwise noted.     Clinical Information      Malignant neoplasm of right kidney (CMD) [C64.1].  Per electronic record, right-sided renal mass; history of left radical nephrectomy 12/2015 with clear cell renal cell carcinoma; history of end-stage renal disease on dialysis; now robotic-assisted laparoscopic radical right-sided nephrectomy (adrenal sparing).    Disclaimer      Unless otherwise noted as FDA-approved, these tests  were developed and their performance characteristics determined by [facility]. They have not been cleared or approved by the U.S. Food and Drug Administration. The FDA has determined that such clearance or approval is not necessary.  These tests are used for clinical purposes. They should not be regarded as investigational or for research. This laboratory is certified under the Clinical Laboratory Improvement Amendments of 1988 (CLIA) as qualified to perform high complexity clinical laboratory testing.    Point of Service      [facility] [place], [place] [id number], [facility], [place] [address]   POC Glucose   Collection Time: 03/12/24  8:44 PM  Result Value Ref Range   Glucose, POC 204 (H) 70 - 99 mg/dL  Blood Gas, Arterial   Collection Time: 03/12/24  8:49 PM  Result Value Ref Range   pH, Blood Gas 7.288 (L) 7.350 - 7.450   pCO2, Arterial 45.0 35.0 - 48.0 mm Hg   pO2, Arterial 192.0 (H) 83.0 - 108.0 mm Hg   HCO3, Blood Gas 22 21 - 28 mmol/L   Base Deficit (-) / Base Excess -5.1 (L) -2.0 - 2.0 mmol/L   O2 Saturation, Arterial (measured) 98.8 (H) 94.0 - 98.0 %   Source, Blood Gas Arterial   Lactate, Whole Blood   Collection Time: 03/12/24  8:49 PM  Result Value Ref Range   Lactate 1.50 0.50 - 2.20 mmol/L  TOTAL HEMOGLOBIN   Collection Time: 03/12/24  8:49 PM  Result Value Ref Range   Total Hemoglobin 9.0 (L) 14.0 - 18.0 g/dL  Potassium, Whole Blood   Collection Time: 03/12/24  8:49 PM  Result Value Ref Range   Potassium 4.8 (H) 3.4 - 4.5 mmol/L  Sodium, Whole Blood   Collection Time: 03/12/24  8:49 PM  Result Value Ref Range   Sodium 139 136 - 145 mmol/L  Glucose, Whole Blood   Collection Time: 03/12/24  8:49 PM  Result Value Ref Range   Glucose 225 (H) 74 - 100 mg/dL  Ionized Calcium , Whole Blood   Collection Time: 03/12/24  8:49 PM  Result Value Ref Range   Ionized Calcium  1.00 (L)  1.15 - 1.33 mmol/L  Basic Metabolic Panel   Collection Time: 03/12/24 10:28 PM  Result Value Ref Range   Sodium 137 136 - 145 mmol/L   Potassium 5.2 (H) 3.5 - 5.1 mmol/L   Chloride 98 98 - 107 mmol/L   CO2 23 21 - 31 mmol/L   Anion Gap 16 (H) 6 - 14 mmol/L   Glucose, Random 175 (H) 70 - 99 mg/dL   Blood Urea Nitrogen (BUN) 61 (H) 7 - 25 mg/dL   Creatinine 88.81 (H) 0.70 - 1.30 mg/dL   eGFR 5 (L) >40 fO/fpw/8.26f7   Calcium  7.5 (L) 8.6 - 10.3 mg/dL   BUN/Creatinine Ratio 5.5 (L) 10.0 - 20.0  Magnesium   Collection Time: 03/12/24 10:28 PM  Result Value Ref Range   Magnesium 2.3 1.9 - 2.7 mg/dL  CBC with Differential   Collection Time: 03/12/24 10:28 PM  Result Value Ref Range   WBC 14.20 (H) 4.40 - 11.00 10*3/uL   RBC 3.58 (L) 4.50 - 5.90 10*6/uL   Hemoglobin 8.8 (L) 14.0 - 17.5 g/dL   Hematocrit 72.5 (L) 58.4 - 50.4 %   Mean Corpuscular Volume (MCV) 76.4 (L) 80.0 - 96.0 fL   Mean Corpuscular Hemoglobin (MCH) 24.4 (L) 27.5 - 33.2 pg   Mean Corpuscular Hemoglobin Conc (MCHC) 32.0 (L) 33.0 - 37.0 g/dL   Red Cell Distribution Width (RDW) 14.8 12.3 - 17.0 %   Platelet Count (PLT) 154 150 - 450 10*3/uL   Mean Platelet Volume (MPV) 10.8 (H) 6.8 - 10.2 fL   Neutrophils % 94 %   Lymphocytes % 4 %   Monocytes % 1 %   Eosinophils % 0 %   Basophils % 1 %   nRBC % 0 %   Neutrophils Absolute 13.40 (H) 1.80 - 7.80 10*3/uL   Lymphocytes # 0.60 (L) 1.00 - 4.80 10*3/uL   Monocytes # 0.20 0.00 - 0.80 10*3/uL   Eosinophils # 0.00 0.00 - 0.50 10*3/uL   Basophils # 0.10 0.00 - 0.20 10*3/uL   nRBC Absolute 0.00 <=0.00 10*3/uL  Magnesium   Collection Time: 03/13/24  6:36 AM  Result Value Ref Range   Magnesium 2.1 1.9 - 2.7 mg/dL  Basic Metabolic Panel   Collection Time: 03/13/24  6:36 AM  Result Value Ref Range   Sodium 133 (L) 136 - 145 mmol/L   Potassium 6.3 (HH) 3.5 - 5.1 mmol/L   Chloride 96 (L) 98 - 107 mmol/L   CO2 24 21 - 31 mmol/L   Anion Gap 13 6 - 14 mmol/L   Glucose, Random  148 (H) 70 - 99 mg/dL   Blood Urea Nitrogen (BUN) 69 (H) 7 - 25 mg/dL   Creatinine 87.73 (H) 0.70 - 1.30 mg/dL   eGFR 4 (L) >40 fO/fpw/8.26f7   Calcium  7.5 (L) 8.6 - 10.3 mg/dL   BUN/Creatinine Ratio 5.6 (L) 10.0 - 20.0  Phosphorus   Collection Time: 03/13/24  6:36 AM  Result Value Ref Range   Phosphorus 7.1 (H) 2.5 - 5.0 mg/dL  POC Glucose   Collection Time: 03/13/24  8:15 AM  Result Value Ref Range   Glucose, POC 174 (H) 70 - 99 mg/dL  POC Glucose  Collection Time: 03/13/24  9:10 AM  Result Value Ref Range   Glucose, POC 278 (H) 70 - 99 mg/dL  Potassium   Collection Time: 03/13/24  9:53 AM  Result Value Ref Range   Potassium 4.5 3.5 - 5.1 mmol/L  Hepatitis Profile   Collection Time: 03/13/24 10:05 AM  Result Value Ref Range   Hepatitis B Surface Antigen Screen, Qualitative Non-Reactive Non-Reactive   Hepatitis B Surface Antibody, Qualitative REACTIVE (EQUIVALENT TO >=10 mIU/mL) (A) NONREACTIVE (EQUIVALENT TO <10 mIU/ml)   Hepatitis B Core Antibody, Total Non-Reactive Non-Reactive   Hepatitis A Virus Antibody, Total Reactive (A) Non-Reactive   Hepatitis C Virus Antibody Non-Reactive Non-Reactive   Hepatitis Interpretation    Hepatitis A Virus (HAV) Antibody, IgM   Collection Time: 03/13/24 10:05 AM  Result Value Ref Range   Hepatitis A Antibody, IgM Non-Reactive Non-Reactive  POC Glucose   Collection Time: 03/13/24 10:16 AM  Result Value Ref Range   Glucose, POC 168 (H) 70 - 99 mg/dL  POC Glucose   Collection Time: 03/13/24 11:18 AM  Result Value Ref Range   Glucose, POC 115 (H) 70 - 99 mg/dL  POC Glucose   Collection Time: 03/13/24 12:17 PM  Result Value Ref Range   Glucose, POC 104 (H) 70 - 99 mg/dL  POC Glucose   Collection Time: 03/13/24  1:19 PM  Result Value Ref Range   Glucose, POC 95 70 - 99 mg/dL  Basic Metabolic Panel   Collection Time: 03/13/24 10:56 PM  Result Value Ref Range   Sodium 137 136 - 145 mmol/L   Potassium 4.1 3.5 - 5.1 mmol/L    Chloride 95 (L) 98 - 107 mmol/L   CO2 29 21 - 31 mmol/L   Anion Gap 13 6 - 14 mmol/L   Glucose, Random 126 (H) 70 - 99 mg/dL   Blood Urea Nitrogen (BUN) 36 (H) 7 - 25 mg/dL   Creatinine 1.99 (H) 9.29 - 1.30 mg/dL   eGFR 7 (L) >40 fO/fpw/8.26f7   Calcium  8.6 8.6 - 10.3 mg/dL   BUN/Creatinine Ratio 4.5 (L) 10.0 - 20.0  CBC with Differential   Collection Time: 03/13/24 10:56 PM  Result Value Ref Range   WBC 13.30 (H) 4.40 - 11.00 10*3/uL   RBC 3.45 (L) 4.50 - 5.90 10*6/uL   Hemoglobin 8.7 (L) 14.0 - 17.5 g/dL   Hematocrit 73.6 (L) 58.4 - 50.4 %   Mean Corpuscular Volume (MCV) 76.2 (L) 80.0 - 96.0 fL   Mean Corpuscular Hemoglobin (MCH) 25.1 (L) 27.5 - 33.2 pg   Mean Corpuscular Hemoglobin Conc (MCHC) 33.0 33.0 - 37.0 g/dL   Red Cell Distribution Width (RDW) 15.0 12.3 - 17.0 %   Platelet Count (PLT) 158 150 - 450 10*3/uL   Mean Platelet Volume (MPV) 9.1 6.8 - 10.2 fL   Neutrophils % 87 %   Lymphocytes % 6 %   Monocytes % 6 %   Eosinophils % 0 %   Basophils % 0 %   nRBC % 0 %   Neutrophils Absolute 11.60 (H) 1.80 - 7.80 10*3/uL   Lymphocytes # 0.90 (L) 1.00 - 4.80 10*3/uL   Monocytes # 0.80 0.00 - 0.80 10*3/uL   Eosinophils # 0.00 0.00 - 0.50 10*3/uL   Basophils # 0.00 0.00 - 0.20 10*3/uL   nRBC Absolute 0.00 <=0.00 10*3/uL  Magnesium   Collection Time: 03/14/24  6:01 AM  Result Value Ref Range   Magnesium 2.1 1.9 - 2.7 mg/dL  Pertinent Imaging:  MRI Abdomen W And WO Contrast Result Date: 06/30/2024 MRI ABDOMEN W AND WO CONTRAST, 06/30/2024 8:20 AM INDICATION: Kidney cancer, post nephrectomy, monitor, Malignant neoplasm of right kidney, except renal pelvis (CMD) \ C64.1 Malignant neoplasm of right kidney, except renal pelvis (CMD) ADDITIONAL HISTORY: Right clear cell RCC status post nephrectomy 03/12/2024 COMPARISON: 02/29/2024 MR abdomen TECHNIQUE: Multi-planar, multi-sequence MR images of the abdomen were obtained . FINDINGS: . Lower Chest: Within normal limits. .  Liver: Mild  hepatic steatosis. .  Gallbladder/Biliary: Within normal limits. .  Spleen: Within normal limits. .  Pancreas: Within normal limits. .  Adrenals: Stable subcentimeter nodularity of the posterior limb of the right adrenal gland with signal dropout on out of phase imaging (series 9, image 32). .  Kidneys: Interval right nephrectomy, with residual intrinsically high T1, low T2, nonenhancing tissue in the resection site, consistent with postoperative changes/scarring. No suspicious enhancing tissue in the right nephrectomy site. Left nephrectomy without suspicious enhancing tissue within the resection site. .  Peritoneum/Mesenteries/Extraperitoneum: No significant ascites or lymphadenopathy. .  Gastrointestinal tract: No evidence of obstruction. Colonic diverticulosis. .  Vascular: Within normal limits. .  Musculoskeletal: No suspicious osseous lesions within the visualized abdomen. Moderate canal stenosis at L4-L5 secondary to posterior disc bulge, facet arthropathy, and ligamentum flavum hypertrophy. .  Other: N/A   1.  Postoperative changes of right nephrectomy without evidence of residual neoplasm. 2.  Left nephrectomy without evidence of local recurrence. 3.  No metastatic disease in the visualized abdomen. 4.  Similar chronic ancillary findings.        I have reviewed the relevant imaging listed above and discussed the findings with the patient, as appropriate.    Assessment:  59 y.o. male hx including  Status post bilateral radical nephrectomy ESRD on dialysis  Problem List Items Addressed This Visit   None Visit Diagnoses       Renal cell carcinoma of right kidney    (CMD)    -  Primary   Relevant Orders   MRI Abdomen W And WO Contrast   XR Chest 2 Views          Plan:  The level of complexity in medical decision making was addressed in context of above assessment. I have discussed the above clinical data and management plan as follows:    A. S/P Bilateral radical  nephrectomy- Patient is keen to undergo kidney transplant Suggested to follow-up with nephrology and renal transplant From urology perspective he can do MRI scan of abdomen and chest x-ray in 1 year if   B. ED   Discussed management Suggested to take PDE 5 inhibitor He already has medication and he will try He will call us  if further help is required    I have recommended that the patient regularly follow-up with their primary care physician and other physicians as indicated. However, the patient will contact us  if any urologic problems arise in the interim period. The patient voiced understanding and all questions were answered.     Electronically signed by: Electronically signed by:  Ranell Von Constable, MD 07/02/2024 2:09 PM        [1] Past Medical History: Diagnosis Date   Diabetes mellitus (CMD)    ESRD (end stage renal disease) on dialysis    (CMD) 10/31/2015   HLD (hyperlipidemia) 10/31/2015   Hypertension    Malignant neoplasm of left kidney    (CMD) 11/22/2016   OSA (obstructive sleep apnea)   [2] Past Surgical History:  Procedure Laterality Date   AV FISTULA PLACEMENT Right    Procedure: AV FISTULA PLACEMENT; Placed in right arm   NEPHRECTOMY Right 03/12/2024   Right radical NEPHRECTOMY LAPAROSCOPIC ROBOTIC XI in a patient on Dialysis performed by Ranell Von Constable, MD at [facility]   NEPHRECTOMY RADICAL Left 01/20/2016   Procedure: NEPHRECTOMY RADICAL ROBOTIC / POSSIBLE OPEN ;  Surgeon: Dawne JAYSON Bigness, MD;  Location: [facility] MAIN OR;  Service: Urology;  Laterality: Left;   OTHER SURGICAL HISTORY Left    Procedure: OTHER SURGICAL HISTORY (dialysis access)   PARATHYROIDECTOMY     Procedure: PARATHYROIDECTOMY  [3] Current Outpatient Medications on File Prior to Visit  Medication Sig Dispense Refill   atorvastatin  (LIPITOR) 80 mg tablet Take 80 mg by mouth nightly.     calcium  acetate (PHOSLO ) 667 mg (169 mg calcium ) capsule Take 1,334 mg by mouth in the  morning and 1,334 mg at noon and 1,334 mg in the evening. Take with meals.     cyanocobalamin (VITAMIN B12) 2,500 mcg tab Take 2,500 mcg by mouth daily.     fenofibrate  (LOFIBRA) 160 mg tablet Take 160 mg by mouth daily.     Lokelma  10 gram pwpk packet Take 1 packet by mouth.     omega 3-dha-epa-fish oil 1,200 (144-216) mg cap Take 2,400 mg by mouth daily.     Rena-Vite 0.8 mg tab tablet Take 1 tablet by mouth daily.  6   semaglutide (OZEMPIC) 1 mg/dose (4 mg/3 mL) subcutaneous pen injector Inject 1 mg under the skin every 7 days.     sildenafiL (VIAGRA) 100 mg tablet Take 100 mg by mouth daily as needed for erectile dysfunction for up to 5 doses. 10 tablet 5   midodrine  (PROAMATINE ) 10 mg tablet Take 1 tablet by mouth. (Patient not taking: Reported on 07/02/2024)     [DISCONTINUED] oxyCODONE  (ROXICODONE ) 5 mg immediate release tablet Take 1 tablet (5 mg total) by mouth every 6 (six) hours as needed for moderate pain (4-6). 15 tablet 0   No current facility-administered medications on file prior to visit.  [4] Family History Adopted: Yes  "

## 2024-10-22 ENCOUNTER — Ambulatory Visit: Admitting: "Endocrinology
